# Patient Record
Sex: Female | Born: 1970 | Race: White | Hispanic: No | Smoking: Never smoker
Health system: Southern US, Community
[De-identification: ages and names within clinical notes are randomized; demographics above are authoritative.]

## PROBLEM LIST (undated history)

## (undated) DIAGNOSIS — R87629 Unspecified abnormal cytological findings in specimens from vagina: Secondary | ICD-10-CM

## (undated) DIAGNOSIS — G4730 Sleep apnea, unspecified: Secondary | ICD-10-CM

## (undated) DIAGNOSIS — J45909 Unspecified asthma, uncomplicated: Secondary | ICD-10-CM

## (undated) DIAGNOSIS — F419 Anxiety disorder, unspecified: Secondary | ICD-10-CM

## (undated) DIAGNOSIS — B20 Human immunodeficiency virus [HIV] disease: Secondary | ICD-10-CM

## (undated) DIAGNOSIS — D649 Anemia, unspecified: Secondary | ICD-10-CM

## (undated) DIAGNOSIS — F329 Major depressive disorder, single episode, unspecified: Secondary | ICD-10-CM

## (undated) DIAGNOSIS — A6000 Herpesviral infection of urogenital system, unspecified: Secondary | ICD-10-CM

## (undated) DIAGNOSIS — K219 Gastro-esophageal reflux disease without esophagitis: Secondary | ICD-10-CM

## (undated) DIAGNOSIS — I10 Essential (primary) hypertension: Secondary | ICD-10-CM

## (undated) DIAGNOSIS — F32A Depression, unspecified: Secondary | ICD-10-CM

## (undated) DIAGNOSIS — F1910 Other psychoactive substance abuse, uncomplicated: Secondary | ICD-10-CM

## (undated) HISTORY — DX: Essential (primary) hypertension: I10

## (undated) HISTORY — DX: Major depressive disorder, single episode, unspecified: F32.9

## (undated) HISTORY — DX: Gastro-esophageal reflux disease without esophagitis: K21.9

## (undated) HISTORY — DX: Unspecified abnormal cytological findings in specimens from vagina: R87.629

## (undated) HISTORY — DX: Other psychoactive substance abuse, uncomplicated: F19.10

## (undated) HISTORY — DX: Anemia, unspecified: D64.9

## (undated) HISTORY — DX: Unspecified asthma, uncomplicated: J45.909

## (undated) HISTORY — DX: Herpesviral infection of urogenital system, unspecified: A60.00

## (undated) HISTORY — DX: Sleep apnea, unspecified: G47.30

## (undated) HISTORY — PX: ABDOMINAL HYSTERECTOMY: SHX81

## (undated) HISTORY — DX: Anxiety disorder, unspecified: F41.9

## (undated) HISTORY — DX: Depression, unspecified: F32.A

## (undated) HISTORY — DX: Human immunodeficiency virus (HIV) disease: B20

---

## 2006-03-01 DIAGNOSIS — B20 Human immunodeficiency virus [HIV] disease: Secondary | ICD-10-CM

## 2006-03-01 DIAGNOSIS — Z21 Asymptomatic human immunodeficiency virus [HIV] infection status: Secondary | ICD-10-CM

## 2006-03-01 HISTORY — DX: Asymptomatic human immunodeficiency virus (hiv) infection status: Z21

## 2006-03-01 HISTORY — DX: Human immunodeficiency virus (HIV) disease: B20

## 2015-03-02 HISTORY — PX: HERNIA REPAIR: SHX51

## 2016-04-05 ENCOUNTER — Encounter: Payer: Self-pay | Admitting: Family Medicine

## 2016-04-05 ENCOUNTER — Ambulatory Visit (INDEPENDENT_AMBULATORY_CARE_PROVIDER_SITE_OTHER): Payer: Managed Care, Other (non HMO) | Admitting: Family Medicine

## 2016-04-05 VITALS — BP 122/82 | HR 88 | Temp 97.9°F | Resp 20 | Ht 62.75 in | Wt 187.0 lb

## 2016-04-05 DIAGNOSIS — A6004 Herpesviral vulvovaginitis: Secondary | ICD-10-CM | POA: Diagnosis not present

## 2016-04-05 DIAGNOSIS — B20 Human immunodeficiency virus [HIV] disease: Secondary | ICD-10-CM

## 2016-04-05 DIAGNOSIS — I10 Essential (primary) hypertension: Secondary | ICD-10-CM | POA: Diagnosis not present

## 2016-04-05 DIAGNOSIS — Z7689 Persons encountering health services in other specified circumstances: Secondary | ICD-10-CM

## 2016-04-05 DIAGNOSIS — J449 Chronic obstructive pulmonary disease, unspecified: Secondary | ICD-10-CM

## 2016-04-05 DIAGNOSIS — K219 Gastro-esophageal reflux disease without esophagitis: Secondary | ICD-10-CM | POA: Diagnosis not present

## 2016-04-05 DIAGNOSIS — F5104 Psychophysiologic insomnia: Secondary | ICD-10-CM | POA: Insufficient documentation

## 2016-04-05 DIAGNOSIS — F339 Major depressive disorder, recurrent, unspecified: Secondary | ICD-10-CM | POA: Insufficient documentation

## 2016-04-05 HISTORY — DX: Chronic obstructive pulmonary disease, unspecified: J44.9

## 2016-04-05 NOTE — Patient Instructions (Addendum)
Need old records Need to refer to infectious disease Need to refer to psychiatry Walk every day See me in a month

## 2016-04-05 NOTE — Progress Notes (Signed)
Chief Complaint  Patient presents with  . Establish Care   Patient is new to establish. Moved to the [place] area 2 weeks ago. No old records available.  Patient is HIV positive. 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 She states that she has gone through rehabilitation and has not had any cocaine or illicit drugs for 7 years. She had a hernia surgery repair in October 2017, and was not prescribed her antiviral medications upon discharge. Even though she knows she needs to stay on these medicines, she has not had her Complera since that time. She states because of stress with her marriage, and she is recently left her husband. She agrees to see an infectious disease specialist and be placed back on her medication at this time. She has a history of depression and anxiety. She takes Lexapro 10 mg a day. She states she also has prescribed Xanax that she tries not to take. I explained to her that Xanax was risky medication for a recovering addict and advise her not to take this. She also states she takes Ambien, 20 mg at night to help her sleep. I have advised her to discontinue this medication as well. She states that she continues to have depression and anxiety, and difficulty with her recovery. I'm going to refer her to psychiatry for care. She is on Norvasc 10 mg a day for blood pressure. Her blood pressure is well controlled. She states she does not have any hyperlipidemia or heart disease. She has never been a smoker. She states that she has GERD. She takes a lot of antacids. She has never been prescribed an ulcer or acid reducing medication. She takes Zovirax daily to suppress genital herpes.  She has had one mammogram in years past. She refuses to have another. She is up-to-date with her immunizations, per patient, has had flu, pneumonia shot, and tetanus booster. Unknown hepatitis B status. I will request her old  records.  Patient Active Problem List   Diagnosis Date Noted  . HIV (human immunodeficiency virus infection) ([place]) 04/05/2016  . Essential hypertension 04/05/2016  . GERD without esophagitis 04/05/2016  . Depression, recurrent ([place]) 04/05/2016  . Chronic insomnia 04/05/2016    Outpatient Encounter Prescriptions as of 04/05/2016  Medication Sig  . acyclovir (ZOVIRAX) 800 MG tablet Take 800 mg by mouth daily.  Marland Kitchen amLODipine (NORVASC) 10 MG tablet Take 10 mg by mouth daily.  Marland Kitchen escitalopram (LEXAPRO) 10 MG tablet Take 10 mg by mouth daily.  Marland Kitchen ibuprofen (ADVIL,MOTRIN) 800 MG tablet Take 800 mg by mouth every 8 (eight) hours as needed.  . zolpidem (AMBIEN) 10 MG tablet Take 20 mg by mouth at bedtime as needed for sleep.  Marland Kitchen emtricitabine-rilpivir-tenofovir DF (COMPLERA) 200-25-300 MG tablet Take 1 tablet by mouth daily.   No facility-administered encounter medications on file as of 04/05/2016.     Past Medical History:  Diagnosis Date  . Anemia   . Anxiety   . Asthma   . Depression   . GERD (gastroesophageal reflux disease)   . HIV infection ([place]) 2008  . Hypertension   . Sleep apnea   . 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 ABDOMINAL HYSTERECTOMY     BENIGN MASS  . HERNIA REPAIR  2017    Social History   Social History  . Marital status: Married  Spouse name: Legrand Como  . Number of children: 4  . Years of education: 8   Occupational History  . [employer]     waitress   Social History Main Topics  . Smoking status: Never Smoker  . Smokeless tobacco: Never Used  . Alcohol use 0.6 oz/week    1 Cans of beer per week     Comment: occ.  . Drug use: No     Comment: none in 7 years  . Sexual activity: Not Currently    Birth control/ protection: Surgical   Other Topics Concern  . Not on file   Social History Narrative   Recently separated   Here with 3 children of 4   8th grade Ed   HIV positive, readily  admits it was from prostitution to support cocaine habit   Normally works as a Educational psychologist    Family History  Problem Relation Age of Onset  . Depression Mother   . Hyperlipidemia Mother   . Learning disabilities Mother   . Alcohol abuse Mother 56  . Alcohol abuse Father   . Arthritis Father   . Cancer Father   . Heart disease Father   . Hypertension Father   . Learning disabilities Father   . Alcohol abuse Sister     RECOVERED  . Alcohol abuse Brother   . Alcohol abuse Brother   . Alcohol abuse Brother     Review of Systems  Constitutional: Negative for chills, fever and weight loss.  HENT: Negative for congestion and hearing loss.   Eyes: Negative for blurred vision and pain.  Respiratory: Negative for cough and shortness of breath.   Cardiovascular: Negative for chest pain and leg swelling.  Gastrointestinal: Positive for heartburn. Negative for abdominal pain, constipation and diarrhea.  Genitourinary: Negative for dysuria and frequency.  Musculoskeletal: Negative for falls, joint pain and myalgias.  Neurological: Negative for dizziness, seizures and headaches.  Psychiatric/Behavioral: Positive for depression and substance abuse. Negative for suicidal ideas. The patient has insomnia.     BP 122/82 (BP Location: Left Arm, Patient Position: Sitting, Cuff Size: Normal)   Pulse 88   Temp 97.9 F (36.6 C) (Oral)   Resp 20   Ht 5' 2.75" (1.594 m)   Wt 187 lb 0.6 oz (84.8 kg)   LMP 09/30/2014 (Approximate)   SpO2 98%   BMI 33.40 kg/m   Physical Exam  Constitutional: She is oriented to person, place, and time. She appears well-developed and well-nourished.  Overweight. Looks older than stated age.  HENT:  Head: Normocephalic and atraumatic.  Right Ear: External ear normal.  Left Ear: External ear normal.  Mouth/Throat: Oropharynx is clear and moist.  Dentition well restored  Eyes: Conjunctivae are normal. Pupils are equal, round, and reactive to light.  Neck: Normal  range of motion. Neck supple. No thyromegaly present.  Cardiovascular: Normal rate, regular rhythm and normal heart sounds.   Pulmonary/Chest: Effort normal and breath sounds normal. No respiratory distress.  Abdominal: Soft. Bowel sounds are normal.  Well-healed abdominal scar. Upper portion of the incision has 2 superficial excoriations. Had a discussion with patient about picking these, keeping them covered  Musculoskeletal: Normal range of motion. She exhibits no edema.  Lymphadenopathy:    She has no cervical adenopathy.  Neurological: She is alert and oriented to person, place, and time.  Gait normal  Skin: Skin is warm and dry.  Psychiatric: She has a normal mood and affect. Her behavior is normal. Thought content normal.  Nursing  note and vitals reviewed. ASSESSMENT/PLAN:   1. HIV (human immunodeficiency virus infection) ([place])  - Ambulatory referral to Infectious Disease  2. Essential hypertension   3. GERD without esophagitis   4. Depression, recurrent ([place])  - Ambulatory referral to Psychiatry  5. Herpes simplex vulvovaginitis   6. Chronic insomnia  - Ambulatory referral to Psychiatry  7. Encounter to establish care with new doctor    Patient Instructions  Need old records Need to refer to infectious disease Need to refer to psychiatry Walk every day See me in a month    Raylene Everts, MD

## 2016-04-15 ENCOUNTER — Encounter: Payer: Self-pay | Admitting: Family Medicine

## 2016-04-15 ENCOUNTER — Other Ambulatory Visit: Payer: Self-pay | Admitting: Family Medicine

## 2016-04-15 DIAGNOSIS — J45909 Unspecified asthma, uncomplicated: Secondary | ICD-10-CM | POA: Insufficient documentation

## 2016-04-15 DIAGNOSIS — G4733 Obstructive sleep apnea (adult) (pediatric): Secondary | ICD-10-CM | POA: Insufficient documentation

## 2016-04-15 DIAGNOSIS — F1910 Other psychoactive substance abuse, uncomplicated: Secondary | ICD-10-CM | POA: Insufficient documentation

## 2016-05-03 ENCOUNTER — Ambulatory Visit: Payer: Managed Care, Other (non HMO) | Admitting: Family Medicine

## 2016-05-04 ENCOUNTER — Encounter: Payer: Self-pay | Admitting: Family Medicine

## 2016-05-04 ENCOUNTER — Ambulatory Visit (INDEPENDENT_AMBULATORY_CARE_PROVIDER_SITE_OTHER): Payer: Managed Care, Other (non HMO) | Admitting: Family Medicine

## 2016-05-04 ENCOUNTER — Other Ambulatory Visit: Payer: Self-pay | Admitting: Family Medicine

## 2016-05-04 VITALS — BP 116/80 | HR 88 | Temp 98.9°F | Resp 20 | Ht 62.75 in | Wt 189.0 lb

## 2016-05-04 DIAGNOSIS — F5104 Psychophysiologic insomnia: Secondary | ICD-10-CM | POA: Diagnosis not present

## 2016-05-04 DIAGNOSIS — K219 Gastro-esophageal reflux disease without esophagitis: Secondary | ICD-10-CM

## 2016-05-04 DIAGNOSIS — D171 Benign lipomatous neoplasm of skin and subcutaneous tissue of trunk: Secondary | ICD-10-CM | POA: Diagnosis not present

## 2016-05-04 DIAGNOSIS — N3281 Overactive bladder: Secondary | ICD-10-CM | POA: Diagnosis not present

## 2016-05-04 DIAGNOSIS — M5431 Sciatica, right side: Secondary | ICD-10-CM

## 2016-05-04 MED ORDER — ESCITALOPRAM OXALATE 10 MG PO TABS: 10.0000 mg | ORAL_TABLET | Freq: Every day | ORAL | 1 refills | Status: DC

## 2016-05-04 MED ORDER — PANTOPRAZOLE SODIUM 40 MG PO TBEC: 40.0000 mg | DELAYED_RELEASE_TABLET | Freq: Every day | ORAL | 3 refills | Status: DC

## 2016-05-04 MED ORDER — OXYBUTYNIN CHLORIDE ER 10 MG PO TB24: 10.0000 mg | ORAL_TABLET | Freq: Every day | ORAL | 1 refills | Status: DC

## 2016-05-04 MED ORDER — TRAZODONE HCL 100 MG PO TABS: 100.0000 mg | ORAL_TABLET | Freq: Every day | ORAL | 1 refills | Status: DC

## 2016-05-04 NOTE — Patient Instructions (Addendum)
Take protonix daily to reduce the heartburn Take oxybutynin daily for the bladder control  I have placed order for back and hip x ray  I have placed order for surgery consult for the lipoma  Take trazodone to sleep Take the lexapro daily  Walk every day that you are able  See me in a month

## 2016-05-04 NOTE — Progress Notes (Signed)
Chief Complaint  Patient presents with  . Follow-up    1 month  here for routine follow up Brings a list: 1. Patient has discontinued her Ambien. She is taking trazodone at nighttime. This is something she previously took, and found an old bottle of it. It works well. I will refill this for her. 2. Patient has a fatty tumor on her buttock that she would like to have removed. I explained to her that this would leave a large scar. I don't think it's that noticeable. The patient is adamant that she does not like this lump. I'll refer her for a consultation. 3. Patient has an overactive bladder. She is currently not on medication. She would like to try a pill to reduce her bladder frequency. We discussed Koegel exercises. 4. Patient has heartburn. She is taking ranitidine over-the-counter. She would like to try something stronger. She is given a prescription for Protonix 40 mg a day. 5. Patient states that she has chronic tinnitus. We discussed that this is not something that usually is treatable, although I did discuss measures to reduce the annoyance such as [place] noise at night. 6. Patient states she fell down several steps a couple of years ago. She's had chronic pain in her right buttock that goes into her right leg. She describes sciatica. She has never had this worked up. No numbness or weakness. She has an appointment scheduled with infection disease. She has not yet heard from psychiatry. I did place this referral. She is asked to stop by the office and see about setting up an appointment She states that she is settling into her new home. She went to the [facility] and adopted 2 dogs. She is walking every day for exercise.    Patient Active Problem List   Diagnosis Date Noted  . OSA (obstructive sleep apnea) 04/15/2016  . Substance abuse 04/15/2016  . Asthma 04/15/2016  . HIV (human immunodeficiency virus infection) ([place]) 04/05/2016  . Essential hypertension 04/05/2016  . GERD without  esophagitis 04/05/2016  . Depression, recurrent ([place]) 04/05/2016  . Chronic insomnia 04/05/2016    Outpatient Encounter Prescriptions as of 05/04/2016  Medication Sig  . acyclovir (ZOVIRAX) 800 MG tablet Take 800 mg by mouth daily.  Marland Kitchen amLODipine (NORVASC) 10 MG tablet Take 10 mg by mouth daily.  Marland Kitchen emtricitabine-rilpivir-tenofovir DF (COMPLERA) 200-25-300 MG tablet Take 1 tablet by mouth daily.  Marland Kitchen escitalopram (LEXAPRO) 10 MG tablet Take 1 tablet (10 mg total) by mouth daily.  Marland Kitchen ibuprofen (ADVIL,MOTRIN) 800 MG tablet Take 800 mg by mouth every 8 (eight) hours as needed.  Marland Kitchen oxybutynin (DITROPAN-XL) 10 MG 24 hr tablet Take 1 tablet (10 mg total) by mouth at bedtime.  . pantoprazole (PROTONIX) 40 MG tablet Take 1 tablet (40 mg total) by mouth daily.  . traZODone (DESYREL) 100 MG tablet Take 1 tablet (100 mg total) by mouth at bedtime.   No facility-administered encounter medications on file as of 05/04/2016.     Allergies  Allergen Reactions  . Abilify [Aripiprazole] Other (See Comments)    Tardive dyskinesia    Review of Systems  Constitutional: Negative for chills, fever and unexpected weight change.  HENT: Positive for tinnitus. Negative for congestion and hearing loss.   Eyes: Negative for visual disturbance.  Respiratory: Negative for cough and shortness of breath.   Cardiovascular: Negative for chest pain.  Gastrointestinal: Positive for abdominal pain. Negative for blood in stool, constipation and diarrhea.       Acid  reflux  Genitourinary: Negative for difficulty urinating and menstrual problem.       Incontinence  Musculoskeletal: Positive for arthralgias, back pain and gait problem.  Skin:       "lump" left buttock  Neurological: Negative for dizziness and headaches.  Psychiatric/Behavioral: Positive for sleep disturbance. Negative for dysphoric mood. The patient is not nervous/anxious.     BP 116/80 (BP Location: Right Arm, Patient Position: Sitting, Cuff Size: Normal)    Pulse 88   Temp 98.9 F (37.2 C) (Temporal)   Resp 20   Ht 5' 2.75" (1.594 m)   Wt 189 lb 0.6 oz (85.7 kg)   LMP 09/30/2014 (Approximate)   SpO2 97%   BMI 33.75 kg/m   Physical Exam  Constitutional: She is oriented to person, place, and time. She appears well-developed and well-nourished.  Mold overweight  HENT:  Head: Normocephalic and atraumatic.  Right Ear: External ear normal.  Left Ear: External ear normal.  Mouth/Throat: Oropharynx is clear and moist.  Eyes: Conjunctivae are normal. Pupils are equal, round, and reactive to light.  Neck: Normal range of motion. Neck supple. No thyromegaly present.  Cardiovascular: Normal rate, regular rhythm and normal heart sounds.   Pulmonary/Chest: Effort normal and breath sounds normal. No respiratory distress.  Abdominal: Soft. Bowel sounds are normal.  Musculoskeletal: Normal range of motion. She exhibits no edema.  Lumbar spine is straight and symmetric. Full range of motion. No tenderness or muscle spasm. Strength, sensation, range of motion, and reflexes are normal in both lower extremities. Straight leg raise is negative bilateral. Left buttock with large soft tissue soft mass  Lymphadenopathy:    She has no cervical adenopathy.  Neurological: She is alert and oriented to person, place, and time.  Gait normal  Skin: Skin is warm and dry.  Psychiatric: She has a normal mood and affect. Her behavior is normal. Thought content normal.  Happy, talkative  Nursing note and vitals reviewed.   ASSESSMENT/PLAN:  1. Lipoma of buttock  - Ambulatory referral to General Surgery  2. OAB (overactive bladder)   3. Chronic insomnia   4. GERD without esophagitis   5. Right sided sciatica  - DG Lumbar Spine 2-3 Views; Future   Patient Instructions  Take protonix daily to reduce the heartburn Take oxybutynin daily for the bladder control  I have placed order for back and hip x ray  I have placed order for surgery consult for  the lipoma  Take trazodone to sleep Take the lexapro daily  Walk every day that you are able  See me in a month   Raylene Everts, MD

## 2016-05-04 NOTE — Telephone Encounter (Signed)
Patient states that her acyclovir (ZOVIRAX) 800 MG tablet wasn't called to the pharmacy please advise?

## 2016-05-04 NOTE — Telephone Encounter (Signed)
Also missing the High Blood Pressure Med

## 2016-05-05 MED ORDER — ACYCLOVIR 800 MG PO TABS: 800.0000 mg | ORAL_TABLET | Freq: Every day | ORAL | 1 refills | Status: DC

## 2016-05-05 MED ORDER — AMLODIPINE BESYLATE 10 MG PO TABS: 10.0000 mg | ORAL_TABLET | Freq: Every day | ORAL | 3 refills | Status: DC

## 2016-05-17 ENCOUNTER — Other Ambulatory Visit: Payer: Managed Care, Other (non HMO)

## 2016-05-17 ENCOUNTER — Other Ambulatory Visit: Payer: Self-pay | Admitting: Internal Medicine

## 2016-05-17 DIAGNOSIS — Z113 Encounter for screening for infections with a predominantly sexual mode of transmission: Secondary | ICD-10-CM

## 2016-05-17 DIAGNOSIS — B20 Human immunodeficiency virus [HIV] disease: Secondary | ICD-10-CM

## 2016-05-17 DIAGNOSIS — Z79899 Other long term (current) drug therapy: Secondary | ICD-10-CM

## 2016-05-17 LAB — LIPID PANEL
Cholesterol: 190 mg/dL (ref <200)
HDL: 45 mg/dL — ABNORMAL LOW (ref >50)
LDL CALC: 99 mg/dL (ref <100)
TRIGLYCERIDES: 231 mg/dL — AB (ref <150)
Total CHOL/HDL Ratio: 4.2 Ratio (ref <5.0)
VLDL: 46 mg/dL — ABNORMAL HIGH (ref <30)

## 2016-05-17 LAB — COMPLETE METABOLIC PANEL WITH GFR
ALT: 60 U/L — ABNORMAL HIGH (ref 6–29)
AST: 32 U/L — AB (ref 10–30)
Albumin: 4.6 g/dL (ref 3.6–5.1)
Alkaline Phosphatase: 57 U/L (ref 33–115)
BUN: 12 mg/dL (ref 7–25)
CALCIUM: 9.6 mg/dL (ref 8.6–10.2)
CHLORIDE: 104 mmol/L (ref 98–110)
CO2: 23 mmol/L (ref 20–31)
Creat: 0.59 mg/dL (ref 0.50–1.10)
GFR, Est African American: >89 mL/min (ref >=60)
GFR, Est Non African American: >89 mL/min (ref >=60)
Glucose, Bld: 81 mg/dL (ref 65–99)
POTASSIUM: 3.9 mmol/L (ref 3.5–5.3)
Sodium: 139 mmol/L (ref 135–146)
Total Bilirubin: 0.6 mg/dL (ref 0.2–1.2)
Total Protein: 8.1 g/dL (ref 6.1–8.1)

## 2016-05-17 LAB — CBC WITH DIFFERENTIAL/PLATELET
BASOS PCT: 1 %
Basophils Absolute: 84 cells/uL (ref 0–200)
EOS PCT: 2 %
Eosinophils Absolute: 168 cells/uL (ref 15–500)
HEMATOCRIT: 39.4 % (ref 35.0–45.0)
Hemoglobin: 13.3 g/dL (ref 11.7–15.5)
LYMPHS PCT: 29 %
Lymphs Abs: 2436 cells/uL (ref 850–3900)
MCH: 29.5 pg (ref 27.0–33.0)
MCHC: 33.8 g/dL (ref 32.0–36.0)
MCV: 87.4 fL (ref 80.0–100.0)
MONO ABS: 672 {cells}/uL (ref 200–950)
MONOS PCT: 8 %
MPV: 10.6 fL (ref 7.5–12.5)
NEUTROS PCT: 60 %
Neutro Abs: 5040 cells/uL (ref 1500–7800)
Platelets: 252 10*3/uL (ref 140–400)
RBC: 4.51 MIL/uL (ref 3.80–5.10)
RDW: 15.6 % — AB (ref 11.0–15.0)
WBC: 8.4 10*3/uL (ref 3.8–10.8)

## 2016-05-17 LAB — URINALYSIS, ROUTINE W REFLEX MICROSCOPIC
BILIRUBIN URINE: NEGATIVE
Glucose, UA: NEGATIVE mg/dL
Hgb urine dipstick: NEGATIVE
Ketones, ur: NEGATIVE mg/dL
Leukocytes, UA: NEGATIVE
NITRITE: NEGATIVE
PH: 6 (ref 5.0–8.0)
Protein, ur: NEGATIVE mg/dL
SPECIFIC GRAVITY, URINE: 1.006 (ref 1.005–1.030)

## 2016-05-18 LAB — HEPATITIS A ANTIBODY, TOTAL: Hep A Total Ab: REACTIVE — AB

## 2016-05-18 LAB — HEPATITIS C ANTIBODY: HCV Ab: NEGATIVE

## 2016-05-18 LAB — T-HELPER CELL (CD4) - (RCID CLINIC ONLY)
CD4 T CELL HELPER: 26 % — AB (ref 33–55)
CD4 T Cell Abs: 580 /uL (ref 400–2700)

## 2016-05-18 LAB — RPR

## 2016-05-18 LAB — URINE CYTOLOGY ANCILLARY ONLY
Chlamydia: NEGATIVE
Neisseria Gonorrhea: NEGATIVE

## 2016-05-18 LAB — HEPATITIS B SURFACE ANTIBODY,QUALITATIVE: Hep B S Ab: NEGATIVE

## 2016-05-18 LAB — HEPATITIS B CORE ANTIBODY, TOTAL: HEP B C TOTAL AB: NONREACTIVE

## 2016-05-18 LAB — HEPATITIS B SURFACE ANTIGEN: HEP B S AG: NEGATIVE

## 2016-05-19 LAB — HIV-1 RNA,QN PCR W/REFLEX GENOTYPE
HIV-1 RNA, QN PCR: 4.14 {Log_copies}/mL — AB
HIV-1 RNA, QN PCR: 13800 Copies/mL — ABNORMAL HIGH

## 2016-05-19 LAB — QUANTIFERON TB GOLD ASSAY (BLOOD)
Interferon Gamma Release Assay: NEGATIVE
Mitogen-Nil: >10 IU/mL
QUANTIFERON NIL VALUE: 0.08 [IU]/mL
Quantiferon Tb Ag Minus Nil Value: 0 IU/mL

## 2016-05-24 LAB — HLA B*5701: HLA-B*5701 w/rflx HLA-B High: NEGATIVE

## 2016-05-25 LAB — HIV-1 GENOTYPR PLUS

## 2016-05-31 ENCOUNTER — Encounter: Payer: Self-pay | Admitting: Internal Medicine

## 2016-05-31 ENCOUNTER — Encounter: Payer: Self-pay | Admitting: Licensed Clinical Social Worker

## 2016-05-31 ENCOUNTER — Ambulatory Visit (INDEPENDENT_AMBULATORY_CARE_PROVIDER_SITE_OTHER): Payer: Managed Care, Other (non HMO) | Admitting: Internal Medicine

## 2016-05-31 VITALS — BP 125/89 | HR 81 | Temp 98.2°F | Ht 64.0 in | Wt 187.0 lb

## 2016-05-31 DIAGNOSIS — Z23 Encounter for immunization: Secondary | ICD-10-CM

## 2016-05-31 DIAGNOSIS — B20 Human immunodeficiency virus [HIV] disease: Secondary | ICD-10-CM | POA: Diagnosis not present

## 2016-05-31 DIAGNOSIS — Z21 Asymptomatic human immunodeficiency virus [HIV] infection status: Secondary | ICD-10-CM

## 2016-05-31 MED ORDER — ELVITEG-COBIC-EMTRICIT-TENOFAF 150-150-200-10 MG PO TABS: 1.0000 | ORAL_TABLET | Freq: Every day | ORAL | 5 refills | Status: DC

## 2016-05-31 MED ORDER — IBUPROFEN 800 MG PO TABS: 800.0000 mg | ORAL_TABLET | Freq: Three times a day (TID) | ORAL | 2 refills | Status: DC | PRN

## 2016-05-31 NOTE — Progress Notes (Signed)
Find out what happened with her psych consult , please

## 2016-05-31 NOTE — Progress Notes (Signed)
Patient ID: Chelsey Henson, female    DOB: [birth year], 46 y.o.   MRN: [id number]  Reason for visit: to establish care as a new patient with HIV  HPI:   Patient was first diagnosed 10 years ago.  He was tested as part screening. The CD4 count is 580, viral load 13,800.  She has been on Complera but ran out and has been off.  REcent genotype wild type.  She has a history of stopping and restarting twice.  She understands the importance of compliance and development of resistance.  There have been no associated symptoms including no n/v/d.  She recently started a ppi.  She does not recall her last pneumovax or other vaccines and no records available.  Last in care in [place].  History of crack cocaine use, prostitution.  No sex for over 5 years.  Married and husband in is [place].  Here with kids.  Is waiting to get into psychiatry but has not heard back from her PCP.  Remains drug free since October 2010.     Past Medical History:  Diagnosis Date  . Anemia   . Anxiety   . Asthma   . Depression   . Genital herpes   . GERD (gastroesophageal reflux disease)   . HIV infection ([place]) 2008  . Hypertension   . Sleep apnea   . 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 Authorizing Provider  acyclovir (ZOVIRAX) 800 MG tablet Take 1 tablet (800 mg total) by mouth daily. 05/05/16  Yes Raylene Everts, MD  amLODipine (NORVASC) 10 MG tablet Take 1 tablet (10 mg total) by mouth daily. 05/05/16  Yes Raylene Everts, MD  escitalopram (LEXAPRO) 10 MG tablet Take 1 tablet (10 mg total) by mouth daily. 05/04/16  Yes Raylene Everts, MD  ibuprofen (ADVIL,MOTRIN) 800 MG tablet Take 1 tablet (800 mg total) by mouth every 8 (eight) hours as needed. 05/31/16  Yes Thayer Headings, MD  oxybutynin (DITROPAN-XL) 10 MG 24 hr tablet Take 1 tablet (10 mg total) by mouth at bedtime. 05/04/16  Yes Raylene Everts, MD  pantoprazole (PROTONIX) 40 MG tablet  Take 1 tablet (40 mg total) by mouth daily. 05/04/16  Yes Raylene Everts, MD  traZODone (DESYREL) 100 MG tablet Take 1 tablet (100 mg total) by mouth at bedtime. 05/04/16  Yes Raylene Everts, MD  elvitegravir-cobicistat-emtricitabine-tenofovir (GENVOYA) 150-150-200-10 MG TABS tablet Take 1 tablet by mouth daily. 05/31/16   Thayer Headings, MD    Allergies  Allergen Reactions  . Abilify [Aripiprazole] Other (See Comments)    Tardive dyskinesia    Social History  Substance Use Topics  . Smoking status: Never Smoker  . Smokeless tobacco: Never Used  . Alcohol use 0.6 oz/week    1 Cans of beer per week     Comment: occ.    Family History  Problem Relation Age of Onset  . Depression Mother   . Hyperlipidemia Mother   . Learning disabilities Mother   . Alcohol abuse Mother 60  . Alcohol abuse Father   . Arthritis Father   . Cancer Father   . Heart disease Father   . Hypertension Father   . Learning disabilities Father   . Alcohol abuse Sister     RECOVERED  . Alcohol abuse Brother   . Alcohol abuse Brother   . Alcohol abuse Brother  Review of Systems Constitutional: negative for fatigue and malaise Integument/breast: negative for rash Musculoskeletal: negative for myalgias and arthralgias All other systems reviewed and are negative    CONSTITUTIONAL:in no apparent distress and alert  Vitals:   05/31/16 0849  BP: 125/89  Pulse: 81  Temp: 98.2 F (36.8 C)   EYES: anicteric HENT: no thrush CARD:Cor RRR RESP:CTA B; normal respiratory effort AF:BXUXY sounds are normal, liver is not enlarged, spleen is not enlarged MS:no pedal edema noted SKIN: no rash NEURO: non-focal  Assessment: new patient here with established HIV.  Discussed with patient treatment options and side effects, benefits of treatment, long term outcomes.  I discussed the severity of untreated HIV including higher cancer risk, opportunistic infections, renal failure.  Also discussed needing to use  condoms, partner disclosure, necessary vaccines, blood monitoring.  I discussed resistance and need to take daily.  Also interaction with ppi and rilpivirine.  All questions answered.    Plan: 1) start Genvoya, copay card given 2) labs in 4 weeks 3) I refilled her ibuprofen 4) case management with [facility]

## 2016-06-03 ENCOUNTER — Telehealth: Payer: Self-pay | Admitting: Pharmacist

## 2016-06-03 NOTE — Telephone Encounter (Signed)
Chelsey Henson called and is complaining of nausea, vomiting, intense muscle aches, headaches, and weakness.  She says she thinks it is from [place].  I told her it likely isn't, and she should go to her PCP or urgent care to get tested for the flu.  She said she will do that.  Told her to call back tomorrow or Monday if she isn't better and we can discuss switching medications at that time.

## 2016-06-15 ENCOUNTER — Ambulatory Visit: Payer: Managed Care, Other (non HMO) | Admitting: General Surgery

## 2016-06-28 ENCOUNTER — Other Ambulatory Visit: Payer: Managed Care, Other (non HMO)

## 2016-06-28 DIAGNOSIS — B20 Human immunodeficiency virus [HIV] disease: Secondary | ICD-10-CM

## 2016-06-28 LAB — CMP 10231
AG RATIO: 1.1 ratio (ref 1.0–2.5)
ALK PHOS: 62 U/L (ref 33–115)
ALT: 60 U/L — ABNORMAL HIGH (ref 6–29)
AST: 31 U/L — AB (ref 10–30)
Albumin: 4.4 g/dL (ref 3.6–5.1)
BUN / CREAT RATIO: 18.2 ratio (ref 6–22)
BUN: 14 mg/dL (ref 7–25)
CHLORIDE: 104 mmol/L (ref 98–110)
CO2: 24 mmol/L (ref 20–31)
Calcium: 9.5 mg/dL (ref 8.6–10.2)
Creat: 0.77 mg/dL (ref 0.50–1.10)
GFR, Est Non African American: >89 mL/min (ref >=60)
Globulin: 3.9 g/dL — ABNORMAL HIGH (ref 1.9–3.7)
Glucose, Bld: 90 mg/dL (ref 65–99)
POTASSIUM: 4.2 mmol/L (ref 3.5–5.3)
SODIUM: 138 mmol/L (ref 135–146)
Total Bilirubin: 0.5 mg/dL (ref 0.2–1.2)
Total Protein: 8.3 g/dL — ABNORMAL HIGH (ref 6.1–8.1)

## 2016-06-28 LAB — CBC WITH DIFFERENTIAL/PLATELET
BASOS PCT: 1 %
Basophils Absolute: 65 cells/uL (ref 0–200)
Eosinophils Absolute: 130 cells/uL (ref 15–500)
Eosinophils Relative: 2 %
HEMATOCRIT: 40.4 % (ref 35.0–45.0)
HEMOGLOBIN: 13.5 g/dL (ref 11.7–15.5)
LYMPHS ABS: 2145 {cells}/uL (ref 850–3900)
Lymphocytes Relative: 33 %
MCH: 29.8 pg (ref 27.0–33.0)
MCHC: 33.4 g/dL (ref 32.0–36.0)
MCV: 89.2 fL (ref 80.0–100.0)
MONO ABS: 390 {cells}/uL (ref 200–950)
MPV: 10.6 fL (ref 7.5–12.5)
Monocytes Relative: 6 %
NEUTROS PCT: 58 %
Neutro Abs: 3770 cells/uL (ref 1500–7800)
Platelets: 245 10*3/uL (ref 140–400)
RBC: 4.53 MIL/uL (ref 3.80–5.10)
RDW: 15 % (ref 11.0–15.0)
WBC: 6.5 10*3/uL (ref 3.8–10.8)

## 2016-06-28 LAB — COMPLETE METABOLIC PANEL WITH GFR

## 2016-06-28 LAB — COMPREHENSIVE METABOLIC PANEL
AG Ratio: 1.1 Ratio (ref 1.0–2.5)
ALBUMIN: 4.4 g/dL (ref 3.6–5.1)
ALK PHOS: 62 U/L (ref 33–115)
ALT: 60 U/L — AB (ref 6–29)
AST: 31 U/L — AB (ref 10–30)
BILIRUBIN TOTAL: 0.5 mg/dL (ref 0.2–1.2)
BUN / CREAT RATIO: 18.2 ratio (ref 6–22)
BUN: 14 mg/dL (ref 7–25)
CALCIUM: 9.5 mg/dL (ref 8.6–10.2)
CO2: 24 mmol/L (ref 20–31)
CREATININE: 0.77 mg/dL (ref 0.50–1.10)
Chloride: 104 mmol/L (ref 98–110)
GFR, Est African American: >89 mL/min (ref >=60)
GFR, Est Non African American: >89 mL/min (ref >=60)
GLUCOSE: 90 mg/dL (ref 65–99)
Globulin: 3.9 g/dL — ABNORMAL HIGH (ref 1.9–3.7)
Potassium: 4.2 mmol/L (ref 3.5–5.3)
SODIUM: 138 mmol/L (ref 135–146)
TOTAL PROTEIN: 8.3 g/dL — AB (ref 6.1–8.1)

## 2016-06-29 LAB — T-HELPER CELL (CD4) - (RCID CLINIC ONLY)
CD4 % Helper T Cell: 27 % — ABNORMAL LOW (ref 33–55)
CD4 T Cell Abs: 670 /uL (ref 400–2700)

## 2016-06-30 LAB — HIV-1 RNA,QN PCR W/REFLEX GENOTYPE
HIV-1 RNA, QN PCR: 1.91 Log cps/mL — ABNORMAL HIGH
HIV-1 RNA, QN PCR: 81 Copies/mL — ABNORMAL HIGH

## 2016-07-06 ENCOUNTER — Ambulatory Visit: Payer: Managed Care, Other (non HMO) | Admitting: Internal Medicine

## 2016-07-10 ENCOUNTER — Encounter (HOSPITAL_COMMUNITY): Payer: Self-pay

## 2016-07-10 ENCOUNTER — Emergency Department (HOSPITAL_COMMUNITY)
Admission: EM | Admit: 2016-07-10 | Discharge: 2016-07-10 | Disposition: A | Discharge status: Home / Self Care | Payer: Medicaid Other | Attending: Emergency Medicine | Admitting: Emergency Medicine

## 2016-07-10 DIAGNOSIS — J45909 Unspecified asthma, uncomplicated: Secondary | ICD-10-CM | POA: Diagnosis not present

## 2016-07-10 DIAGNOSIS — Z21 Asymptomatic human immunodeficiency virus [HIV] infection status: Secondary | ICD-10-CM | POA: Insufficient documentation

## 2016-07-10 DIAGNOSIS — J0300 Acute streptococcal tonsillitis, unspecified: Secondary | ICD-10-CM | POA: Diagnosis not present

## 2016-07-10 DIAGNOSIS — R509 Fever, unspecified: Secondary | ICD-10-CM | POA: Diagnosis present

## 2016-07-10 DIAGNOSIS — Z79899 Other long term (current) drug therapy: Secondary | ICD-10-CM | POA: Insufficient documentation

## 2016-07-10 LAB — RAPID STREP SCREEN (MED CTR MEBANE ONLY): Streptococcus, Group A Screen (Direct): POSITIVE — AB

## 2016-07-10 MED ORDER — ONDANSETRON 8 MG PO TBDP
8.0000 mg | ORAL_TABLET | Freq: Once | ORAL | Status: AC
  Administered 2016-07-10: 8 mg via ORAL
  Filled 2016-07-10: qty 1

## 2016-07-10 MED ORDER — PENICILLIN G BENZATHINE 1200000 UNIT/2ML IM SUSP
1.2000 10*6.[IU] | Freq: Once | INTRAMUSCULAR | Status: AC
  Administered 2016-07-10: 1.2 10*6.[IU] via INTRAMUSCULAR
  Filled 2016-07-10: qty 2

## 2016-07-10 MED ORDER — ACETAMINOPHEN 325 MG PO TABS
650.0000 mg | ORAL_TABLET | Freq: Once | ORAL | Status: AC
  Administered 2016-07-10: 650 mg via ORAL
  Filled 2016-07-10: qty 2

## 2016-07-10 MED ORDER — DEXAMETHASONE 4 MG PO TABS
12.0000 mg | ORAL_TABLET | ORAL | Status: AC
  Administered 2016-07-10: 12 mg via ORAL
  Filled 2016-07-10: qty 3

## 2016-07-10 MED ORDER — OXYCODONE-ACETAMINOPHEN 5-325 MG PO TABS: 1.0000 | ORAL_TABLET | ORAL | 0 refills | Status: DC | PRN

## 2016-07-10 NOTE — ED Notes (Signed)
Pt alert & oriented x4, stable gait. Patient given discharge instructions, paperwork & prescription(s). Patient informed not to drive, operate any equipment & handel any important documents 4 hours after taking pain medication. Patient instructed to stop at the registration desk to finish any additional paperwork. Patient verbalized understanding. Pt left department w/ no further questions.

## 2016-07-10 NOTE — ED Triage Notes (Signed)
Pt reports fever, chills, sore throat since yesterday morning.  Reports unable to sleep and increased anxiety.  Reports n/v.  Denies diarrhea.

## 2016-07-10 NOTE — ED Notes (Addendum)
Pt states she thinks she has been running a fever, having chills. C/O throat pain & chronic back pain. Pt also adds she having some anxiety issues but not taking medication.

## 2016-07-10 NOTE — ED Provider Notes (Addendum)
[facility] DEPT Provider Note   CSN: [id number] Arrival date & time: 07/10/16  0403     History   Chief Complaint Chief Complaint  Patient presents with  . Fever    HPI Chelsey Henson is a 46 y.o. female.  She complains of fever and sore throat since yesterday. There is associated sinus pressure. Temperature is been a size 101. She has had chills and sweats. There is a nonproductive cough. She has also had nausea and vomiting, although nausea has subsided. She denies constipation or diarrhea. She denies abdominal pain or dysuria. She does have history of being HIV positive and is seeing an infectious disease specialist in [place]. At home, she has taken ibuprofen without any relief.   The history is provided by the patient.  Fever      Past Medical History:  Diagnosis Date  . Anemia   . Anxiety   . Asthma   . Depression   . Genital herpes   . GERD (gastroesophageal reflux disease)   . HIV infection ([place]) 2008  . Hypertension   . Sleep apnea   . 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 OSA (obstructive sleep apnea) 04/15/2016  . Substance abuse 04/15/2016  . Asthma 04/15/2016  . HIV (human immunodeficiency virus infection) ([place]) 04/05/2016  . Essential hypertension 04/05/2016  . GERD without esophagitis 04/05/2016  . Depression, recurrent ([place]) 04/05/2016  . Chronic insomnia 04/05/2016    Past Surgical History:  Procedure Laterality Date  . ABDOMINAL HYSTERECTOMY     BENIGN MASS  . HERNIA REPAIR  2017    OB History    No data available       Home Medications    Prior to Admission medications   Medication Sig Start Date End Date Taking? Authorizing Provider  acyclovir (ZOVIRAX) 800 MG tablet Take 1 tablet (800 mg total) by mouth daily. 05/05/16   Raylene Everts, MD  amLODipine (NORVASC) 10 MG tablet Take 1 tablet (10 mg total) by mouth daily. 05/05/16   Raylene Everts, MD    elvitegravir-cobicistat-emtricitabine-tenofovir (GENVOYA) 150-150-200-10 MG TABS tablet Take 1 tablet by mouth daily. 05/31/16   Thayer Headings, MD  escitalopram (LEXAPRO) 10 MG tablet Take 1 tablet (10 mg total) by mouth daily. 05/04/16   Raylene Everts, MD  ibuprofen (ADVIL,MOTRIN) 800 MG tablet Take 1 tablet (800 mg total) by mouth every 8 (eight) hours as needed. 05/31/16   Thayer Headings, MD  oxybutynin (DITROPAN-XL) 10 MG 24 hr tablet Take 1 tablet (10 mg total) by mouth at bedtime. 05/04/16   Raylene Everts, MD  pantoprazole (PROTONIX) 40 MG tablet Take 1 tablet (40 mg total) by mouth daily. 05/04/16   Raylene Everts, MD  traZODone (DESYREL) 100 MG tablet Take 1 tablet (100 mg total) by mouth at bedtime. 05/04/16   Raylene Everts, MD    Family History Family History  Problem Relation Age of Onset  . Depression Mother   . Hyperlipidemia Mother   . Learning disabilities Mother   . Alcohol abuse Mother 58  . Alcohol abuse Father   . Arthritis Father   . Cancer Father   . Heart disease Father   . Hypertension Father   . Learning disabilities Father   . Alcohol abuse Sister        RECOVERED  . Alcohol abuse Brother   . Alcohol abuse Brother   .  Alcohol abuse Brother     Social History Social History  Substance Use Topics  . Smoking status: Never Smoker  . Smokeless tobacco: Never Used  . Alcohol use 0.6 oz/week    1 Cans of beer per week     Comment: occ.     Allergies   Abilify [aripiprazole]   Review of Systems Review of Systems  Constitutional: Positive for fever.  All other systems reviewed and are negative.    Physical Exam Updated Vital Signs BP 109/74 (BP Location: Left Arm)   Pulse (!) 128   Temp 99.5 F (37.5 C) (Oral)   Resp 19   Ht 5' 4"  (1.626 m)   Wt 198 lb (89.8 kg)   LMP 09/30/2014 (Approximate)   SpO2 93%   BMI 33.99 kg/m   Physical Exam  Nursing note and vitals reviewed.  46 year old female, resting comfortably and in no  acute distress. Vital signs are significant for tachycardia. Oxygen saturation is 93%, which is normal. Head is normocephalic and atraumatic. PERRLA, EOMI. Oropharynx shows mild tonsillar hypertrophy with faint exudate present. There is marked bilateral maxillary sinus tenderness. Neck is nontender and supple without adenopathy or JVD. Back is nontender and there is no CVA tenderness. Lungs are clear without rales, wheezes, or rhonchi. Chest is nontender. Heart has regular rate and rhythm without murmur. Abdomen is soft, flat, nontender without masses or hepatosplenomegaly and peristalsis is normoactive. Extremities have no cyanosis or edema, full range of motion is present. Skin is warm and dry without rash. Neurologic: Mental status is normal, cranial nerves are intact, there are no motor or sensory deficits.  ED Treatments / Results  Labs (all labs ordered are listed, but only abnormal results are displayed) Labs Reviewed  RAPID STREP SCREEN (NOT AT [facility]) - Abnormal; Notable for the following:       Result Value   Streptococcus, Group A Screen (Direct) POSITIVE (*)    All other components within normal limits    Procedures Procedures (including critical care time)  Medications Ordered in ED Medications  ondansetron (ZOFRAN-ODT) disintegrating tablet 8 mg (not administered)  acetaminophen (TYLENOL) tablet 650 mg (not administered)     Initial Impression / Assessment and Plan / ED Course  I have reviewed the triage vital signs and the nursing notes.  Pertinent lab results that were available during my care of the patient were reviewed by me and considered in my medical decision making (see chart for details).  Fever with sore throat. With presence of exudate, will check strep screen. Clinical exam is also suspicious for sinusitis.  Strep screen is positive. Patient was offered option of intramuscular penicillin G versus 10 days of oral prednisone. She is requested  intramuscular penicillin G. This is given. She also given a dose of dexamethasone in the ED. Given a to go pack of oxycodone-acetaminophen for pain control over the next 24 hours. Return precautions discussed. Follow-up with PCP.  Also, please note that patient did have persistent tachycardia at discharge. But she was nontoxic in appearance and tachycardia was felt to be consistent with known source of infection.  Final Clinical Impressions(s) / ED Diagnoses   Final diagnoses:  Streptococcal tonsillitis    New Prescriptions New Prescriptions   OXYCODONE-ACETAMINOPHEN (PERCOCET) 5-325 MG TABLET    Take 1 tablet by mouth every 4 (four) hours as needed for moderate pain.     Delora Fuel, MD 94/76/54 [id number]    Delora Fuel, MD 54/65/68 [phone]

## 2016-07-13 ENCOUNTER — Emergency Department (HOSPITAL_COMMUNITY)
Admission: EM | Admit: 2016-07-13 | Discharge: 2016-07-13 | Disposition: A | Discharge status: Home / Self Care | Payer: Medicaid Other | Attending: Emergency Medicine | Admitting: Emergency Medicine

## 2016-07-13 ENCOUNTER — Encounter (HOSPITAL_COMMUNITY): Payer: Self-pay | Admitting: *Deleted

## 2016-07-13 ENCOUNTER — Emergency Department (HOSPITAL_COMMUNITY): Payer: Medicaid Other

## 2016-07-13 ENCOUNTER — Ambulatory Visit: Payer: Managed Care, Other (non HMO) | Admitting: Internal Medicine

## 2016-07-13 DIAGNOSIS — J029 Acute pharyngitis, unspecified: Secondary | ICD-10-CM | POA: Diagnosis present

## 2016-07-13 DIAGNOSIS — B20 Human immunodeficiency virus [HIV] disease: Secondary | ICD-10-CM

## 2016-07-13 DIAGNOSIS — J45909 Unspecified asthma, uncomplicated: Secondary | ICD-10-CM | POA: Insufficient documentation

## 2016-07-13 DIAGNOSIS — R112 Nausea with vomiting, unspecified: Secondary | ICD-10-CM | POA: Insufficient documentation

## 2016-07-13 DIAGNOSIS — Z79899 Other long term (current) drug therapy: Secondary | ICD-10-CM | POA: Diagnosis not present

## 2016-07-13 DIAGNOSIS — J020 Streptococcal pharyngitis: Secondary | ICD-10-CM | POA: Diagnosis not present

## 2016-07-13 DIAGNOSIS — I10 Essential (primary) hypertension: Secondary | ICD-10-CM | POA: Insufficient documentation

## 2016-07-13 DIAGNOSIS — Z21 Asymptomatic human immunodeficiency virus [HIV] infection status: Secondary | ICD-10-CM | POA: Diagnosis not present

## 2016-07-13 LAB — CBC WITH DIFFERENTIAL/PLATELET
Basophils Absolute: 0.1 10*3/uL (ref 0.0–0.1)
Basophils Relative: 1 %
EOS ABS: 0.1 10*3/uL (ref 0.0–0.7)
Eosinophils Relative: 1 %
HEMATOCRIT: 38.8 % (ref 36.0–46.0)
Hemoglobin: 13.3 g/dL (ref 12.0–15.0)
LYMPHS ABS: 2.6 10*3/uL (ref 0.7–4.0)
Lymphocytes Relative: 34 %
MCH: 30.9 pg (ref 26.0–34.0)
MCHC: 34.3 g/dL (ref 30.0–36.0)
MCV: 90 fL (ref 78.0–100.0)
Monocytes Absolute: 0.6 10*3/uL (ref 0.1–1.0)
Monocytes Relative: 8 %
NEUTROS ABS: 4.3 10*3/uL (ref 1.7–7.7)
Neutrophils Relative %: 56 %
Platelets: 245 10*3/uL (ref 150–400)
RBC: 4.31 MIL/uL (ref 3.87–5.11)
RDW: 14.4 % (ref 11.5–15.5)
WBC: 7.7 10*3/uL (ref 4.0–10.5)

## 2016-07-13 LAB — URINALYSIS, ROUTINE W REFLEX MICROSCOPIC
BILIRUBIN URINE: NEGATIVE
GLUCOSE, UA: NEGATIVE mg/dL
Hgb urine dipstick: NEGATIVE
KETONES UR: NEGATIVE mg/dL
Leukocytes, UA: NEGATIVE
Nitrite: NEGATIVE
PH: 6 (ref 5.0–8.0)
Protein, ur: NEGATIVE mg/dL
Specific Gravity, Urine: 1.021 (ref 1.005–1.030)

## 2016-07-13 LAB — LACTIC ACID, PLASMA: Lactic Acid, Venous: 0.7 mmol/L (ref 0.5–1.9)

## 2016-07-13 LAB — COMPREHENSIVE METABOLIC PANEL
ALBUMIN: 3.9 g/dL (ref 3.5–5.0)
ALK PHOS: 51 U/L (ref 38–126)
ALT: 71 U/L — AB (ref 14–54)
AST: 32 U/L (ref 15–41)
Anion gap: 8 (ref 5–15)
BILIRUBIN TOTAL: 0.7 mg/dL (ref 0.3–1.2)
BUN: 16 mg/dL (ref 6–20)
CALCIUM: 8.9 mg/dL (ref 8.9–10.3)
CO2: 26 mmol/L (ref 22–32)
Chloride: 105 mmol/L (ref 101–111)
Creatinine, Ser: 0.55 mg/dL (ref 0.44–1.00)
GFR calc non Af Amer: >60 mL/min (ref >60)
GLUCOSE: 92 mg/dL (ref 65–99)
Potassium: 3.8 mmol/L (ref 3.5–5.1)
SODIUM: 139 mmol/L (ref 135–145)
TOTAL PROTEIN: 7.7 g/dL (ref 6.5–8.1)

## 2016-07-13 MED ORDER — CLINDAMYCIN HCL 300 MG PO CAPS: 300.0000 mg | ORAL_CAPSULE | Freq: Three times a day (TID) | ORAL | 0 refills | Status: DC

## 2016-07-13 MED ORDER — HYDROCODONE-ACETAMINOPHEN 5-325 MG PO TABS: 1.0000 | ORAL_TABLET | Freq: Four times a day (QID) | ORAL | 0 refills | Status: DC | PRN

## 2016-07-13 MED ORDER — SODIUM CHLORIDE 0.9 % IV BOLUS (SEPSIS)
1000.0000 mL | Freq: Once | INTRAVENOUS | Status: AC
  Administered 2016-07-13: 1000 mL via INTRAVENOUS

## 2016-07-13 MED ORDER — HYDROMORPHONE HCL 1 MG/ML IJ SOLN
1.0000 mg | Freq: Once | INTRAMUSCULAR | Status: AC
  Administered 2016-07-13: 1 mg via INTRAVENOUS
  Filled 2016-07-13: qty 1

## 2016-07-13 MED ORDER — ONDANSETRON 4 MG PO TBDP: 4.0000 mg | ORAL_TABLET | Freq: Once | ORAL | Status: DC

## 2016-07-13 MED ORDER — ONDANSETRON HCL 4 MG/2ML IJ SOLN
4.0000 mg | Freq: Once | INTRAMUSCULAR | Status: AC
  Administered 2016-07-13: 4 mg via INTRAVENOUS
  Filled 2016-07-13: qty 2

## 2016-07-13 MED ORDER — SODIUM CHLORIDE 0.9 % IV SOLN: INTRAVENOUS | Status: DC

## 2016-07-13 NOTE — ED Notes (Signed)
Patient transported to X-ray 

## 2016-07-13 NOTE — ED Notes (Signed)
Pt made aware to return if symptoms worsen or if any life threatening symptoms occur.

## 2016-07-13 NOTE — ED Triage Notes (Signed)
Pt is HIV positive.  She has been back on her antiviral meds since January.  She is unsure what her viral load is at this time.  Pt is here to follow up because she is not feeling any better since being treated for strep on Saturday.  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

## 2016-07-13 NOTE — ED Provider Notes (Addendum)
[facility] DEPT Provider Note   CSN: [id number] Arrival date & time: 07/13/16  1224     History   Chief Complaint Chief Complaint  Patient presents with  . Sore Throat    HPI Chelsey Henson is a 46 y.o. female.  Patient's HIV patient [facility]. Patient seen there April 2 had labs last done April 30. Seen here May 12 was sore throat positive for strep throat. Treated with IM penicillin and pain medicine. Patient states the throat is still sore. Still having got fever and chills afebrile here today. Patient states still difficult to swallow. Does not feel any improvement on the antibiotics. Patient was supposed to be evaluated by infectious disease today. States that she not feel well enough to make the trip to [place]. Patient in the past has not been completely compliant on antivirals. As stated above patient has had some nausea and vomiting no diarrhea.      Past Medical History:  Diagnosis Date  . Anemia   . Anxiety   . Asthma   . Depression   . Genital herpes   . GERD (gastroesophageal reflux disease)   . HIV infection ([place]) 2008  . Hypertension   . Sleep apnea   . 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 OSA (obstructive sleep apnea) 04/15/2016  . Substance abuse 04/15/2016  . Asthma 04/15/2016  . HIV (human immunodeficiency virus infection) ([place]) 04/05/2016  . Essential hypertension 04/05/2016  . GERD without esophagitis 04/05/2016  . Depression, recurrent ([place]) 04/05/2016  . Chronic insomnia 04/05/2016    Past Surgical History:  Procedure Laterality Date  . ABDOMINAL HYSTERECTOMY     BENIGN MASS  . HERNIA REPAIR  2017    OB History    No data available       Home Medications    Prior to Admission medications   Medication Sig Start Date End Date Taking? Authorizing Provider  acyclovir (ZOVIRAX) 800 MG tablet Take 1 tablet (800 mg total) by mouth daily.  05/05/16  Yes Raylene Everts, MD  amLODipine (NORVASC) 10 MG tablet Take 1 tablet (10 mg total) by mouth daily. 05/05/16  Yes Raylene Everts, MD  diphenhydrAMINE (BENADRYL) 25 MG tablet Take 25 mg by mouth every 6 (six) hours as needed (sinus pressure, strep throat).   Yes [provider]  elvitegravir-cobicistat-emtricitabine-tenofovir (GENVOYA) 150-150-200-10 MG TABS tablet Take 1 tablet by mouth daily. 05/31/16  Yes Comer, Okey Regal, MD  escitalopram (LEXAPRO) 10 MG tablet Take 1 tablet (10 mg total) by mouth daily. 05/04/16  Yes Raylene Everts, MD  ibuprofen (ADVIL,MOTRIN) 800 MG tablet Take 1 tablet (800 mg total) by mouth every 8 (eight) hours as needed. 05/31/16  Yes Comer, Okey Regal, MD  oxybutynin (DITROPAN-XL) 10 MG 24 hr tablet Take 1 tablet (10 mg total) by mouth at bedtime. 05/04/16  Yes Raylene Everts, MD  pantoprazole (PROTONIX) 40 MG tablet Take 1 tablet (40 mg total) by mouth daily. 05/04/16  Yes Raylene Everts, MD  traZODone (DESYREL) 100 MG tablet Take 1 tablet (100 mg total) by mouth at bedtime. 05/04/16  Yes Raylene Everts, MD    Family History Family History  Problem Relation Age of Onset  . Depression Mother   . Hyperlipidemia Mother   . Learning disabilities Mother   . Alcohol abuse Mother 40  . Alcohol abuse Father   .  Arthritis Father   . Cancer Father   . Heart disease Father   . Hypertension Father   . Learning disabilities Father   . Alcohol abuse Sister        RECOVERED  . Alcohol abuse Brother   . Alcohol abuse Brother   . Alcohol abuse Brother     Social History Social History  Substance Use Topics  . Smoking status: Never Smoker  . Smokeless tobacco: Never Used  . Alcohol use 0.6 oz/week    1 Cans of beer per week     Comment: occ.     Allergies   Abilify [aripiprazole]   Review of Systems Review of Systems  Constitutional: Positive for fever.  HENT: Positive for ear pain, sore throat and trouble swallowing.   Eyes:  Negative for visual disturbance.  Respiratory: Positive for cough. Negative for shortness of breath.   Cardiovascular: Negative for chest pain.  Gastrointestinal: Positive for nausea and vomiting. Negative for abdominal pain and diarrhea.  Genitourinary: Negative for dysuria.  Musculoskeletal: Negative for back pain and neck pain.  Skin: Negative for rash.  Neurological: Negative for headaches.  Hematological: Does not bruise/bleed easily.  Psychiatric/Behavioral: Negative for confusion.     Physical Exam Updated Vital Signs BP (!) 142/94 (BP Location: Right Arm)   Pulse 71   Temp 98.1 F (36.7 C) (Oral)   Resp 16   LMP 09/30/2014 (Approximate)   SpO2 98%   Physical Exam  Constitutional: She is oriented to person, place, and time. She appears well-developed and well-nourished. No distress.  HENT:  Head: Normocephalic and atraumatic.  Mouth/Throat: Oropharynx is clear and moist. No oropharyngeal exudate.  Posterior pharynx is erythematous UV is midline no exudate no significant swelling  Eyes: Conjunctivae and EOM are normal. Pupils are equal, round, and reactive to light.  Neck: Normal range of motion. Neck supple.  Cardiovascular: Normal rate, regular rhythm and normal heart sounds.   Pulmonary/Chest: Effort normal and breath sounds normal.  Abdominal: Soft. Bowel sounds are normal. There is no tenderness.  Musculoskeletal: Normal range of motion. She exhibits no edema.  Neurological: She is alert and oriented to person, place, and time. No cranial nerve deficit or sensory deficit. She exhibits normal muscle tone. Coordination normal.  Skin: Skin is warm. No rash noted.  Nursing note and vitals reviewed.    ED Treatments / Results  Labs (all labs ordered are listed, but only abnormal results are displayed) Labs Reviewed  CULTURE, BLOOD (ROUTINE X 2)  CULTURE, BLOOD (ROUTINE X 2)  CBC WITH DIFFERENTIAL/PLATELET  COMPREHENSIVE METABOLIC PANEL  URINALYSIS, ROUTINE W  REFLEX MICROSCOPIC  LACTIC ACID, PLASMA    EKG  EKG Interpretation None       Radiology No results found.  Procedures Procedures (including critical care time)  Medications Ordered in ED Medications  0.9 %  sodium chloride infusion (not administered)  ondansetron (ZOFRAN) injection 4 mg (4 mg Intravenous Given 07/13/16 1439)  HYDROmorphone (DILAUDID) injection 1 mg (1 mg Intravenous Given 07/13/16 1439)  sodium chloride 0.9 % bolus 1,000 mL (1,000 mLs Intravenous New Bag/Given 07/13/16 1439)     Initial Impression / Assessment and Plan / ED Course  I have reviewed the triage vital signs and the nursing notes.  Pertinent labs & imaging results that were available during my care of the patient were reviewed by me and considered in my medical decision making (see chart for details).    Patient HIV new patient for [facility].  Patient last seen April 2. Last had labs done by them April 30. Patient seen here in the emergency department on May 12 with positive strep throat. Received IM penicillin and discharged with pain medicines. Patient had a fever at that time 100.5. No fever today. However patient reports intermittent persistent fevers chills nausea vomiting decreased appetite. Patient states throat is still painful.  Patient nontoxic appearing based on vital signs.  Chest x-rays negative lactic acid also normal. Patient stable for discharge home. Will treat with clindamycin for another 7 days and not pain medicine for the sore throat have her follow-up with infectious disease.      Final Clinical Impressions(s) / ED Diagnoses   Final diagnoses:  Strep pharyngitis  HIV (human immunodeficiency virus infection) ([place])    New Prescriptions New Prescriptions   No medications on file    I personally performed the services described in this documentation, which was scribed in my presence. The recorded information has been reviewed and is accurate.        Fredia Sorrow, MD 07/13/16 1447    Fredia Sorrow, MD 07/13/16 [phone]

## 2016-07-13 NOTE — Discharge Instructions (Signed)
So workup here without any acute or concerning findings. Very important to follow-up with infectious disease. Take the antibiotic clindamycin as directed. Blood cultures are pending and will be called if they're abnormal. Also take the pain medicine as needed for the sore throat.

## 2016-07-18 LAB — CULTURE, BLOOD (ROUTINE X 2)
CULTURE: NO GROWTH
Culture: NO GROWTH
SPECIAL REQUESTS: ADEQUATE
Special Requests: ADEQUATE

## 2016-07-19 ENCOUNTER — Ambulatory Visit (INDEPENDENT_AMBULATORY_CARE_PROVIDER_SITE_OTHER): Payer: Medicaid Other | Admitting: Internal Medicine

## 2016-07-19 ENCOUNTER — Encounter: Payer: Self-pay | Admitting: Internal Medicine

## 2016-07-19 VITALS — BP 191/74 | HR 69 | Temp 97.4°F | Wt 189.0 lb

## 2016-07-19 DIAGNOSIS — K0889 Other specified disorders of teeth and supporting structures: Secondary | ICD-10-CM

## 2016-07-19 DIAGNOSIS — Z23 Encounter for immunization: Secondary | ICD-10-CM | POA: Diagnosis not present

## 2016-07-19 DIAGNOSIS — R1030 Lower abdominal pain, unspecified: Secondary | ICD-10-CM

## 2016-07-19 DIAGNOSIS — B20 Human immunodeficiency virus [HIV] disease: Secondary | ICD-10-CM | POA: Diagnosis not present

## 2016-07-19 DIAGNOSIS — R109 Unspecified abdominal pain: Secondary | ICD-10-CM | POA: Insufficient documentation

## 2016-07-19 NOTE — Assessment & Plan Note (Signed)
Doing well on Genvoya.  Suppressed virus.  Can rtc in 4 months.

## 2016-07-19 NOTE — Assessment & Plan Note (Signed)
I recommended acetaminophen intermittenly with Motrin

## 2016-07-19 NOTE — Progress Notes (Signed)
   Subjective:    Patient ID: Chelsey Henson, female    DOB: 1970/08/25, 46 y.o.   MRN: [id number]  HPI Here for follow up of HIV. Her second visit here and had started on Genvoya.  No issues, no missed doses. CD4 670 and viral load down to just 81 copies.  No associated n/v/d. Feels she is having some discomfort at the site of her abdominal mesh that was placed.  Having tooth pain from a dental abscess.  Pain not relieved with motrin.    Review of Systems  Constitutional: Negative for chills and fever.  Gastrointestinal: Negative for diarrhea and nausea.  Skin: Negative for rash.  Neurological: Negative for dizziness.       Objective:   Physical Exam  Constitutional: She appears well-developed and well-nourished. No distress.  Eyes: No scleral icterus.  Cardiovascular: Normal rate, regular rhythm and normal heart sounds.   No murmur heard. Pulmonary/Chest: Effort normal and breath sounds normal. No respiratory distress.  Abdominal: Soft. Bowel sounds are normal. She exhibits no distension. There is no tenderness. There is no rebound.  Surgical incision scar mid line  Lymphadenopathy:    She has no cervical adenopathy.   SH: no tobacco      Assessment & Plan:

## 2016-07-19 NOTE — Assessment & Plan Note (Signed)
I will refer her to surgery in [place] for evaluation.  No concerns on exam.

## 2016-07-23 ENCOUNTER — Ambulatory Visit: Payer: Medicaid Other | Admitting: Family Medicine

## 2016-07-23 ENCOUNTER — Encounter: Payer: Self-pay | Admitting: Family Medicine

## 2016-07-23 VITALS — BP 128/80 | HR 92 | Temp 98.4°F | Resp 24 | Ht 64.0 in | Wt 194.0 lb

## 2016-07-23 DIAGNOSIS — J069 Acute upper respiratory infection, unspecified: Secondary | ICD-10-CM

## 2016-07-23 DIAGNOSIS — F1910 Other psychoactive substance abuse, uncomplicated: Secondary | ICD-10-CM

## 2016-07-23 DIAGNOSIS — F339 Major depressive disorder, recurrent, unspecified: Secondary | ICD-10-CM

## 2016-07-23 MED ORDER — FLUTICASONE PROPIONATE 50 MCG/ACT NA SUSP: 2.0000 | Freq: Every day | NASAL | 6 refills | Status: DC

## 2016-07-23 MED ORDER — BENZONATATE 100 MG PO CAPS: 100.0000 mg | ORAL_CAPSULE | Freq: Three times a day (TID) | ORAL | 0 refills | Status: DC | PRN

## 2016-07-23 NOTE — Progress Notes (Signed)
Chief Complaint  Patient presents with  . Ear Pain  Patient was seen in the emergency room on May 12 and then again on May 15. Diagnosed with strep throat. Treated with a shot of penicillin G and then a course of clindamycin. Lab work and chest x-ray were normal. She tells me ever since then she's been having runny and stuffy nose, postnasal drip, some sinus pressure, some intermittent right ear pain. The postnasal drip is causing a cough. No sputum. No fever or chills. No wheezing or shortness of breath. No chest pain. No headache. She was referred by me to psychiatry when I saw her originally in February. She has never received an appointment. I will redo this consult in order to facilitate referral She also asked me for back x-rays at her original visit. These have not been done. I explained to her that this doesn't need to be scheduled but is performed as a walk-in Hospital. She will get this done this week  Patient Active Problem List   Diagnosis Date Noted  . Pain, dental 07/19/2016  . Abdominal pain 07/19/2016  . OSA (obstructive sleep apnea) 04/15/2016  . Substance abuse 04/15/2016  . Asthma 04/15/2016  . HIV (human immunodeficiency virus infection) ([place]) 04/05/2016  . Essential hypertension 04/05/2016  . GERD without esophagitis 04/05/2016  . Depression, recurrent ([place]) 04/05/2016  . Chronic insomnia 04/05/2016    Outpatient Encounter Prescriptions as of 07/23/2016  Medication Sig  . acyclovir (ZOVIRAX) 800 MG tablet Take 1 tablet (800 mg total) by mouth daily.  Marland Kitchen amLODipine (NORVASC) 10 MG tablet Take 1 tablet (10 mg total) by mouth daily.  . diphenhydrAMINE (BENADRYL) 25 MG tablet Take 25 mg by mouth every 6 (six) hours as needed (sinus pressure, strep throat).  Marland Kitchen elvitegravir-cobicistat-emtricitabine-tenofovir (GENVOYA) 150-150-200-10 MG TABS tablet Take 1 tablet by mouth daily.  Marland Kitchen escitalopram (LEXAPRO) 10 MG tablet Take 1 tablet (10 mg total) by mouth daily.  Marland Kitchen  ibuprofen (ADVIL,MOTRIN) 800 MG tablet Take 1 tablet (800 mg total) by mouth every 8 (eight) hours as needed.  Marland Kitchen oxybutynin (DITROPAN-XL) 10 MG 24 hr tablet Take 1 tablet (10 mg total) by mouth at bedtime.  . pantoprazole (PROTONIX) 40 MG tablet Take 1 tablet (40 mg total) by mouth daily.  . traZODone (DESYREL) 100 MG tablet Take 1 tablet (100 mg total) by mouth at bedtime.  . [DISCONTINUED] clindamycin (CLEOCIN) 300 MG capsule Take 1 capsule (300 mg total) by mouth 3 (three) times daily.  . [DISCONTINUED] HYDROcodone-acetaminophen (NORCO/VICODIN) 5-325 MG tablet Take 1-2 tablets by mouth every 6 (six) hours as needed.  . benzonatate (TESSALON) 100 MG capsule Take 1 capsule (100 mg total) by mouth 3 (three) times daily as needed for cough.  . fluticasone (FLONASE) 50 MCG/ACT nasal spray Place 2 sprays into both nostrils daily.   No facility-administered encounter medications on file as of 07/23/2016.     Allergies  Allergen Reactions  . Abilify [Aripiprazole] Other (See Comments)    Tardive dyskinesia    Review of Systems  Constitutional: Negative for activity change, appetite change and unexpected weight change.  HENT: Positive for congestion, ear pain, sinus pressure and sore throat. Negative for dental problem, postnasal drip and rhinorrhea.   Eyes: Negative for redness and visual disturbance.  Respiratory: Positive for cough. Negative for shortness of breath.   Cardiovascular: Negative for chest pain, palpitations and leg swelling.  Gastrointestinal: Negative for abdominal pain, constipation and diarrhea.  Genitourinary: Negative for difficulty urinating and  frequency.  Musculoskeletal: Positive for back pain. Negative for arthralgias.  Neurological: Negative for dizziness and headaches.  Psychiatric/Behavioral: Negative for dysphoric mood and sleep disturbance. The patient is not nervous/anxious.     BP 128/80 (BP Location: Left Arm, Patient Position: Sitting, Cuff Size: Normal)    Pulse 92   Temp 98.4 F (36.9 C) (Temporal)   Resp (!) 24   Ht 5' 4"  (1.626 m)   Wt 194 lb 0.6 oz (88 kg)   LMP 09/30/2014 (Approximate)   SpO2 97%   BMI 33.31 kg/m   Physical Exam  Constitutional: She appears well-developed and well-nourished. No distress.  HENT:  Head: Normocephalic and atraumatic.  Right Ear: External ear normal.  Left Ear: External ear normal.  Nose: Nose normal.  Mouth/Throat: Oropharynx is clear and moist. No oropharyngeal exudate.  Right tympanic membrane with scarring, normal light reflex  Eyes: EOM are normal. Pupils are equal, round, and reactive to light.  Neck: Normal range of motion. Neck supple.  Mildly tender ac nodes  Cardiovascular: Normal rate, regular rhythm and normal heart sounds.   Pulmonary/Chest: Effort normal and breath sounds normal. No respiratory distress. She has no wheezes.  Abdominal: Soft. Bowel sounds are normal. There is no tenderness.  Musculoskeletal: Normal range of motion. She exhibits no edema.  Lymphadenopathy:    She has cervical adenopathy.  Psychiatric: She has a normal mood and affect. Her behavior is normal.    ASSESSMENT/PLAN:  1. URI with cough and congestion Discussed that this may be viral, but is more likely due to allergies because of her persistent symptoms. She can take over-the-counter Claritin or Zyrtec. She should use Flonase. Push fluids.  2. Depression, recurrent ([place])  - Ambulatory referral to Psychiatry  3. Substance abuse  - Ambulatory referral to Psychiatry   Patient Instructions  Push fluids Take the tessalon as needed cough Use the flonase for the nasal congestion and drainage  Call if not better in a few days   Raylene Everts, MD

## 2016-07-23 NOTE — Patient Instructions (Signed)
Push fluids Take the tessalon as needed cough Use the flonase for the nasal congestion and drainage  Call if not better in a few days

## 2016-07-27 ENCOUNTER — Ambulatory Visit (HOSPITAL_COMMUNITY)
Admission: RE | Admit: 2016-07-27 | Discharge: 2016-07-27 | Disposition: A | Discharge status: Home / Self Care | Payer: Medicaid Other | Source: Ambulatory Visit | Attending: Family Medicine | Admitting: Family Medicine

## 2016-07-27 DIAGNOSIS — M5431 Sciatica, right side: Secondary | ICD-10-CM | POA: Insufficient documentation

## 2016-07-29 ENCOUNTER — Encounter: Payer: Self-pay | Admitting: Family Medicine

## 2016-07-31 ENCOUNTER — Other Ambulatory Visit: Payer: Self-pay | Admitting: Family Medicine

## 2016-08-05 ENCOUNTER — Ambulatory Visit (INDEPENDENT_AMBULATORY_CARE_PROVIDER_SITE_OTHER): Payer: Medicaid Other | Admitting: General Surgery

## 2016-08-05 ENCOUNTER — Encounter: Payer: Self-pay | Admitting: General Surgery

## 2016-08-05 VITALS — BP 134/88 | HR 71 | Temp 97.7°F | Resp 18 | Ht 64.0 in | Wt 189.0 lb

## 2016-08-05 DIAGNOSIS — R1031 Right lower quadrant pain: Secondary | ICD-10-CM | POA: Diagnosis not present

## 2016-08-05 DIAGNOSIS — G8929 Other chronic pain: Secondary | ICD-10-CM

## 2016-08-05 NOTE — Progress Notes (Signed)
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 Linus Salmons for evaluation treatment of right lower quadrant abdominal pain. Is been present for some time now. It is made worse with movement. Her pain is 6 out of 10. She does not notice a mass. She does have point tenderness in the right lower quadrant region, just lateral to the incision line. She states the pain started occurring one month after her hernia repair. She denies any nausea or vomiting. She denies any nausea, diarrhea, constipation. Past Medical History:  Diagnosis Date  . Anemia   . Anxiety   . Asthma   . Depression   . Genital herpes   . GERD (gastroesophageal reflux disease)   . HIV infection ([place]) 2008  . Hypertension   . Sleep apnea   . 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 ABDOMINAL HYSTERECTOMY     BENIGN MASS  . HERNIA REPAIR  2017    Family History  Problem Relation Age of Onset  . Depression Mother   . Hyperlipidemia Mother   . Learning disabilities Mother   . Alcohol abuse Mother 22  . Alcohol abuse Father   . Arthritis Father   . Cancer Father   . Heart disease Father   . Hypertension Father   . Learning disabilities Father   . Alcohol abuse Sister        RECOVERED  . Alcohol abuse Brother   . Alcohol abuse Brother   . Alcohol abuse Brother     Current Outpatient Prescriptions on File Prior to Visit  Medication Sig Dispense Refill  . acyclovir (ZOVIRAX) 800 MG tablet Take 1 tablet (800 mg total) by mouth daily. 90 tablet 1  . amLODipine (NORVASC) 10 MG tablet Take 1 tablet (10 mg total) by mouth daily. 90 tablet 3  . benzonatate (TESSALON) 100 MG capsule Take 1 capsule (100 mg total) by mouth 3 (three) times daily as needed for cough. 20 capsule 0  .  diphenhydrAMINE (BENADRYL) 25 MG tablet Take 25 mg by mouth every 6 (six) hours as needed (sinus pressure, strep throat).    Marland Kitchen elvitegravir-cobicistat-emtricitabine-tenofovir (GENVOYA) 150-150-200-10 MG TABS tablet Take 1 tablet by mouth daily. 30 tablet 5  . escitalopram (LEXAPRO) 10 MG tablet Take 1 tablet (10 mg total) by mouth daily. 90 tablet 1  . fluticasone (FLONASE) 50 MCG/ACT nasal spray Place 2 sprays into both nostrils daily. 16 g 6  . ibuprofen (ADVIL,MOTRIN) 800 MG tablet Take 1 tablet (800 mg total) by mouth every 8 (eight) hours as needed. 30 tablet 2  . oxybutynin (DITROPAN-XL) 10 MG 24 hr tablet Take 1 tablet (10 mg total) by mouth at bedtime. 90 tablet 1  . pantoprazole (PROTONIX) 40 MG tablet TAKE ONE TABLET BY MOUTH ONCE DAILY. 90 tablet 1  . traZODone (DESYREL) 100 MG tablet Take 1 tablet (100 mg total) by mouth at bedtime. 90 tablet 1   No current facility-administered medications on file prior to visit.     Allergies  Allergen Reactions  . Abilify [Aripiprazole] Other (See Comments)    Tardive dyskinesia    History  Alcohol Use  . 0.6 oz/week  . 1 Cans of beer per week    Comment: occ.    History  Smoking Status  . Never Smoker  Smokeless Tobacco  . Never Used    Review of Systems  Constitutional: Positive for malaise/fatigue.  HENT: Negative.   Eyes: Positive for pain.  Respiratory: Positive for cough and shortness of breath.   Cardiovascular: Negative.   Gastrointestinal: Positive for abdominal pain and heartburn.  Genitourinary: Negative.   Musculoskeletal: Negative.   Skin: Negative.   Neurological: Positive for headaches.  Endo/Heme/Allergies: Negative.   Psychiatric/Behavioral: Negative.     Objective   Vitals:   08/05/16 1049  BP: 134/88  Pulse: 71  Resp: 18  Temp: 97.7 F (36.5 C)    Physical Exam  Constitutional: She is well-developed, well-nourished, and in no distress.  HENT:  Head: Normocephalic and atraumatic.   Cardiovascular: Normal rate, regular rhythm and normal heart sounds.   No murmur heard. Pulmonary/Chest: Effort normal and breath sounds normal. She has no wheezes. She has no rales.  Abdominal: Soft. Bowel sounds are normal. She exhibits no distension and no mass. There is tenderness. There is no rebound.  Point tenderness noted to palpation in the right lower quadrant just lateral to a midline surgical scar. I could not appreciate a hernia.  Vitals reviewed.   Assessment  Musculoskeletal pain, question incisional hernia Plan   We'll get CT scan of abdomen and pelvis to evaluate the abdominal wall. Is to follow-up with me to go over the results.

## 2016-08-06 ENCOUNTER — Other Ambulatory Visit: Payer: Self-pay | Admitting: Internal Medicine

## 2016-08-19 ENCOUNTER — Telehealth (HOSPITAL_COMMUNITY): Payer: Self-pay | Admitting: *Deleted

## 2016-08-19 NOTE — Telephone Encounter (Signed)
left voice message regarding an appointment.

## 2016-08-20 ENCOUNTER — Telehealth (HOSPITAL_COMMUNITY): Payer: Self-pay | Admitting: *Deleted

## 2016-08-20 NOTE — Telephone Encounter (Signed)
Left voice message regarding an appointment.

## 2016-08-31 ENCOUNTER — Other Ambulatory Visit: Payer: Self-pay | Admitting: Internal Medicine
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
## 2016-09-08 ENCOUNTER — Ambulatory Visit (INDEPENDENT_AMBULATORY_CARE_PROVIDER_SITE_OTHER): Payer: Medicaid Other | Admitting: Psychiatry

## 2016-09-08 ENCOUNTER — Encounter (HOSPITAL_COMMUNITY): Payer: Self-pay | Admitting: Psychiatry

## 2016-09-08 VITALS — BP 118/82 | HR 90 | Resp 17 | Ht 64.0 in | Wt 195.6 lb

## 2016-09-08 DIAGNOSIS — F333 Major depressive disorder, recurrent, severe with psychotic symptoms: Secondary | ICD-10-CM | POA: Insufficient documentation

## 2016-09-08 DIAGNOSIS — B20 Human immunodeficiency virus [HIV] disease: Secondary | ICD-10-CM

## 2016-09-08 DIAGNOSIS — G4733 Obstructive sleep apnea (adult) (pediatric): Secondary | ICD-10-CM | POA: Diagnosis not present

## 2016-09-08 DIAGNOSIS — Z811 Family history of alcohol abuse and dependence: Secondary | ICD-10-CM

## 2016-09-08 DIAGNOSIS — Z90710 Acquired absence of both cervix and uterus: Secondary | ICD-10-CM

## 2016-09-08 DIAGNOSIS — F1421 Cocaine dependence, in remission: Secondary | ICD-10-CM | POA: Diagnosis not present

## 2016-09-08 DIAGNOSIS — F419 Anxiety disorder, unspecified: Secondary | ICD-10-CM

## 2016-09-08 DIAGNOSIS — Z818 Family history of other mental and behavioral disorders: Secondary | ICD-10-CM | POA: Diagnosis not present

## 2016-09-08 DIAGNOSIS — F331 Major depressive disorder, recurrent, moderate: Secondary | ICD-10-CM | POA: Diagnosis not present

## 2016-09-08 MED ORDER — ESCITALOPRAM OXALATE 20 MG PO TABS: 20.0000 mg | ORAL_TABLET | Freq: Every day | ORAL | 1 refills | Status: DC

## 2016-09-08 NOTE — Patient Instructions (Addendum)
1. Increase lexapro 20 mg daily 2. Continue Trazodone 100 mg at night 3. Return to clinic in one month for 30 mins

## 2016-09-17 ENCOUNTER — Telehealth: Payer: Self-pay | Admitting: Family Medicine

## 2016-09-17 NOTE — Telephone Encounter (Signed)
Patient calling to talk with nurse. She left message yesterday 3:09 on voice mail.  She believes she has a reoccurring UTI.  The last was July 4th. Thought it cleared up. Complaining of pain when urinating and vaginal dryness. Last Rx taken was give to her from ER in [place].  Patient also went to Urgent Care about 5 weeks ago.  Please advise

## 2016-09-20 ENCOUNTER — Ambulatory Visit (INDEPENDENT_AMBULATORY_CARE_PROVIDER_SITE_OTHER): Payer: Medicaid Other

## 2016-09-20 DIAGNOSIS — R309 Painful micturition, unspecified: Secondary | ICD-10-CM

## 2016-09-20 LAB — POCT URINALYSIS DIPSTICK
Bilirubin, UA: NEGATIVE
Blood, UA: NEGATIVE
Glucose, UA: NEGATIVE
Ketones, UA: NEGATIVE
Leukocytes, UA: NEGATIVE
Nitrite, UA: NEGATIVE
PH UA: 6 (ref 5.0–8.0)
PROTEIN UA: NEGATIVE
SPEC GRAV UA: 1.01 (ref 1.010–1.025)
UROBILINOGEN UA: 0.2 U/dL

## 2016-09-20 NOTE — Telephone Encounter (Signed)
Called Chelsey Henson, she is going to come in to office today and leave [place] a urine spec.

## 2016-10-04 ENCOUNTER — Other Ambulatory Visit: Payer: Self-pay | Admitting: Family Medicine
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
## 2016-10-08 ENCOUNTER — Ambulatory Visit (HOSPITAL_COMMUNITY): Payer: Medicaid Other | Admitting: Psychiatry

## 2016-10-12 ENCOUNTER — Ambulatory Visit: Payer: Medicaid Other | Admitting: General Surgery

## 2016-10-30 ENCOUNTER — Other Ambulatory Visit: Payer: Self-pay | Admitting: Internal Medicine

## 2016-11-01 ENCOUNTER — Other Ambulatory Visit: Payer: Self-pay | Admitting: Family Medicine

## 2016-11-02 NOTE — Telephone Encounter (Signed)
Seen [phone]

## 2016-11-16 ENCOUNTER — Encounter: Payer: Self-pay | Admitting: Family Medicine

## 2016-11-16 ENCOUNTER — Ambulatory Visit (INDEPENDENT_AMBULATORY_CARE_PROVIDER_SITE_OTHER): Payer: Medicaid Other | Admitting: Family Medicine

## 2016-11-16 ENCOUNTER — Encounter (INDEPENDENT_AMBULATORY_CARE_PROVIDER_SITE_OTHER): Payer: Self-pay

## 2016-11-16 VITALS — BP 116/72 | HR 84 | Temp 99.0°F | Resp 20 | Ht 64.0 in | Wt 199.1 lb

## 2016-11-16 DIAGNOSIS — I10 Essential (primary) hypertension: Secondary | ICD-10-CM | POA: Diagnosis not present

## 2016-11-16 DIAGNOSIS — R600 Localized edema: Secondary | ICD-10-CM | POA: Diagnosis not present

## 2016-11-16 DIAGNOSIS — Z23 Encounter for immunization: Secondary | ICD-10-CM | POA: Diagnosis not present

## 2016-11-16 MED ORDER — LISINOPRIL-HYDROCHLOROTHIAZIDE 10-12.5 MG PO TABS: 1.0000 | ORAL_TABLET | Freq: Every day | ORAL | 3 refills | Status: DC

## 2016-11-16 NOTE — Patient Instructions (Signed)
Stop the amlodipine Take the lisinopril once a  Day Let me know if there are any problems  See me in 3-4 weeks

## 2016-11-16 NOTE — Progress Notes (Signed)
Chief Complaint  Patient presents with  . Foot Swelling    x 3 days   Here for edema Worse the last week No change in diet or added slat No change in health in general or medications Ho swelling abdomen or hands No shortness of breath  Patient Active Problem List   Diagnosis Date Noted  . MDD (major depressive disorder), recurrent episode, moderate ([place]) 09/08/2016  . Cocaine use disorder, moderate, in sustained remission ([place]) 09/08/2016  . Pain, dental 07/19/2016  . Abdominal pain 07/19/2016  . OSA (obstructive sleep apnea) 04/15/2016  . Asthma 04/15/2016  . HIV (human immunodeficiency virus infection) ([place]) 04/05/2016  . Essential hypertension 04/05/2016  . GERD without esophagitis 04/05/2016  . Chronic insomnia 04/05/2016    Outpatient Encounter Prescriptions as of 11/16/2016  Medication Sig  . acyclovir (ZOVIRAX) 800 MG tablet TAKE 1 TABLET EVERY DAY.  . benzonatate (TESSALON) 100 MG capsule Take 1 capsule (100 mg total) by mouth 3 (three) times daily as needed for cough.  . diphenhydrAMINE (BENADRYL) 25 MG tablet Take 25 mg by mouth every 6 (six) hours as needed (sinus pressure, strep throat).  Marland Kitchen escitalopram (LEXAPRO) 20 MG tablet Take 1 tablet (20 mg total) by mouth daily.  . fluticasone (FLONASE) 50 MCG/ACT nasal spray Place 2 sprays into both nostrils daily.  . GENVOYA 150-150-200-10 MG TABS tablet TAKE ONE TABLET BY MOUTH ONCE DAILY.  . IBU 800 MG tablet TAKE (1) TABLET BY MOUTH EVERY EIGHT HOURS AS NEEDED.  Marland Kitchen oxybutynin (DITROPAN-XL) 10 MG 24 hr tablet Take 1 tablet (10 mg total) by mouth at bedtime.  . pantoprazole (PROTONIX) 40 MG tablet TAKE ONE TABLET BY MOUTH ONCE DAILY.  . traZODone (DESYREL) 100 MG tablet TAKE (1) TABLET BY MOUTH AT BEDTIME.  . [DISCONTINUED] amLODipine (NORVASC) 10 MG tablet Take 1 tablet (10 mg total) by mouth daily.  Marland Kitchen lisinopril-hydrochlorothiazide (PRINZIDE,ZESTORETIC) 10-12.5 MG tablet Take 1 tablet by mouth daily.   No  facility-administered encounter medications on file as of 11/16/2016.     Allergies  Allergen Reactions  . Abilify [Aripiprazole] Other (See Comments)    Tardive dyskinesia  . Codeine Itching    Review of Systems  Constitutional: Negative for fatigue and unexpected weight change.  HENT: Negative for congestion and dental problem.   Eyes: Negative for photophobia and visual disturbance.  Respiratory: Negative for cough and shortness of breath.   Cardiovascular: Positive for leg swelling. Negative for chest pain and palpitations.  Gastrointestinal: Negative for abdominal distention, constipation and diarrhea.  Genitourinary: Negative for difficulty urinating and frequency.  All other systems reviewed and are negative.    BP 116/72 (BP Location: Right Arm, Patient Position: Sitting, Cuff Size: Normal)   Pulse 84   Temp 99 F (37.2 C) (Oral)   Resp 20   Ht 5' 4"  (1.626 m)   Wt 199 lb 1.3 oz (90.3 kg)   LMP 09/30/2014 (Approximate)   SpO2 98%   BMI 34.17 kg/m   Physical Exam  Constitutional: She appears well-developed and well-nourished.  overweight  HENT:  Head: Normocephalic and atraumatic.  Right Ear: External ear normal.  Left Ear: External ear normal.  Mouth/Throat: Oropharynx is clear and moist.  Eyes: Pupils are equal, round, and reactive to light. Conjunctivae are normal.  Neck: Normal range of motion. No JVD present. No thyromegaly present.  Cardiovascular: Normal rate, regular rhythm and normal heart sounds.   Pulmonary/Chest: Effort normal and breath sounds normal. She has no rales.  Abdominal: Soft. Bowel sounds are normal. She exhibits no distension.  Musculoskeletal: Normal range of motion.  1+ edema both ankles   Good pedal pulses.  Lymphadenopathy:    She has no cervical adenopathy.  Psychiatric: She has a normal mood and affect. Her behavior is normal. Thought content normal.    ASSESSMENT/PLAN:  1. Pedal edema Possibly due to amlodipine  2.  Essential hypertension controlled  3. Need for influenza vaccination  - Flu Vaccine QUAD 36+ mos IM   Patient Instructions  Stop the amlodipine Take the lisinopril once a  Day Let me know if there are any problems  See me in 3-4 weeks    Raylene Everts, MD

## 2016-11-23 ENCOUNTER — Ambulatory Visit (INDEPENDENT_AMBULATORY_CARE_PROVIDER_SITE_OTHER): Payer: Medicaid Other | Admitting: Internal Medicine

## 2016-11-23 ENCOUNTER — Encounter: Payer: Self-pay | Admitting: Internal Medicine

## 2016-11-23 VITALS — BP 113/80 | HR 74 | Temp 98.0°F | Ht 64.0 in | Wt 197.0 lb

## 2016-11-23 DIAGNOSIS — B20 Human immunodeficiency virus [HIV] disease: Secondary | ICD-10-CM

## 2016-11-23 DIAGNOSIS — Z113 Encounter for screening for infections with a predominantly sexual mode of transmission: Secondary | ICD-10-CM

## 2016-11-23 DIAGNOSIS — R1030 Lower abdominal pain, unspecified: Secondary | ICD-10-CM | POA: Diagnosis not present

## 2016-11-23 NOTE — Assessment & Plan Note (Signed)
Doing well.  Labs today and rtc 6 months unless concerns.

## 2016-11-23 NOTE — Progress Notes (Signed)
   Subjective:    Patient ID: Chelsey Henson, female    DOB: 1970-09-04, 46 y.o.   MRN: [id number]  HPI Here for follow up of HIV. Her second visit here and had started on Genvoya.  No issues, no missed doses.  No labs prior to this visit.  No associated n/v/d. Still some discomfort in her abdomien and saw Dr. Arnoldo Morale but she tells me the CT was denied and she did not follow up with him again.    Review of Systems  Constitutional: Negative for chills and fever.  Gastrointestinal: Negative for diarrhea and nausea.  Skin: Negative for rash.       Objective:   Physical Exam  Constitutional: She appears well-developed and well-nourished. No distress.  Eyes: No scleral icterus.  Cardiovascular: Normal rate, regular rhythm and normal heart sounds.   No murmur heard. Pulmonary/Chest: Effort normal and breath sounds normal. No respiratory distress.  Lymphadenopathy:    She has no cervical adenopathy.        Assessment & Plan:

## 2016-11-23 NOTE — Assessment & Plan Note (Signed)
Stable discomfort.  No concerns on history.

## 2016-11-24 LAB — T-HELPER CELL (CD4) - (RCID CLINIC ONLY)
CD4 T CELL ABS: 740 /uL (ref 400–2700)
CD4 T CELL HELPER: 31 % — AB (ref 33–55)

## 2016-11-25 LAB — HIV-1 RNA QUANT-NO REFLEX-BLD
HIV 1 RNA QUANT: NOT DETECTED {copies}/mL
HIV-1 RNA QUANT, LOG: NOT DETECTED {Log_copies}/mL

## 2016-11-27 ENCOUNTER — Other Ambulatory Visit: Payer: Self-pay | Admitting: Family Medicine

## 2016-12-16 ENCOUNTER — Encounter: Payer: Self-pay | Admitting: Family Medicine

## 2016-12-16 ENCOUNTER — Ambulatory Visit (INDEPENDENT_AMBULATORY_CARE_PROVIDER_SITE_OTHER): Payer: Medicaid Other | Admitting: Family Medicine

## 2016-12-16 VITALS — BP 110/74 | HR 88 | Temp 97.8°F | Resp 18 | Ht 64.0 in | Wt 199.1 lb

## 2016-12-16 DIAGNOSIS — I10 Essential (primary) hypertension: Secondary | ICD-10-CM

## 2016-12-16 MED ORDER — MONTELUKAST SODIUM 10 MG PO TABS: 10.0000 mg | ORAL_TABLET | Freq: Every day | ORAL | 3 refills | Status: DC

## 2016-12-16 MED ORDER — ALBUTEROL SULFATE HFA 108 (90 BASE) MCG/ACT IN AERS: 2.0000 | INHALATION_SPRAY | Freq: Four times a day (QID) | RESPIRATORY_TRACT | 2 refills | Status: DC | PRN

## 2016-12-16 NOTE — Patient Instructions (Addendum)
BP is good No change  In medicines  See me in 6 months Call sooner for problems

## 2016-12-16 NOTE — Progress Notes (Signed)
Chief Complaint  Patient presents with  . Follow-up    1 month   she was seen for pedal edema and I stopped her amlodipine Is tolerating her newBP med well edema is gone Wants to lose weight  Discussed diet and exercise Claims she cannot exercise due to asthma We discussed with management of asthma  She needs to be able to gradually improve her physical condition Needs to reduce carbs and sweets and focus on low fat protein, vegetables and fruits  Patient Active Problem List   Diagnosis Date Noted  . Screening examination for venereal disease 11/23/2016  . MDD (major depressive disorder), recurrent episode, moderate ([place]) 09/08/2016  . Cocaine use disorder, moderate, in sustained remission ([place]) 09/08/2016  . Pain, dental 07/19/2016  . Abdominal pain 07/19/2016  . OSA (obstructive sleep apnea) 04/15/2016  . Asthma 04/15/2016  . HIV (human immunodeficiency virus infection) ([place]) 04/05/2016  . Essential hypertension 04/05/2016  . GERD without esophagitis 04/05/2016  . Chronic insomnia 04/05/2016    Outpatient Encounter Prescriptions as of 12/16/2016  Medication Sig  . acyclovir (ZOVIRAX) 800 MG tablet TAKE 1 TABLET EVERY DAY.  Marland Kitchen escitalopram (LEXAPRO) 20 MG tablet Take 1 tablet (20 mg total) by mouth daily.  . GENVOYA 150-150-200-10 MG TABS tablet TAKE ONE TABLET BY MOUTH ONCE DAILY.  . IBU 800 MG tablet TAKE (1) TABLET BY MOUTH EVERY EIGHT HOURS AS NEEDED.  Marland Kitchen lisinopril-hydrochlorothiazide (PRINZIDE,ZESTORETIC) 10-12.5 MG tablet Take 1 tablet by mouth daily.  Marland Kitchen oxybutynin (DITROPAN-XL) 10 MG 24 hr tablet Take 1 tablet (10 mg total) by mouth at bedtime.  . pantoprazole (PROTONIX) 40 MG tablet TAKE ONE TABLET BY MOUTH ONCE DAILY.  . traZODone (DESYREL) 100 MG tablet TAKE (1) TABLET BY MOUTH AT BEDTIME.  Marland Kitchen albuterol (PROVENTIL HFA;VENTOLIN HFA) 108 (90 Base) MCG/ACT inhaler Inhale 2 puffs into the lungs every 6 (six) hours as needed for wheezing or shortness of breath.  .  montelukast (SINGULAIR) 10 MG tablet Take 1 tablet (10 mg total) by mouth at bedtime.   No facility-administered encounter medications on file as of 12/16/2016.     Allergies  Allergen Reactions  . Abilify [Aripiprazole] Other (See Comments)    Tardive dyskinesia  . Codeine Itching    Review of Systems  Constitutional: Negative for fatigue and unexpected weight change.  HENT: Negative for congestion and dental problem.   Eyes: Negative for photophobia and visual disturbance.  Respiratory: Positive for shortness of breath. Negative for cough.   Cardiovascular: Negative for chest pain, palpitations and leg swelling.  Gastrointestinal: Negative for abdominal distention, constipation and diarrhea.  Genitourinary: Negative for difficulty urinating and frequency.  All other systems reviewed and are negative.   BP 110/74 (BP Location: Right Arm, Patient Position: Sitting, Cuff Size: Normal)   Pulse 88   Temp 97.8 F (36.6 C) (Temporal)   Resp 18   Ht 5' 4"  (1.626 m)   Wt 199 lb 1.3 oz (90.3 kg)   LMP 09/30/2014 (Approximate)   SpO2 97%   BMI 34.17 kg/m   Physical Exam  Constitutional: She appears well-developed and well-nourished.  overweight  HENT:  Head: Normocephalic and atraumatic.  Right Ear: External ear normal.  Left Ear: External ear normal.  Mouth/Throat: Oropharynx is clear and moist.  Eyes: Pupils are equal, round, and reactive to light. Conjunctivae are normal.  Neck: Normal range of motion. No JVD present. No thyromegaly present.  Cardiovascular: Normal rate, regular rhythm and normal heart sounds.   Pulmonary/Chest:  Effort normal and breath sounds normal. She has no rales.  Abdominal: Soft. Bowel sounds are normal. She exhibits no distension.  Musculoskeletal: Normal range of motion. She exhibits no edema.   Good pedal pulses.  Psychiatric: She has a normal mood and affect. Her behavior is normal. Thought content normal.    ASSESSMENT/PLAN:  1. Essential  hypertension controlled   Patient Instructions  BP is good No change  In medicines  See me in 6 months Call sooner for problems   Raylene Everts, MD

## 2016-12-23 ENCOUNTER — Telehealth: Payer: Self-pay | Admitting: Family Medicine

## 2016-12-23 DIAGNOSIS — K219 Gastro-esophageal reflux disease without esophagitis: Secondary | ICD-10-CM

## 2016-12-23 NOTE — Telephone Encounter (Signed)
Patient would like to get a referral for Dr Laural Golden for GERD.  She is taking the Rx for the cough and antacid, but it's not working.  Any suggestions?    cb  [phone]

## 2016-12-23 NOTE — Telephone Encounter (Signed)
May refer GI.  She will be seen sooner if she agrees to go to [facility]

## 2016-12-27 ENCOUNTER — Other Ambulatory Visit: Payer: Self-pay | Admitting: Family Medicine

## 2016-12-28 NOTE — Telephone Encounter (Signed)
Seen 10 18 18

## 2016-12-31 ENCOUNTER — Encounter (INDEPENDENT_AMBULATORY_CARE_PROVIDER_SITE_OTHER): Payer: Self-pay | Admitting: Internal Medicine

## 2016-12-31 ENCOUNTER — Ambulatory Visit (INDEPENDENT_AMBULATORY_CARE_PROVIDER_SITE_OTHER): Payer: Medicaid Other | Admitting: Internal Medicine

## 2016-12-31 VITALS — BP 122/70 | HR 76 | Temp 98.5°F | Ht 64.0 in | Wt 201.8 lb

## 2016-12-31 DIAGNOSIS — K219 Gastro-esophageal reflux disease without esophagitis: Secondary | ICD-10-CM

## 2016-12-31 MED ORDER — DEXLANSOPRAZOLE 60 MG PO CPDR: 60.0000 mg | DELAYED_RELEASE_CAPSULE | Freq: Every day | ORAL | 3 refills | Status: DC

## 2016-12-31 NOTE — Patient Instructions (Addendum)
GERD diet. Rx for Dexilant sent to her pharmacy. Samples x 5 boxes given to patient Raise head of bed.  Gastroesophageal Reflux Disease, Adult Normally, food travels down the esophagus and stays in the stomach to be digested. If a person has gastroesophageal reflux disease (GERD), food and stomach acid move back up into the esophagus. When this happens, the esophagus becomes sore and swollen (inflamed). Over time, GERD can make small holes (ulcers) in the lining of the esophagus. Follow these instructions at home: Diet  Follow a diet as told by your doctor. You may need to avoid foods and drinks such as: ? Coffee and tea (with or without caffeine). ? Drinks that contain alcohol. ? Energy drinks and sports drinks. ? Carbonated drinks or sodas. ? Chocolate and cocoa. ? Peppermint and mint flavorings. ? Garlic and onions. ? Horseradish. ? Spicy and acidic foods, such as peppers, chili powder, curry powder, vinegar, hot sauces, and BBQ sauce. ? Citrus fruit juices and citrus fruits, such as oranges, lemons, and limes. ? Tomato-based foods, such as red sauce, chili, salsa, and pizza with red sauce. ? Fried and fatty foods, such as donuts, french fries, potato chips, and high-fat dressings. ? High-fat meats, such as hot dogs, rib eye steak, sausage, ham, and bacon. ? High-fat dairy items, such as whole milk, butter, and cream cheese.  Eat small meals often. Avoid eating large meals.  Avoid drinking large amounts of liquid with your meals.  Avoid eating meals during the 2-3 hours before bedtime.  Avoid lying down right after you eat.  Do not exercise right after you eat. General instructions  Pay attention to any changes in your symptoms.  Take over-the-counter and prescription medicines only as told by your doctor. Do not take aspirin, ibuprofen, or other NSAIDs unless your doctor says it is okay.  Do not use any tobacco products, including cigarettes, chewing tobacco, and  e-cigarettes. If you need help quitting, ask your doctor.  Wear loose clothes. Do not wear anything tight around your waist.  Raise (elevate) the head of your bed about 6 inches (15 cm).  Try to lower your stress. If you need help doing this, ask your doctor.  If you are overweight, lose an amount of weight that is healthy for you. Ask your doctor about a safe weight loss goal.  Keep all follow-up visits as told by your doctor. This is important. Contact a doctor if:  You have new symptoms.  You lose weight and you do not know why it is happening.  You have trouble swallowing, or it hurts to swallow.  You have wheezing or a cough that keeps happening.  Your symptoms do not get better with treatment.  You have a hoarse voice. Get help right away if:  You have pain in your arms, neck, jaw, teeth, or back.  You feel sweaty, dizzy, or light-headed.  You have chest pain or shortness of breath.  You throw up (vomit) and your throw up looks like blood or coffee grounds.  You pass out (faint).  Your poop (stool) is bloody or black.  You cannot swallow, drink, or eat. This information is not intended to replace advice given to you by your health care provider. Make sure you discuss any questions you have with your health care provider. Document Released: 08/04/2007 Document Revised: 07/24/2015 Document Reviewed: 06/12/2014 Elsevier Interactive Patient Education  [employer].

## 2016-12-31 NOTE — Progress Notes (Signed)
Subjective:    Patient ID: Chelsey Henson, female    DOB: January 26, 1971, 46 y.o.   MRN: [id number]  HPI  Referred by Dr. Meda Coffee for GERD. Presently taking Protonix 49m daily.  She tells me she has heartburn. She says she has acid reflux at night. She coughs at night. She uses Zantac 2-3 times a day.  Sometimes she does not use .  She says  The Protonix does not help at all. She has tried Nexium which does not help.  She is avoiding spicy and greasy foods.  Appetite is good. No weight loss. Has a BM every other day.   Hx significant for HIV, cocaine abuse which she has not used x 8 yrs.  Review of Systems Past Medical History:  Diagnosis Date  . Anemia   . Anxiety   . Asthma   . Depression   . Genital herpes   . GERD (gastroesophageal reflux disease)   . HIV infection ([place] 2008  . Hypertension   . Sleep apnea   . Substance abuse ([place]    RECOVERED 7 YEARS - COCAINE    Past Surgical History:  Procedure Laterality Date  . ABDOMINAL HYSTERECTOMY     BENIGN MASS  . HERNIA REPAIR  2017    Allergies  Allergen Reactions  . Abilify [Aripiprazole] Other (See Comments)    Tardive dyskinesia  . Codeine Itching    Current Outpatient Prescriptions on File Prior to Visit  Medication Sig Dispense Refill  . acyclovir (ZOVIRAX) 800 MG tablet TAKE 1 TABLET EVERY DAY. 90 tablet 3  . albuterol (PROVENTIL HFA;VENTOLIN HFA) 108 (90 Base) MCG/ACT inhaler Inhale 2 puffs into the lungs every 6 (six) hours as needed for wheezing or shortness of breath. 1 Inhaler 2  . escitalopram (LEXAPRO) 20 MG tablet Take 1 tablet (20 mg total) by mouth daily. 30 tablet 1  . GENVOYA 150-150-200-10 MG TABS tablet TAKE ONE TABLET BY MOUTH ONCE DAILY. 30 tablet 5  . IBU 800 MG tablet TAKE (1) TABLET BY MOUTH EVERY EIGHT HOURS AS NEEDED. 30 tablet 2  . lisinopril-hydrochlorothiazide (PRINZIDE,ZESTORETIC) 10-12.5 MG tablet Take 1 tablet by mouth daily. 90 tablet 3  . montelukast (SINGULAIR) 10 MG tablet Take 1  tablet (10 mg total) by mouth at bedtime. 30 tablet 3  . oxybutynin (DITROPAN-XL) 10 MG 24 hr tablet Take 1 tablet (10 mg total) by mouth at bedtime. 90 tablet 1  . pantoprazole (PROTONIX) 40 MG tablet TAKE ONE TABLET BY MOUTH ONCE DAILY. 90 tablet 1  . traZODone (DESYREL) 100 MG tablet TAKE (1) TABLET BY MOUTH AT BEDTIME. 90 tablet 1   No current facility-administered medications on file prior to visit.         Objective:   Physical Exam Blood pressure 122/70, pulse 76, temperature 98.5 F (36.9 C), height 5' 4"  (1.626 m), weight 201 lb 12.8 oz (91.5 kg), last menstrual period 09/30/2014. Alert and oriented. Skin warm and dry. Oral mucosa is moist.   . Sclera anicteric, conjunctivae is pink. Thyroid not enlarged. No cervical lymphadenopathy. Lungs clear. Heart regular rate and rhythm.  Abdomen is soft. Bowel sounds are positive. No hepatomegaly. No abdominal masses felt. No tenderness. Midline abdominal scar noted from previous surgery  No edema to lower extremities.          Assessment & Plan:  GERD. Am going to get samples of Dexllant. Rx sent to her drug store. Zantac at hs. Do not eat after 7pm.  GERD diet given  to patient OV in 3 months.

## 2017-01-21 ENCOUNTER — Other Ambulatory Visit: Payer: Self-pay | Admitting: Internal Medicine

## 2017-01-26 ENCOUNTER — Other Ambulatory Visit: Payer: Self-pay | Admitting: Internal Medicine

## 2017-02-02 ENCOUNTER — Other Ambulatory Visit: Payer: Self-pay | Admitting: Family Medicine

## 2017-02-03 NOTE — Telephone Encounter (Signed)
Seen 10 18 18

## 2017-02-11 ENCOUNTER — Ambulatory Visit: Payer: Self-pay | Admitting: Family Medicine

## 2017-02-19 ENCOUNTER — Other Ambulatory Visit: Payer: Self-pay | Admitting: Internal Medicine

## 2017-02-23 ENCOUNTER — Other Ambulatory Visit: Payer: Self-pay | Admitting: Family Medicine

## 2017-02-23 NOTE — Telephone Encounter (Signed)
Do you want to rx this for her?

## 2017-02-28 ENCOUNTER — Other Ambulatory Visit: Payer: Self-pay | Admitting: Family Medicine

## 2017-03-02 ENCOUNTER — Telehealth: Payer: Self-pay | Admitting: Family Medicine

## 2017-03-02 NOTE — Telephone Encounter (Signed)
Pt is calling in wants to speak to Dr Meda Coffee, wants to know why her Ibuprofen is being denied, she has arthritis

## 2017-03-02 NOTE — Telephone Encounter (Signed)
Do you want to rx this for Chelsey Henson?

## 2017-03-03 NOTE — Telephone Encounter (Signed)
Called Chelsey Henson, has appt tomorrow.

## 2017-03-04 ENCOUNTER — Encounter: Payer: Self-pay | Admitting: Family Medicine

## 2017-03-04 ENCOUNTER — Other Ambulatory Visit: Payer: Self-pay

## 2017-03-04 ENCOUNTER — Ambulatory Visit: Payer: Medicaid Other | Admitting: Family Medicine

## 2017-03-04 VITALS — BP 114/70 | HR 76 | Temp 97.6°F | Resp 20 | Ht 64.0 in | Wt 200.1 lb

## 2017-03-04 DIAGNOSIS — J4540 Moderate persistent asthma, uncomplicated: Secondary | ICD-10-CM | POA: Diagnosis not present

## 2017-03-04 DIAGNOSIS — L219 Seborrheic dermatitis, unspecified: Secondary | ICD-10-CM | POA: Diagnosis not present

## 2017-03-04 MED ORDER — KETOCONAZOLE 2 % EX SHAM: 1.0000 "application " | MEDICATED_SHAMPOO | CUTANEOUS | 11 refills | Status: DC

## 2017-03-04 NOTE — Progress Notes (Signed)
Chief Complaint  Patient presents with  . Rash   Patient is here for a couple of reasons.  First she has a rash on her face.  It is getting worse over time.  She has never had it looked at before.  She is tried a couple over-the-counter lotions which do not seem to be helping.  She notices in her eye brows, her scalp, and in her ear canals.  He does itch mildly.  It is nowhere else on her body.  Her brother has a similar rash. She also asked for a prescription for ibuprofen.  This is been prescribed for her in the past.  When I pulled it up, however, it said there was an interaction with her HIV medicine genvoya.  I am not comfortable prescribing these until I discussed with her in infectious disease consultant. Third issue is her breathing.  She states her asthma is worse.  She also has obstructive sleep apnea.  She used to be under the care of pulmonary medicine doctor.  She would like to have a pulmonary consultation.  She requests handicap parking due to her asthma.  I told her that she does not meet the criteria for disability based on her intermittent asthma symptoms.  Patient Active Problem List   Diagnosis Date Noted  . Screening examination for venereal disease 11/23/2016  . MDD (major depressive disorder), recurrent episode, moderate ([place]) 09/08/2016  . Cocaine use disorder, moderate, in sustained remission ([place]) 09/08/2016  . Pain, dental 07/19/2016  . Abdominal pain 07/19/2016  . OSA (obstructive sleep apnea) 04/15/2016  . Asthma 04/15/2016  . HIV (human immunodeficiency virus infection) ([place]) 04/05/2016  . Essential hypertension 04/05/2016  . GERD without esophagitis 04/05/2016  . Chronic insomnia 04/05/2016    Outpatient Encounter Medications as of 03/04/2017  Medication Sig  . acyclovir (ZOVIRAX) 800 MG tablet TAKE 1 TABLET EVERY DAY.  Marland Kitchen albuterol (PROVENTIL HFA;VENTOLIN HFA) 108 (90 Base) MCG/ACT inhaler Inhale 2 puffs into the lungs every 6 (six) hours as needed for  wheezing or shortness of breath.  . dexlansoprazole (DEXILANT) 60 MG capsule Take 1 capsule (60 mg total) by mouth daily.  Marland Kitchen escitalopram (LEXAPRO) 20 MG tablet TAKE (1) TABLET BY MOUTH ONCE DAILY.  . GENVOYA 150-150-200-10 MG TABS tablet TAKE ONE TABLET BY MOUTH ONCE DAILY.  . IBU 800 MG tablet TAKE (1) TABLET BY MOUTH EVERY EIGHT HOURS AS NEEDED.  Marland Kitchen lisinopril-hydrochlorothiazide (PRINZIDE,ZESTORETIC) 10-12.5 MG tablet Take 1 tablet by mouth daily.  . montelukast (SINGULAIR) 10 MG tablet Take 1 tablet (10 mg total) by mouth at bedtime.  Marland Kitchen oxybutynin (DITROPAN-XL) 10 MG 24 hr tablet Take 1 tablet (10 mg total) by mouth at bedtime.  . pantoprazole (PROTONIX) 40 MG tablet TAKE ONE TABLET BY MOUTH ONCE DAILY.  . traZODone (DESYREL) 100 MG tablet TAKE (1) TABLET BY MOUTH AT BEDTIME.  Derrill Memo ON 03/07/2017] ketoconazole (NIZORAL) 2 % shampoo Apply 1 application topically 2 (two) times a week.   No facility-administered encounter medications on file as of 03/04/2017.     Allergies  Allergen Reactions  . Abilify [Aripiprazole] Other (See Comments)    Tardive dyskinesia  . Codeine Itching    Review of Systems  Constitutional: Negative for fatigue and unexpected weight change.  HENT: Negative for congestion and dental problem.   Eyes: Negative for photophobia and visual disturbance.  Respiratory: Positive for shortness of breath. Negative for cough and wheezing.   Cardiovascular: Negative for chest pain, palpitations and leg  swelling.  Gastrointestinal: Negative for abdominal distention, constipation and diarrhea.  Genitourinary: Negative for difficulty urinating and frequency.  Skin: Positive for rash.  Psychiatric/Behavioral: The patient is nervous/anxious.        States has panic sometimes.  Refuses counseling or referral  All other systems reviewed and are negative.   BP 114/70 (BP Location: Right Arm, Patient Position: Sitting, Cuff Size: Normal)   Pulse 76   Temp 97.6 F (36.4 C)  (Temporal)   Resp 20   Ht 5' 4"  (1.626 m)   Wt 200 lb 1.3 oz (90.8 kg)   LMP 09/30/2014 (Approximate)   SpO2 97%   BMI 34.34 kg/m   Physical Exam  Constitutional: She appears well-developed and well-nourished.  overweight  HENT:  Head: Normocephalic and atraumatic.  Right Ear: External ear normal.  Left Ear: External ear normal.  Mouth/Throat: Oropharynx is clear and moist.  Eyes: Conjunctivae are normal. Pupils are equal, round, and reactive to light.  Neck: Normal range of motion. No JVD present. No thyromegaly present.  Cardiovascular: Normal rate, regular rhythm and normal heart sounds.  Pulmonary/Chest: Effort normal and breath sounds normal. She has no rales.  Mild tachypnea.  No wheeze  Abdominal: Soft. Bowel sounds are normal. She exhibits no distension.  Musculoskeletal: Normal range of motion. She exhibits no edema.  Skin:  Mildly inflamed, erythematous, flaky rash in eyebrows and scalp consistent with seborrhea  Psychiatric: She has a normal mood and affect. Her behavior is normal. Thought content normal.    ASSESSMENT/PLAN:  1. Seborrhea Discussed treatment.  Prescribed Nizoral  2. Moderate persistent asthma in adult without complication Referred to pulmonary   Patient Instructions  I will contact Dr Linus Salmons about the ibuprofen  we will treat the seborrhea with medication  I have placed referral to pulmonary  See me on usual schedule   Raylene Everts, MD

## 2017-03-04 NOTE — Patient Instructions (Addendum)
I will contact Dr Linus Salmons about the ibuprofen  we will treat the seborrhea with medication  I have placed referral to pulmonary  See me on usual schedule

## 2017-03-07 ENCOUNTER — Other Ambulatory Visit: Payer: Self-pay | Admitting: Family Medicine

## 2017-03-07 ENCOUNTER — Telehealth: Payer: Self-pay | Admitting: Family Medicine

## 2017-03-07 MED ORDER — IBUPROFEN 800 MG PO TABS: ORAL_TABLET | ORAL | 3 refills | Status: DC

## 2017-03-07 NOTE — Telephone Encounter (Signed)
Patient called in to let you know that Chelsey Henson has not received a prescription for the lotion to treat her eczema. Cb#: [phone]

## 2017-03-08 NOTE — Telephone Encounter (Signed)
Called and clarified directions with Noheli.

## 2017-03-08 NOTE — Telephone Encounter (Signed)
Were you going to rx a cream as well?

## 2017-03-08 NOTE — Telephone Encounter (Signed)
I have called [facility], they said they had received it and Chelsey Henson has picked it up.

## 2017-03-08 NOTE — Telephone Encounter (Signed)
No.  It is treated with Nizoral shampoo.  Wash twice a week.  Allow the foam to stay on rash areas a few minutes with shower

## 2017-04-01 ENCOUNTER — Ambulatory Visit (INDEPENDENT_AMBULATORY_CARE_PROVIDER_SITE_OTHER): Payer: Self-pay | Admitting: Internal Medicine

## 2017-04-05 ENCOUNTER — Ambulatory Visit (INDEPENDENT_AMBULATORY_CARE_PROVIDER_SITE_OTHER): Payer: Medicaid Other | Admitting: Internal Medicine

## 2017-04-05 ENCOUNTER — Encounter (INDEPENDENT_AMBULATORY_CARE_PROVIDER_SITE_OTHER): Payer: Self-pay | Admitting: Internal Medicine

## 2017-04-05 VITALS — BP 120/84 | HR 64 | Temp 98.7°F | Ht 64.0 in | Wt 200.6 lb

## 2017-04-05 DIAGNOSIS — K219 Gastro-esophageal reflux disease without esophagitis: Secondary | ICD-10-CM

## 2017-04-05 NOTE — Progress Notes (Signed)
Subjective:    Patient ID: Chelsey Henson, female    DOB: Jun 19, 1970, 47 y.o.   MRN: [id number] 12/2016 wt 201. Ht 64 HPI Here today for f/u. Last seen in November of 2 018. Hx of chronic GERD. She was given samples of Dexilant at [facility] and Rx for same.  GERD diet given to patient and she was asked to follow up in 3 months.  She says the Dexilant is not helping. She is still having acid reflux.  She is following GERD diet.  She coughs up [place] mucous, especially in the morning.  She would like to proceed with an EGD.  Her appetite is good. She has maintained her weight.  No change in her BMs.   Hx significant for HIV, cocaine abuse which she has not used x 8 yrs. Hx of genital herpes.  Review of Systems Past Medical History:  Diagnosis Date  . Anemia   . Anxiety   . Asthma   . Depression   . Genital herpes   . GERD (gastroesophageal reflux disease)   . HIV infection ([place]) 2008  . Hypertension   . Sleep apnea   . Substance abuse ([place])    RECOVERED 7 YEARS - COCAINE    Past Surgical History:  Procedure Laterality Date  . ABDOMINAL HYSTERECTOMY     BENIGN MASS  . HERNIA REPAIR  2017    Allergies  Allergen Reactions  . Abilify [Aripiprazole] Other (See Comments)    Tardive dyskinesia  . Codeine Itching    Current Outpatient Medications on File Prior to Visit  Medication Sig Dispense Refill  . acyclovir (ZOVIRAX) 800 MG tablet TAKE 1 TABLET EVERY DAY. 90 tablet 3  . albuterol (PROVENTIL HFA;VENTOLIN HFA) 108 (90 Base) MCG/ACT inhaler Inhale 2 puffs into the lungs every 6 (six) hours as needed for wheezing or shortness of breath. 1 Inhaler 2  . dexlansoprazole (DEXILANT) 60 MG capsule Take 1 capsule (60 mg total) by mouth daily. 90 capsule 3  . escitalopram (LEXAPRO) 20 MG tablet TAKE (1) TABLET BY MOUTH ONCE DAILY. 90 tablet 1  . GENVOYA 150-150-200-10 MG TABS tablet TAKE ONE TABLET BY MOUTH ONCE DAILY. 30 tablet 5  . ibuprofen (IBU) 800 MG tablet TAKE (1) TABLET BY MOUTH  EVERY EIGHT HOURS AS NEEDED. 50 tablet 3  . ketoconazole (NIZORAL) 2 % shampoo Apply 1 application topically 2 (two) times a week. 120 mL 11  . lisinopril-hydrochlorothiazide (PRINZIDE,ZESTORETIC) 10-12.5 MG tablet Take 1 tablet by mouth daily. 90 tablet 3  . montelukast (SINGULAIR) 10 MG tablet Take 1 tablet (10 mg total) by mouth at bedtime. 30 tablet 3  . oxybutynin (DITROPAN-XL) 10 MG 24 hr tablet Take 1 tablet (10 mg total) by mouth at bedtime. 90 tablet 1  . traZODone (DESYREL) 100 MG tablet TAKE (1) TABLET BY MOUTH AT BEDTIME. 90 tablet 1   No current facility-administered medications on file prior to visit.         Objective:   Physical Exam Blood pressure 120/84, pulse 64, temperature 98.7 F (37.1 C), height 5' 4"  (1.626 m), weight 200 lb 9.6 oz (91 kg), last menstrual period 09/30/2014. Alert and oriented. Skin warm and dry. Oral mucosa is moist.   . Sclera anicteric, conjunctivae is pink. Thyroid not enlarged. No cervical lymphadenopathy. Lungs clear. Heart regular rate and rhythm.  Abdomen is soft. Bowel sounds are positive. No hepatomegaly. No abdominal masses felt. No tenderness.  No edema to lower extremities.  Assessment & Plan:  GERD. Uncontrolled. Will proceed with an EGD. May take Zantac at night. EGD. The risks of bleeding, perforation and infection were reviewed with patient.

## 2017-04-05 NOTE — Patient Instructions (Signed)
EGD. The risks of bleeding, perforation and infection were reviewed with patient.

## 2017-04-06 ENCOUNTER — Encounter (INDEPENDENT_AMBULATORY_CARE_PROVIDER_SITE_OTHER): Payer: Self-pay | Admitting: *Deleted

## 2017-04-06 DIAGNOSIS — K219 Gastro-esophageal reflux disease without esophagitis: Secondary | ICD-10-CM | POA: Insufficient documentation

## 2017-04-19 ENCOUNTER — Other Ambulatory Visit: Payer: Self-pay | Admitting: Internal Medicine

## 2017-04-19 ENCOUNTER — Other Ambulatory Visit: Payer: Self-pay | Admitting: Family Medicine

## 2017-04-20 NOTE — Telephone Encounter (Signed)
Seen 1 4 19

## 2017-05-09 ENCOUNTER — Emergency Department (HOSPITAL_COMMUNITY)
Admission: EM | Admit: 2017-05-09 | Discharge: 2017-05-09 | Disposition: A | Discharge status: Home / Self Care | Payer: Medicaid Other | Attending: Emergency Medicine | Admitting: Emergency Medicine

## 2017-05-09 ENCOUNTER — Encounter: Payer: Self-pay | Admitting: Family Medicine

## 2017-05-09 ENCOUNTER — Emergency Department (HOSPITAL_COMMUNITY): Payer: Medicaid Other

## 2017-05-09 ENCOUNTER — Other Ambulatory Visit: Payer: Self-pay

## 2017-05-09 ENCOUNTER — Encounter (HOSPITAL_COMMUNITY): Payer: Self-pay | Admitting: Emergency Medicine

## 2017-05-09 DIAGNOSIS — F419 Anxiety disorder, unspecified: Secondary | ICD-10-CM | POA: Insufficient documentation

## 2017-05-09 DIAGNOSIS — Z21 Asymptomatic human immunodeficiency virus [HIV] infection status: Secondary | ICD-10-CM | POA: Insufficient documentation

## 2017-05-09 DIAGNOSIS — I10 Essential (primary) hypertension: Secondary | ICD-10-CM | POA: Diagnosis not present

## 2017-05-09 DIAGNOSIS — W228XXA Striking against or struck by other objects, initial encounter: Secondary | ICD-10-CM | POA: Insufficient documentation

## 2017-05-09 DIAGNOSIS — Z79899 Other long term (current) drug therapy: Secondary | ICD-10-CM | POA: Diagnosis not present

## 2017-05-09 DIAGNOSIS — F329 Major depressive disorder, single episode, unspecified: Secondary | ICD-10-CM | POA: Insufficient documentation

## 2017-05-09 DIAGNOSIS — M79674 Pain in right toe(s): Secondary | ICD-10-CM

## 2017-05-09 DIAGNOSIS — J45909 Unspecified asthma, uncomplicated: Secondary | ICD-10-CM | POA: Diagnosis not present

## 2017-05-09 MED ORDER — HYDROCODONE-ACETAMINOPHEN 5-325 MG PO TABS: 1.0000 | ORAL_TABLET | Freq: Four times a day (QID) | ORAL | 0 refills | Status: DC | PRN

## 2017-05-09 NOTE — ED Provider Notes (Signed)
[facility] EMERGENCY DEPARTMENT Provider Note   CSN: [id number] Arrival date & time: 05/09/17  1553     History   Chief Complaint Chief Complaint  Patient presents with  . Toe Pain    HPI Chelsey Henson is a 47 y.o. female.  Patient states she tripped over her dog at home, and struck her right great toe on the metal bed frame. She did not fall.   The history is provided by the patient. No language interpreter was used.  Foot Pain  This is a new problem. The current episode started 3 to 5 hours ago. The problem occurs constantly. The problem has not changed since onset.The symptoms are aggravated by walking.    Past Medical History:  Diagnosis Date  . Anemia   . Anxiety   . Asthma   . Depression   . Genital herpes   . GERD (gastroesophageal reflux disease)   . HIV infection ([place]) 2008  . Hypertension   . Sleep apnea   . Substance abuse ([place])    RECOVERED 7 YEARS - COCAINE    Patient Active Problem List   Diagnosis Date Noted  . Gastroesophageal reflux disease without esophagitis 04/06/2017  . Screening examination for venereal disease 11/23/2016  . MDD (major depressive disorder), recurrent episode, moderate ([place]) 09/08/2016  . Cocaine use disorder, moderate, in sustained remission ([place]) 09/08/2016  . Pain, dental 07/19/2016  . Abdominal pain 07/19/2016  . OSA (obstructive sleep apnea) 04/15/2016  . Asthma 04/15/2016  . HIV (human immunodeficiency virus infection) ([place]) 04/05/2016  . Essential hypertension 04/05/2016  . GERD without esophagitis 04/05/2016  . Chronic insomnia 04/05/2016    Past Surgical History:  Procedure Laterality Date  . ABDOMINAL HYSTERECTOMY     BENIGN MASS  . HERNIA REPAIR  2017    OB History    No data available       Home Medications    Prior to Admission medications   Medication Sig Start Date End Date Taking? Authorizing Provider  acyclovir (ZOVIRAX) 800 MG tablet TAKE 1 TABLET EVERY DAY. 11/02/16   Raylene Everts,  MD  albuterol (PROVENTIL HFA;VENTOLIN HFA) 108 (90 Base) MCG/ACT inhaler Inhale 2 puffs into the lungs every 6 (six) hours as needed for wheezing or shortness of breath. 12/16/16   Raylene Everts, MD  dexlansoprazole (DEXILANT) 60 MG capsule Take 1 capsule (60 mg total) by mouth daily. 12/31/16   Setzer, Rona Ravens, NP  escitalopram (LEXAPRO) 20 MG tablet TAKE (1) TABLET BY MOUTH ONCE DAILY. 02/03/17   Raylene Everts, MD  GENVOYA 150-150-200-10 MG TABS tablet TAKE ONE TABLET BY MOUTH ONCE DAILY. 04/19/17   Thayer Headings, MD  ibuprofen (IBU) 800 MG tablet TAKE (1) TABLET BY MOUTH EVERY EIGHT HOURS AS NEEDED. 03/07/17   Raylene Everts, MD  ketoconazole (NIZORAL) 2 % shampoo Apply 1 application topically 2 (two) times a week. 03/07/17   Raylene Everts, MD  lisinopril-hydrochlorothiazide (PRINZIDE,ZESTORETIC) 10-12.5 MG tablet Take 1 tablet by mouth daily. 11/16/16   Raylene Everts, MD  montelukast (SINGULAIR) 10 MG tablet TAKE (1) TABLET BY MOUTH AT BEDTIME. 04/20/17   Raylene Everts, MD  oxybutynin (DITROPAN-XL) 10 MG 24 hr tablet Take 1 tablet (10 mg total) by mouth at bedtime. 05/04/16   Raylene Everts, MD  traZODone (DESYREL) 100 MG tablet TAKE (1) TABLET BY MOUTH AT BEDTIME. 12/28/16   Raylene Everts, MD    Family History Family History  Problem Relation Age  of Onset  . Depression Mother   . Hyperlipidemia Mother   . Learning disabilities Mother   . Alcohol abuse Mother 57  . Alcohol abuse Father   . Arthritis Father   . Cancer Father   . Heart disease Father   . Hypertension Father   . Learning disabilities Father   . Alcohol abuse Sister        RECOVERED  . Alcohol abuse Brother   . Alcohol abuse Brother   . Alcohol abuse Brother     Social History Social History   Tobacco Use  . Smoking status: Never Smoker  . Smokeless tobacco: Never Used  Substance Use Topics  . Alcohol use: Yes    Alcohol/week: 0.6 oz    Types: 1 Cans of beer per week    Comment:  occ.  . Drug use: No    Comment: none in 7 years     Allergies   Abilify [aripiprazole] and Codeine   Review of Systems Review of Systems  Musculoskeletal: Positive for arthralgias.  All other systems reviewed and are negative.    Physical Exam Updated Vital Signs BP 108/81 (BP Location: Right Arm)   Pulse 81   Temp 98.4 F (36.9 C) (Oral)   Resp 16   Ht 5' 4"  (1.626 m)   Wt 90.7 kg (200 lb)   LMP 09/30/2014 (Approximate)   SpO2 93%   BMI 34.33 kg/m   Physical Exam  Constitutional: She is oriented to person, place, and time. She appears well-developed and well-nourished.  HENT:  Head: Atraumatic.  Eyes: Conjunctivae are normal.  Neck: Neck supple.  Cardiovascular: Normal rate and regular rhythm.  Pulmonary/Chest: Effort normal and breath sounds normal.  Abdominal: Soft. Bowel sounds are normal.  Musculoskeletal: She exhibits tenderness.       Right foot: There is decreased range of motion and tenderness.       Feet:  Tenderness over the proximal aspect of the right great toe. Limited ROM due to pain. Distal sensation intact with brisk capillary refill.  Neurological: She is alert and oriented to person, place, and time.  Skin: Skin is warm and dry.  Psychiatric: She has a normal mood and affect.  Nursing note and vitals reviewed.    ED Treatments / Results  Labs (all labs ordered are listed, but only abnormal results are displayed) Labs Reviewed - No data to display  EKG  EKG Interpretation None       Radiology Dg Foot Complete Right  Result Date: 05/09/2017 CLINICAL DATA:  47 year old female with pain radiating through the foot and at the big toe after blunt trauma. Struck foot on metal bed frame. EXAM: RIGHT FOOT COMPLETE - 3+ VIEW COMPARISON:  None. FINDINGS: Bone mineralization is within normal limits. Joint spaces and alignment are preserved throughout the right foot. The tarsal bones, metatarsals, and phalanges appear intact. No discrete soft  tissue injury identified. IMPRESSION: Negative. Electronically Signed   By: Genevie Ann M.D.   On: 05/09/2017 16:31    Procedures Procedures (including critical care time)  Medications Ordered in ED Medications - No data to display   Initial Impression / Assessment and Plan / ED Course  I have reviewed the triage vital signs and the nursing notes.  Pertinent labs & imaging results that were available during my care of the patient were reviewed by me and considered in my medical decision making (see chart for details).     Patient X-Ray negative for obvious fracture or  dislocation.  Pt advised to follow up with orthopedics. Patient given post op shoe and crutches while in ED, conservative therapy recommended and discussed. Patient will be discharged home & is agreeable with above plan. Returns precautions discussed. Pt appears safe for discharge.  Final Clinical Impressions(s) / ED Diagnoses   Final diagnoses:  Great toe pain, right    ED Discharge Orders        Ordered    HYDROcodone-acetaminophen (NORCO) 5-325 MG tablet  Every 6 hours PRN     05/09/17 1737       Etta Quill, NP 05/09/17 1739    Mesner, Corene Cornea, MD 05/10/17 6861

## 2017-05-09 NOTE — ED Triage Notes (Signed)
PT states she hit her right foot great toe on her metal bed frame and c/o pain with trying to ambulate.

## 2017-05-10 ENCOUNTER — Other Ambulatory Visit: Payer: Self-pay

## 2017-05-16 ENCOUNTER — Other Ambulatory Visit: Payer: Medicaid Other

## 2017-05-16 ENCOUNTER — Other Ambulatory Visit: Payer: Self-pay | Admitting: Family Medicine

## 2017-05-16 ENCOUNTER — Other Ambulatory Visit (HOSPITAL_COMMUNITY)
Admission: RE | Admit: 2017-05-16 | Discharge: 2017-05-16 | Disposition: A | Discharge status: Home / Self Care | Payer: Medicaid Other | Source: Ambulatory Visit | Attending: Internal Medicine | Admitting: Internal Medicine

## 2017-05-16 DIAGNOSIS — B20 Human immunodeficiency virus [HIV] disease: Secondary | ICD-10-CM

## 2017-05-16 DIAGNOSIS — Z113 Encounter for screening for infections with a predominantly sexual mode of transmission: Secondary | ICD-10-CM | POA: Insufficient documentation

## 2017-05-17 LAB — COMPLETE METABOLIC PANEL WITH GFR
AG Ratio: 1.4 (calc) (ref 1.0–2.5)
ALBUMIN MSPROF: 4.7 g/dL (ref 3.6–5.1)
ALT: 110 U/L — ABNORMAL HIGH (ref 6–29)
AST: 56 U/L — ABNORMAL HIGH (ref 10–35)
Alkaline phosphatase (APISO): 57 U/L (ref 33–115)
BUN: 17 mg/dL (ref 7–25)
CO2: 29 mmol/L (ref 20–32)
Calcium: 9.8 mg/dL (ref 8.6–10.2)
Chloride: 103 mmol/L (ref 98–110)
Creat: 0.61 mg/dL (ref 0.50–1.10)
GFR, EST AFRICAN AMERICAN: 127 mL/min/{1.73_m2} (ref >=60)
GFR, Est Non African American: 109 mL/min/{1.73_m2} (ref >=60)
GLUCOSE: 97 mg/dL (ref 65–99)
Globulin: 3.3 g/dL (calc) (ref 1.9–3.7)
Potassium: 4.3 mmol/L (ref 3.5–5.3)
Sodium: 138 mmol/L (ref 135–146)
TOTAL PROTEIN: 8 g/dL (ref 6.1–8.1)
Total Bilirubin: 0.6 mg/dL (ref 0.2–1.2)

## 2017-05-17 LAB — RPR: RPR Ser Ql: NONREACTIVE

## 2017-05-17 LAB — CBC WITH DIFFERENTIAL/PLATELET
BASOS ABS: 69 {cells}/uL (ref 0–200)
Basophils Relative: 1 %
EOS PCT: 2 %
Eosinophils Absolute: 138 cells/uL (ref 15–500)
HEMATOCRIT: 40.9 % (ref 35.0–45.0)
HEMOGLOBIN: 14.5 g/dL (ref 11.7–15.5)
LYMPHS ABS: 2236 {cells}/uL (ref 850–3900)
MCH: 32 pg (ref 27.0–33.0)
MCHC: 35.5 g/dL (ref 32.0–36.0)
MCV: 90.3 fL (ref 80.0–100.0)
MPV: 11.4 fL (ref 7.5–12.5)
Monocytes Relative: 8.2 %
NEUTROS ABS: 3892 {cells}/uL (ref 1500–7800)
NEUTROS PCT: 56.4 %
Platelets: 269 10*3/uL (ref 140–400)
RBC: 4.53 10*6/uL (ref 3.80–5.10)
RDW: 12.7 % (ref 11.0–15.0)
Total Lymphocyte: 32.4 %
WBC: 6.9 10*3/uL (ref 3.8–10.8)
WBCMIX: 566 {cells}/uL (ref 200–950)

## 2017-05-17 LAB — T-HELPER CELL (CD4) - (RCID CLINIC ONLY)
CD4 T CELL HELPER: 34 % (ref 33–55)
CD4 T Cell Abs: 820 /uL (ref 400–2700)

## 2017-05-17 LAB — URINE CYTOLOGY ANCILLARY ONLY
CHLAMYDIA, DNA PROBE: NEGATIVE
NEISSERIA GONORRHEA: NEGATIVE

## 2017-05-18 ENCOUNTER — Other Ambulatory Visit: Payer: Self-pay

## 2017-05-18 ENCOUNTER — Other Ambulatory Visit: Payer: Self-pay | Admitting: Family Medicine

## 2017-05-18 ENCOUNTER — Encounter: Payer: Self-pay | Admitting: Family Medicine

## 2017-05-18 ENCOUNTER — Ambulatory Visit: Payer: Medicaid Other | Admitting: Family Medicine

## 2017-05-18 VITALS — BP 108/64 | HR 84 | Temp 98.1°F | Resp 16 | Ht 64.0 in | Wt 201.2 lb

## 2017-05-18 DIAGNOSIS — N951 Menopausal and female climacteric states: Secondary | ICD-10-CM | POA: Diagnosis not present

## 2017-05-18 DIAGNOSIS — Z1231 Encounter for screening mammogram for malignant neoplasm of breast: Secondary | ICD-10-CM | POA: Diagnosis not present

## 2017-05-18 DIAGNOSIS — R945 Abnormal results of liver function studies: Secondary | ICD-10-CM | POA: Diagnosis not present

## 2017-05-18 DIAGNOSIS — R7989 Other specified abnormal findings of blood chemistry: Secondary | ICD-10-CM

## 2017-05-18 LAB — HIV-1 RNA QUANT-NO REFLEX-BLD
HIV 1 RNA Quant: <20 copies/mL — AB
HIV-1 RNA Quant, Log: <1.3 Log copies/mL — AB

## 2017-05-18 MED ORDER — ESTRADIOL 0.025 MG/24HR TD PTWK: 0.0500 mg | MEDICATED_PATCH | TRANSDERMAL | 1 refills | Status: DC

## 2017-05-18 NOTE — Patient Instructions (Signed)
Need labs today Need mammogram Try the patch  See me in a month

## 2017-05-18 NOTE — Progress Notes (Signed)
Chief Complaint  Patient presents with  . Hot Flashes   Patient is here for an acute visit. She states that she had a hysterectomy 2 years ago with both ovaries removed for a benign abdominal mass.  Initially she was treated with oral estrogen, but she states it upset her stomach so she stopped it.  She was having tolerable hot flashes at night until 3 or 4 days ago.  For the last 3 or 4 days she has been having repeated hot flashes that interrupt her sleep, and then chills when she cools.  It does not feel like a fever, does feel like hot flashes.  She has no cough cold runny nose sore throat, nausea vomiting diarrhea or symptoms of any virus.  She states she otherwise has felt well.  She wants to know if she can have alternate treatment for her hot flashes.  She has blood work regularly through infectious disease.  She recently had blood work done for a scheduled visit next week.  I note that her liver functions are elevated. Results for LEONI, GOODNESS (MRN [id number]) as of 05/18/2017 10:52  Ref. Range 06/28/2016 09:51 06/28/2016 09:51 07/13/2016 14:27 07/13/2016 14:28 05/16/2017 11:15  AST Latest Ref Range: 10 - 35 U/L 31 (H) 31 (H) 32  56 (H)  ALT Latest Ref Range: 6 - 29 U/L 60 (H) 60 (H) 71 (H)  110 (H)   We discussed elevated liver functions.  No new medications.  No overuse of Tylenol.  No alcohol use at all.  No recent viral illness.  No change in weight, diet, or activity.  Patient Active Problem List   Diagnosis Date Noted  . Gastroesophageal reflux disease without esophagitis 04/06/2017  . Screening examination for venereal disease 11/23/2016  . MDD (major depressive disorder), recurrent episode, moderate ([place]) 09/08/2016  . Cocaine use disorder, moderate, in sustained remission ([place]) 09/08/2016  . Pain, dental 07/19/2016  . Abdominal pain 07/19/2016  . OSA (obstructive sleep apnea) 04/15/2016  . Asthma 04/15/2016  . HIV (human immunodeficiency virus infection) ([place]) 04/05/2016    . Essential hypertension 04/05/2016  . GERD without esophagitis 04/05/2016  . Chronic insomnia 04/05/2016    Outpatient Encounter Medications as of 05/18/2017  Medication Sig  . acyclovir (ZOVIRAX) 800 MG tablet TAKE 1 TABLET EVERY DAY.  Marland Kitchen albuterol (PROVENTIL HFA;VENTOLIN HFA) 108 (90 Base) MCG/ACT inhaler Inhale 2 puffs into the lungs every 6 (six) hours as needed for wheezing or shortness of breath.  . dexlansoprazole (DEXILANT) 60 MG capsule Take 1 capsule (60 mg total) by mouth daily.  Marland Kitchen escitalopram (LEXAPRO) 20 MG tablet TAKE (1) TABLET BY MOUTH ONCE DAILY.  . GENVOYA 150-150-200-10 MG TABS tablet TAKE ONE TABLET BY MOUTH ONCE DAILY.  Marland Kitchen HYDROcodone-acetaminophen (NORCO) 5-325 MG tablet Take 1 tablet by mouth every 6 (six) hours as needed for severe pain.  Marland Kitchen ibuprofen (IBU) 800 MG tablet TAKE (1) TABLET BY MOUTH EVERY EIGHT HOURS AS NEEDED.  Marland Kitchen ketoconazole (NIZORAL) 2 % shampoo Apply 1 application topically 2 (two) times a week.  Marland Kitchen lisinopril-hydrochlorothiazide (PRINZIDE,ZESTORETIC) 10-12.5 MG tablet Take 1 tablet by mouth daily.  . montelukast (SINGULAIR) 10 MG tablet TAKE (1) TABLET BY MOUTH AT BEDTIME.  Marland Kitchen oxybutynin (DITROPAN-XL) 10 MG 24 hr tablet Take 1 tablet (10 mg total) by mouth at bedtime.  . traZODone (DESYREL) 100 MG tablet TAKE (1) TABLET BY MOUTH AT BEDTIME.  Marland Kitchen estradiol (CLIMARA - DOSED IN MG/24 HR) 0.025 mg/24hr patch Place 2 patches (0.05  mg total) onto the skin once a week.   No facility-administered encounter medications on file as of 05/18/2017.     Allergies  Allergen Reactions  . Abilify [Aripiprazole] Other (See Comments)    Tardive dyskinesia  . Codeine Itching    Review of Systems  Constitutional: Positive for diaphoresis. Negative for fatigue and unexpected weight change.       Hot flashes  HENT: Negative for congestion and dental problem.   Eyes: Negative for photophobia and visual disturbance.  Respiratory: Negative for cough, shortness of breath  and wheezing.   Cardiovascular: Negative for chest pain, palpitations and leg swelling.  Gastrointestinal: Negative for abdominal distention, constipation and diarrhea.  Genitourinary: Negative for difficulty urinating and frequency.  Skin: Negative for rash.  Psychiatric/Behavioral: The patient is nervous/anxious.        States has panic sometimes.  Refuses counseling or referral  All other systems reviewed and are negative.   BP 108/64 (BP Location: Left Arm, Patient Position: Sitting, Cuff Size: Normal)   Pulse 84   Temp 98.1 F (36.7 C) (Temporal)   Resp 16   Ht 5' 4"  (1.626 m)   Wt 201 lb 4 oz (91.3 kg)   LMP 09/30/2014 (Approximate)   SpO2 97%   BMI 34.54 kg/m   Physical Exam  Constitutional: She is oriented to person, place, and time. She appears well-developed and well-nourished. No distress.  HENT:  Head: Normocephalic and atraumatic.  Mouth/Throat: Oropharynx is clear and moist.  Eyes: Conjunctivae are normal. Pupils are equal, round, and reactive to light.  Neck: Normal range of motion.  Cardiovascular: Normal rate, regular rhythm and normal heart sounds.  Pulmonary/Chest: Effort normal.  Abdominal: Soft. Bowel sounds are normal.  No tenderness.  No hepatomegaly.  Lymphadenopathy:    She has no cervical adenopathy.  Neurological: She is alert and oriented to person, place, and time.  Psychiatric:  Normal mood and affect, slightly hyperkinetic jittery behavior    ASSESSMENT/PLAN:  1. Hot flashes due to menopause - FSH/LH Discussed hormone replacement is appropriate when people have their ovaries removed at a young age.  Since she did not tolerate estrogen tablets we can try Climara patches 28 days a month.  She is not up-to-date with mammogram I told her that if she is on hormone replacement a mammogram yearly is essential.  No family history of mammogram or history of positive mammograms in the past.  She understands that I am leaving the practice in 6 weeks and  she will have to find another provider to continue her prescription.  2. Breast cancer screening by mammogram  3.  Elevated liver function test - Hepatic function panel May need abdominal ultrasound.  Patient Instructions  Need labs today Need mammogram Try the patch  See me in a month   Raylene Everts, MD

## 2017-05-19 ENCOUNTER — Other Ambulatory Visit: Payer: Self-pay | Admitting: Family Medicine

## 2017-05-19 ENCOUNTER — Telehealth: Payer: Self-pay | Admitting: Family Medicine

## 2017-05-19 LAB — HEPATIC FUNCTION PANEL
AG RATIO: 1.5 (calc) (ref 1.0–2.5)
ALBUMIN MSPROF: 4.8 g/dL (ref 3.6–5.1)
ALT: 120 U/L — ABNORMAL HIGH (ref 6–29)
AST: 61 U/L — AB (ref 10–35)
Alkaline phosphatase (APISO): 56 U/L (ref 33–115)
BILIRUBIN DIRECT: 0.2 mg/dL (ref 0.0–0.2)
BILIRUBIN TOTAL: 0.7 mg/dL (ref 0.2–1.2)
Globulin: 3.3 g/dL (calc) (ref 1.9–3.7)
Indirect Bilirubin: 0.5 mg/dL (calc) (ref 0.2–1.2)
Total Protein: 8.1 g/dL (ref 6.1–8.1)

## 2017-05-19 LAB — FSH/LH
FSH: 45.1 m[IU]/mL
LH: 19.7 m[IU]/mL

## 2017-05-19 NOTE — Telephone Encounter (Signed)
Pharmacy notified.

## 2017-05-19 NOTE — Telephone Encounter (Signed)
Trip from [facility]  Prescription for 2 climara patches once a week is not covered by insurance.  Insurance will only pay for 1 patch per week. Requesting .05 instead of .025 dosage.

## 2017-05-19 NOTE — Telephone Encounter (Signed)
I didn't notice the computer doubled the dose - she needs one patch a week at the 0.25 dose.  Call her with the change.

## 2017-05-19 NOTE — Telephone Encounter (Signed)
Please advise 

## 2017-05-20 ENCOUNTER — Ambulatory Visit (HOSPITAL_COMMUNITY): Payer: Self-pay

## 2017-05-23 ENCOUNTER — Encounter: Payer: Self-pay | Admitting: Family Medicine

## 2017-05-23 DIAGNOSIS — R7989 Other specified abnormal findings of blood chemistry: Secondary | ICD-10-CM

## 2017-05-23 DIAGNOSIS — R945 Abnormal results of liver function studies: Principal | ICD-10-CM

## 2017-05-24 ENCOUNTER — Ambulatory Visit (INDEPENDENT_AMBULATORY_CARE_PROVIDER_SITE_OTHER): Payer: Medicaid Other | Admitting: Internal Medicine

## 2017-05-24 ENCOUNTER — Telehealth: Payer: Self-pay | Admitting: Family Medicine

## 2017-05-24 ENCOUNTER — Encounter: Payer: Self-pay | Admitting: Internal Medicine

## 2017-05-24 VITALS — BP 104/68 | HR 67 | Temp 98.4°F | Resp 18 | Ht 63.0 in | Wt 186.0 lb

## 2017-05-24 DIAGNOSIS — Z113 Encounter for screening for infections with a predominantly sexual mode of transmission: Secondary | ICD-10-CM | POA: Diagnosis not present

## 2017-05-24 DIAGNOSIS — B20 Human immunodeficiency virus [HIV] disease: Secondary | ICD-10-CM | POA: Diagnosis present

## 2017-05-24 DIAGNOSIS — R7401 Elevation of levels of liver transaminase levels: Secondary | ICD-10-CM | POA: Insufficient documentation

## 2017-05-24 DIAGNOSIS — Z7189 Other specified counseling: Secondary | ICD-10-CM | POA: Diagnosis not present

## 2017-05-24 DIAGNOSIS — Z7185 Encounter for immunization safety counseling: Secondary | ICD-10-CM | POA: Insufficient documentation

## 2017-05-24 DIAGNOSIS — R740 Nonspecific elevation of levels of transaminase and lactic acid dehydrogenase [LDH]: Secondary | ICD-10-CM

## 2017-05-24 MED ORDER — HEPATITIS B VAC RECOMBINANT 10 MCG/0.5ML IJ SUSP
0.5000 mL | Freq: Once | INTRAMUSCULAR | Status: AC
  Administered 2017-05-24: 0.5 mL via INTRAMUSCULAR

## 2017-05-24 NOTE — Assessment & Plan Note (Signed)
Recent hepatitis panel with negative hepatitis B and only previously completed 2 doses.  I will start the series over today.   Nurse visit in 1 month for #2 Will get #3 next visit with me.

## 2017-05-24 NOTE — Assessment & Plan Note (Addendum)
Doing well with this. Ok to take ibuprofen with genvoya with normal creat and a TAF-containing regimen.    rtc 6 months.

## 2017-05-24 NOTE — Telephone Encounter (Signed)
 Please advise. Thank you   - S.

## 2017-05-24 NOTE — Progress Notes (Signed)
   Subjective:    Patient ID: Chelsey Henson, female    DOB: 1970/05/19, 47 y.o.   MRN: [id number]  HPI Here for follow up of HIV Continues on Genvoya and no missed doses.  CD4 of 820, viral load < 20.  Having some worsening transaminitis.  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  Recently started estrogen patch.  No missed doses.  No associated n/d/rash.     Review of Systems  Gastrointestinal: Negative for diarrhea and nausea.  Skin: Negative for rash.       Objective:   Physical Exam  Constitutional: She appears well-developed and well-nourished. No distress.  HENT:  Mouth/Throat: No oropharyngeal exudate.  Eyes: No scleral icterus.  Cardiovascular: Normal rate, regular rhythm and normal heart sounds.  No murmur heard. Pulmonary/Chest: Effort normal and breath sounds normal. No respiratory distress.  Skin: No rash noted.   SH: no tobacco       Assessment & Plan:

## 2017-05-24 NOTE — Telephone Encounter (Signed)
Pt is questioning her referral to the Lung dr, and the MRI\CT scan --Please call her

## 2017-05-24 NOTE — Assessment & Plan Note (Signed)
No known cause with some recent worsening of AST, ALT.  Has an ultrasound ordered, ? Fatty liver.  Hepatitis panel.  No medications that are typical for this.  Being worked up by her PCP.

## 2017-05-24 NOTE — Assessment & Plan Note (Signed)
Screened negative

## 2017-05-25 ENCOUNTER — Telehealth: Payer: Self-pay | Admitting: Family Medicine

## 2017-05-25 NOTE — Telephone Encounter (Signed)
[place] scheduled.

## 2017-05-25 NOTE — Telephone Encounter (Signed)
She is referred for an ultrasound of her liver because her liver test is abnormal. I have not placed referral for lungs

## 2017-05-25 NOTE — Telephone Encounter (Signed)
Patient called in to check the status of a pulmonary referral. I do not have a pulmonary referral for this patient.  Please advise.

## 2017-05-26 NOTE — Telephone Encounter (Signed)
Left message asking patient to call back regarding pulmonary referral.

## 2017-05-31 ENCOUNTER — Ambulatory Visit (HOSPITAL_COMMUNITY): Admission: RE | Admit: 2017-05-31 | Payer: Medicaid Other | Source: Ambulatory Visit

## 2017-06-01 ENCOUNTER — Other Ambulatory Visit (HOSPITAL_COMMUNITY): Payer: Medicaid Other

## 2017-06-01 ENCOUNTER — Ambulatory Visit (HOSPITAL_COMMUNITY): Payer: Self-pay

## 2017-06-06 ENCOUNTER — Ambulatory Visit (HOSPITAL_COMMUNITY)
Admission: RE | Admit: 2017-06-06 | Discharge: 2017-06-06 | Disposition: A | Discharge status: Home / Self Care | Payer: Medicaid Other | Source: Ambulatory Visit | Attending: Family Medicine | Admitting: Family Medicine

## 2017-06-06 ENCOUNTER — Encounter: Payer: Self-pay | Admitting: Family Medicine

## 2017-06-06 ENCOUNTER — Other Ambulatory Visit: Payer: Self-pay | Admitting: Family Medicine

## 2017-06-06 ENCOUNTER — Encounter (HOSPITAL_COMMUNITY): Payer: Self-pay

## 2017-06-06 DIAGNOSIS — Z1231 Encounter for screening mammogram for malignant neoplasm of breast: Secondary | ICD-10-CM | POA: Insufficient documentation

## 2017-06-06 DIAGNOSIS — R7989 Other specified abnormal findings of blood chemistry: Secondary | ICD-10-CM

## 2017-06-06 DIAGNOSIS — R945 Abnormal results of liver function studies: Secondary | ICD-10-CM | POA: Insufficient documentation

## 2017-06-06 DIAGNOSIS — R928 Other abnormal and inconclusive findings on diagnostic imaging of breast: Secondary | ICD-10-CM

## 2017-06-14 ENCOUNTER — Encounter (HOSPITAL_COMMUNITY): Payer: Self-pay

## 2017-06-14 ENCOUNTER — Ambulatory Visit (HOSPITAL_COMMUNITY)
Admission: RE | Admit: 2017-06-14 | Discharge: 2017-06-14 | Disposition: A | Discharge status: Home / Self Care | Payer: Medicaid Other | Source: Ambulatory Visit | Attending: Family Medicine | Admitting: Family Medicine

## 2017-06-14 DIAGNOSIS — R928 Other abnormal and inconclusive findings on diagnostic imaging of breast: Secondary | ICD-10-CM

## 2017-06-15 ENCOUNTER — Other Ambulatory Visit: Payer: Self-pay | Admitting: Internal Medicine

## 2017-06-15 ENCOUNTER — Other Ambulatory Visit: Payer: Self-pay | Admitting: Family Medicine

## 2017-06-16 ENCOUNTER — Ambulatory Visit: Payer: Self-pay | Admitting: Family Medicine

## 2017-06-20 ENCOUNTER — Other Ambulatory Visit: Payer: Self-pay

## 2017-06-20 ENCOUNTER — Encounter: Payer: Self-pay | Admitting: Family Medicine

## 2017-06-20 ENCOUNTER — Ambulatory Visit: Payer: Medicaid Other | Admitting: Family Medicine

## 2017-06-20 VITALS — BP 112/70 | HR 78 | Temp 98.6°F | Resp 14 | Ht 64.0 in | Wt 200.1 lb

## 2017-06-20 DIAGNOSIS — R945 Abnormal results of liver function studies: Secondary | ICD-10-CM | POA: Diagnosis not present

## 2017-06-20 DIAGNOSIS — I10 Essential (primary) hypertension: Secondary | ICD-10-CM

## 2017-06-20 DIAGNOSIS — K219 Gastro-esophageal reflux disease without esophagitis: Secondary | ICD-10-CM | POA: Diagnosis not present

## 2017-06-20 DIAGNOSIS — N951 Menopausal and female climacteric states: Secondary | ICD-10-CM | POA: Diagnosis not present

## 2017-06-20 DIAGNOSIS — R7989 Other specified abnormal findings of blood chemistry: Secondary | ICD-10-CM

## 2017-06-20 MED ORDER — OXYBUTYNIN CHLORIDE ER 10 MG PO TB24: ORAL_TABLET | ORAL | 0 refills | Status: DC

## 2017-06-20 MED ORDER — TRAZODONE HCL 100 MG PO TABS: ORAL_TABLET | ORAL | 0 refills | Status: DC

## 2017-06-20 NOTE — Patient Instructions (Signed)
You are doing well  No change in medicine  Need follow up every 6 months

## 2017-06-20 NOTE — Progress Notes (Signed)
Chief Complaint  Patient presents with  . Menopause    follow up on bloodwork  Patient is here for routine follow-up She wants to discuss her recent blood work and testing Because of elevated liver function test I did a abdominal ultrasound.  It showed fatty liver with no other abnormality.  She is scheduled to see GI. She is doing well with HIV, is compliant with medication Her blood pressure is well controlled She is back to work part-time.  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 She does not have any trouble with her asthma or OSA She had a hysterectomy at an early age.  At last visit we discussed her hot flashes were intolerable.  I put her on a low-dose of estrogen patch.  She states that this is made a big difference in her hot flashes and her overall well-being. We discussed that I am leaving this practice and she needs to establish with a new primary care.  I will make sure all her medicines have refills till she can be seen.  Patient Active Problem List   Diagnosis Date Noted  . Transaminitis 05/24/2017  . Vaccine counseling 05/24/2017  . Gastroesophageal reflux disease without esophagitis 04/06/2017  . MDD (major depressive disorder), recurrent episode, moderate ([place]) 09/08/2016  . Cocaine use disorder, moderate, in sustained remission ([place]) 09/08/2016  . Abdominal pain 07/19/2016  . OSA (obstructive sleep apnea) 04/15/2016  . Asthma 04/15/2016  . HIV (human immunodeficiency virus infection) ([place]) 04/05/2016  . Essential hypertension 04/05/2016  . Chronic insomnia 04/05/2016    Outpatient Encounter Medications as of 06/20/2017  Medication Sig  . acyclovir (ZOVIRAX) 800 MG tablet TAKE 1 TABLET EVERY DAY.  Marland Kitchen albuterol (PROVENTIL HFA;VENTOLIN HFA) 108 (90 Base) MCG/ACT inhaler Inhale 2 puffs into the lungs every 6 (six) hours as needed for wheezing or shortness of breath.  . escitalopram (LEXAPRO) 20 MG tablet TAKE (1) TABLET BY MOUTH ONCE DAILY.  Marland Kitchen estradiol (CLIMARA -  DOSED IN MG/24 HR) 0.025 mg/24hr patch Place 2 patches (0.05 mg total) onto the skin once a week.  . GENVOYA 150-150-200-10 MG TABS tablet TAKE ONE TABLET BY MOUTH ONCE DAILY.  Marland Kitchen ibuprofen (IBU) 800 MG tablet TAKE (1) TABLET BY MOUTH EVERY EIGHT HOURS AS NEEDED.  Marland Kitchen ketoconazole (NIZORAL) 2 % shampoo Apply 1 application topically 2 (two) times a week.  Marland Kitchen lisinopril-hydrochlorothiazide (PRINZIDE,ZESTORETIC) 10-12.5 MG tablet Take 1 tablet by mouth daily.  . montelukast (SINGULAIR) 10 MG tablet TAKE (1) TABLET BY MOUTH AT BEDTIME.  Marland Kitchen oxybutynin (DITROPAN-XL) 10 MG 24 hr tablet TAKE (1) TABLET BY MOUTH AT BEDTIME.  . traZODone (DESYREL) 100 MG tablet TAKE (1) TABLET BY MOUTH AT BEDTIME.   No facility-administered encounter medications on file as of 06/20/2017.     Allergies  Allergen Reactions  . Abilify [Aripiprazole] Other (See Comments)    Tardive dyskinesia  . Codeine Itching    Review of Systems  Constitutional: Negative for diaphoresis, fatigue and unexpected weight change.  HENT: Negative for congestion and dental problem.   Eyes: Negative for photophobia and visual disturbance.  Respiratory: Negative for cough, shortness of breath and wheezing.   Cardiovascular: Negative for chest pain, palpitations and leg swelling.  Gastrointestinal: Negative for abdominal distention, constipation and diarrhea.  Genitourinary: Negative for difficulty urinating and frequency.  Skin: Negative for rash.  Neurological: Negative for dizziness and headaches.  Psychiatric/Behavioral: The patient is nervous/anxious.        States has panic sometimes.  Refuses  counseling or referral  All other systems reviewed and are negative.   Physical Exam  Constitutional: She is oriented to person, place, and time. She appears well-developed and well-nourished. No distress.  HENT:  Head: Normocephalic and atraumatic.  Mouth/Throat: Oropharynx is clear and moist.  Eyes: Pupils are equal, round, and reactive to  light. Conjunctivae are normal.  Neck: Normal range of motion.  Cardiovascular: Normal rate, regular rhythm and normal heart sounds.  Pulmonary/Chest: Effort normal.  Abdominal: Soft. Bowel sounds are normal.  No tenderness.  No hepatomegaly.  Lymphadenopathy:    She has no cervical adenopathy.  Neurological: She is alert and oriented to person, place, and time.  Psychiatric:  Normal mood and affect, slightly hyperkinetic jittery behavior    BP 112/70   Pulse 78   Temp 98.6 F (37 C) (Temporal)   Resp 14   Ht 5' 4"  (1.626 m)   Wt 200 lb 1.3 oz (90.8 kg)   LMP 09/30/2014 (Approximate)   SpO2 94%   BMI 34.34 kg/m     ASSESSMENT/PLAN:  1. Hot flashes due to menopause Controlled with estrogen patch  2. Elevated liver function tests Stable.  Fatty liver.  Referred to gastroenterology  3. GERD without esophagitis Scheduled for EGD on May 1.  Compliant with medicine  4. Essential hypertension Well-controlled   Patient Instructions  You are doing well  No change in medicine  Need follow up every 6 months   Raylene Everts, MD

## 2017-06-21 ENCOUNTER — Ambulatory Visit: Payer: Self-pay

## 2017-06-30 ENCOUNTER — Other Ambulatory Visit: Payer: Self-pay

## 2017-06-30 ENCOUNTER — Encounter (HOSPITAL_COMMUNITY): Payer: Self-pay

## 2017-06-30 ENCOUNTER — Encounter (HOSPITAL_COMMUNITY)
Admission: RE | Disposition: A | Discharge status: Home / Self Care | Payer: Self-pay | Source: Ambulatory Visit | Attending: Internal Medicine

## 2017-06-30 ENCOUNTER — Ambulatory Visit (HOSPITAL_COMMUNITY)
Admission: RE | Admit: 2017-06-30 | Discharge: 2017-06-30 | Disposition: A | Discharge status: Home / Self Care | Payer: Medicaid Other | Source: Ambulatory Visit | Attending: Internal Medicine | Admitting: Internal Medicine

## 2017-06-30 DIAGNOSIS — A6000 Herpesviral infection of urogenital system, unspecified: Secondary | ICD-10-CM | POA: Diagnosis not present

## 2017-06-30 DIAGNOSIS — K210 Gastro-esophageal reflux disease with esophagitis: Secondary | ICD-10-CM | POA: Diagnosis not present

## 2017-06-30 DIAGNOSIS — J45909 Unspecified asthma, uncomplicated: Secondary | ICD-10-CM | POA: Diagnosis not present

## 2017-06-30 DIAGNOSIS — Z79899 Other long term (current) drug therapy: Secondary | ICD-10-CM | POA: Diagnosis not present

## 2017-06-30 DIAGNOSIS — K2960 Other gastritis without bleeding: Secondary | ICD-10-CM

## 2017-06-30 DIAGNOSIS — I10 Essential (primary) hypertension: Secondary | ICD-10-CM | POA: Insufficient documentation

## 2017-06-30 DIAGNOSIS — K219 Gastro-esophageal reflux disease without esophagitis: Secondary | ICD-10-CM | POA: Insufficient documentation

## 2017-06-30 DIAGNOSIS — Z888 Allergy status to other drugs, medicaments and biological substances status: Secondary | ICD-10-CM | POA: Insufficient documentation

## 2017-06-30 DIAGNOSIS — Z885 Allergy status to narcotic agent status: Secondary | ICD-10-CM | POA: Diagnosis not present

## 2017-06-30 DIAGNOSIS — Z21 Asymptomatic human immunodeficiency virus [HIV] infection status: Secondary | ICD-10-CM | POA: Insufficient documentation

## 2017-06-30 DIAGNOSIS — F419 Anxiety disorder, unspecified: Secondary | ICD-10-CM | POA: Insufficient documentation

## 2017-06-30 DIAGNOSIS — K3189 Other diseases of stomach and duodenum: Secondary | ICD-10-CM | POA: Insufficient documentation

## 2017-06-30 DIAGNOSIS — K228 Other specified diseases of esophagus: Secondary | ICD-10-CM | POA: Diagnosis not present

## 2017-06-30 DIAGNOSIS — G4730 Sleep apnea, unspecified: Secondary | ICD-10-CM | POA: Diagnosis not present

## 2017-06-30 HISTORY — PX: ESOPHAGOGASTRODUODENOSCOPY: SHX5428

## 2017-06-30 SURGERY — EGD (ESOPHAGOGASTRODUODENOSCOPY)
Anesthesia: Moderate Sedation

## 2017-06-30 MED ORDER — MIDAZOLAM HCL 5 MG/5ML IJ SOLN
INTRAMUSCULAR | Status: AC
  Filled 2017-06-30: qty 10

## 2017-06-30 MED ORDER — STERILE WATER FOR IRRIGATION IR SOLN
Status: DC | PRN
  Administered 2017-06-30: 2.5 mL

## 2017-06-30 MED ORDER — LIDOCAINE VISCOUS 2 % MT SOLN
OROMUCOSAL | Status: AC
  Filled 2017-06-30: qty 15

## 2017-06-30 MED ORDER — MEPERIDINE HCL 50 MG/ML IJ SOLN
INTRAMUSCULAR | Status: AC
  Filled 2017-06-30: qty 1

## 2017-06-30 MED ORDER — FAMOTIDINE 40 MG PO TABS: 40.0000 mg | ORAL_TABLET | Freq: Two times a day (BID) | ORAL | 5 refills | Status: DC

## 2017-06-30 MED ORDER — MEPERIDINE HCL 50 MG/ML IJ SOLN
INTRAMUSCULAR | Status: DC | PRN
  Administered 2017-06-30 (×2): 25 mg via INTRAVENOUS

## 2017-06-30 MED ORDER — SODIUM CHLORIDE 0.9 % IV SOLN
INTRAVENOUS | Status: DC
  Administered 2017-06-30: 10:00:00 via INTRAVENOUS

## 2017-06-30 MED ORDER — MIDAZOLAM HCL 5 MG/5ML IJ SOLN
INTRAMUSCULAR | Status: DC | PRN
  Administered 2017-06-30: 3 mg via INTRAVENOUS
  Administered 2017-06-30 (×2): 2 mg via INTRAVENOUS

## 2017-06-30 NOTE — Discharge Instructions (Signed)
Resume usual medications as before. Famotidine 40 mg by mouth 30 minutes before breakfast and evening meal daily.  Please let Dr. Meriel Pica office know that you are on this medication. I do not believe there should be significant interaction. Resume diet as recommended. No driving for 24 hours. Physician will call with results of blood test. Office visit in 4 weeks.   Upper Endoscopy, Care After Refer to this sheet in the next few weeks. These instructions provide you with information about caring for yourself after your procedure. Your health care provider may also give you more specific instructions. Your treatment has been planned according to current medical practices, but problems sometimes occur. Call your health care provider if you have any problems or questions after your procedure. What can I expect after the procedure? After the procedure, it is common to have:  A sore throat.  Bloating.  Nausea.  Follow these instructions at home:  Follow instructions from your health care provider about what to eat or drink after your procedure.  Return to your normal activities as told by your health care provider. Ask your health care provider what activities are safe for you.  Take over-the-counter and prescription medicines only as told by your health care provider.  Do not drive for 24 hours if you received a sedative.  Keep all follow-up visits as told by your health care provider. This is important. Contact a health care provider if:  You have a sore throat that lasts longer than one day.  You have trouble swallowing. Get help right away if:  You have a fever.  You vomit blood or your vomit looks like coffee grounds.  You have bloody, black, or tarry stools.  You have a severe sore throat or you cannot swallow.  You have difficulty breathing.  You have severe pain in your chest or belly. This information is not intended to replace advice given to you by your health  care provider. Make sure you discuss any questions you have with your health care provider. Document Released: 08/17/2011 Document Revised: 07/24/2015 Document Reviewed: 11/28/2014 Elsevier Interactive Patient Education  2018 [place].  Gastroesophageal Reflux Disease, Adult Normally, food travels down the esophagus and stays in the stomach to be digested. However, when a person has gastroesophageal reflux disease (GERD), food and stomach acid move back up into the esophagus. When this happens, the esophagus becomes sore and inflamed. Over time, GERD can create small holes (ulcers) in the lining of the esophagus. What are the causes? This condition is caused by a problem with the muscle between the esophagus and the stomach (lower esophageal sphincter, or LES). Normally, the LES muscle closes after food passes through the esophagus to the stomach. When the LES is weakened or abnormal, it does not close properly, and that allows food and stomach acid to go back up into the esophagus. The LES can be weakened by certain dietary substances, medicines, and medical conditions, including:  Tobacco use.  Pregnancy.  Having a hiatal hernia.  Heavy alcohol use.  Certain foods and beverages, such as coffee, chocolate, onions, and peppermint.  What increases the risk? This condition is more likely to develop in:  People who have an increased body weight.  People who have connective tissue disorders.  People who use NSAID medicines.  What are the signs or symptoms? Symptoms of this condition include:  Heartburn.  Difficult or painful swallowing.  The feeling of having a lump in the throat.  Abitter taste in the  mouth.  Bad breath.  Having a large amount of saliva.  Having an upset or bloated stomach.  Belching.  Chest pain.  Shortness of breath or wheezing.  Ongoing (chronic) cough or a night-time cough.  Wearing away of tooth enamel.  Weight loss.  Different  conditions can cause chest pain. Make sure to see your health care provider if you experience chest pain. How is this diagnosed? Your health care provider will take a medical history and perform a physical exam. To determine if you have mild or severe GERD, your health care provider may also monitor how you respond to treatment. You may also have other tests, including:  An endoscopy toexamine your stomach and esophagus with a small camera.  A test thatmeasures the acidity level in your esophagus.  A test thatmeasures how much pressure is on your esophagus.  A barium swallow or modified barium swallow to show the shape, size, and functioning of your esophagus.  How is this treated? The goal of treatment is to help relieve your symptoms and to prevent complications. Treatment for this condition may vary depending on how severe your symptoms are. Your health care provider may recommend:  Changes to your diet.  Medicine.  Surgery.  Follow these instructions at home: Diet  Follow a diet as recommended by your health care provider. This may involve avoiding foods and drinks such as: ? Coffee and tea (with or without caffeine). ? Drinks that containalcohol. ? Energy drinks and sports drinks. ? Carbonated drinks or sodas. ? Chocolate and cocoa. ? Peppermint and mint flavorings. ? Garlic and onions. ? Horseradish. ? Spicy and acidic foods, including peppers, chili powder, curry powder, vinegar, hot sauces, and barbecue sauce. ? Citrus fruit juices and citrus fruits, such as oranges, lemons, and limes. ? Tomato-based foods, such as red sauce, chili, salsa, and pizza with red sauce. ? Fried and fatty foods, such as donuts, french fries, potato chips, and high-fat dressings. ? High-fat meats, such as hot dogs and fatty cuts of red and [place] meats, such as rib eye steak, sausage, ham, and bacon. ? High-fat dairy items, such as whole milk, butter, and cream cheese.  Eat small, frequent  meals instead of large meals.  Avoid drinking large amounts of liquid with your meals.  Avoid eating meals during the 2-3 hours before bedtime.  Avoid lying down right after you eat.  Do not exercise right after you eat. General instructions  Pay attention to any changes in your symptoms.  Take over-the-counter and prescription medicines only as told by your health care provider. Do not take aspirin, ibuprofen, or other NSAIDs unless your health care provider told you to do so.  Do not use any tobacco products, including cigarettes, chewing tobacco, and e-cigarettes. If you need help quitting, ask your health care provider.  Wear loose-fitting clothing. Do not wear anything tight around your waist that causes pressure on your abdomen.  Raise (elevate) the head of your bed 6 inches (15cm).  Try to reduce your stress, such as with yoga or meditation. If you need help reducing stress, ask your health care provider.  If you are overweight, reduce your weight to an amount that is healthy for you. Ask your health care provider for guidance about a safe weight loss goal.  Keep all follow-up visits as told by your health care provider. This is important. Contact a health care provider if:  You have new symptoms.  You have unexplained weight loss.  You have difficulty  swallowing, or it hurts to swallow.  You have wheezing or a persistent cough.  Your symptoms do not improve with treatment.  You have a hoarse voice. Get help right away if:  You have pain in your arms, neck, jaw, teeth, or back.  You feel sweaty, dizzy, or light-headed.  You have chest pain or shortness of breath.  You vomit and your vomit looks like blood or coffee grounds.  You faint.  Your stool is bloody or black.  You cannot swallow, drink, or eat. This information is not intended to replace advice given to you by your health care provider. Make sure you discuss any questions you have with your health  care provider. Document Released: 11/25/2004 Document Revised: 07/16/2015 Document Reviewed: 06/12/2014 Elsevier Interactive Patient Education  [employer].

## 2017-06-30 NOTE — Op Note (Signed)
[facility] Patient Name: Chelsey Henson Procedure Date: 06/30/2017 11:22 AM MRN: [id number] Date of Birth: 05-31-1970 Attending MD: Hildred Laser , MD CSN: [id number] Age: 47 Admit Type: Outpatient Procedure:                Upper GI endoscopy Indications:              Follow-up of gastro-esophageal reflux disease Providers:                Hildred Laser, MD, Lurline Del, RN, Aram Candela Referring MD:             Lysle Morales, MD Medicines:                Lidocaine spray, Meperidine 50 mg IV, Midazolam 7                            mg IV Complications:            No immediate complications. Estimated Blood Loss:     Estimated blood loss: none. Procedure:                Pre-Anesthesia Assessment:                           - Prior to the procedure, a History and Physical                            was performed, and patient medications and                            allergies were reviewed. The patient's tolerance of                            previous anesthesia was also reviewed. The risks                            and benefits of the procedure and the sedation                            options and risks were discussed with the patient.                            All questions were answered, and informed consent                            was obtained. Prior Anticoagulants: The patient                            last took ibuprofen 1 day prior to the procedure.                            ASA Grade Assessment: III - A patient with severe                            systemic disease. After reviewing the risks and  benefits, the patient was deemed in satisfactory                            condition to undergo the procedure.                           After obtaining informed consent, the endoscope was                            passed under direct vision. Throughout the                            procedure, the patient's blood pressure, pulse, and                oxygen saturations were monitored continuously. The                            [id number] ([id number]) scope was introduced through the                            mouth, and advanced to the second part of duodenum.                            The upper GI endoscopy was accomplished without                            difficulty. The patient tolerated the procedure                            well. Scope In: 12:01:35 PM Scope Out: 12:06:26 PM Total Procedure Duration: 0 hours 4 minutes 51 seconds  Findings:      LA Grade A (one or more mucosal breaks less than 5 mm, not extending       between tops of 2 mucosal folds) esophagitis was found 34 to 35 cm from       the incisors.      The Z-line was irregular and was found 35 cm from the incisors.      The cardia, gastric fundus, gastric body and pylorus were normal.      Multiple erosions were found in the gastric antrum and in the prepyloric       region of the stomach.      The duodenal bulb and second portion of the duodenum were normal. Impression:               - LA Grade A reflux esophagitis( distal esophageal                            erosions).                           - Z-line irregular, 35 cm from the incisors.                           - Normal cardia, gastric fundus, gastric body and  pylorus.                           - Erosive gastropathy.                           - Normal duodenal bulb and second portion of the                            duodenum.                           - No specimens collected. Moderate Sedation:      Moderate (conscious) sedation was administered by the endoscopy nurse       and supervised by the endoscopist. The following parameters were       monitored: oxygen saturation, heart rate, blood pressure, CO2       capnography and response to care. Total physician intraservice time was       10 minutes. Recommendation:           - Patient has a contact number available  for                            emergencies. The signs and symptoms of potential                            delayed complications were discussed with the                            patient. Return to normal activities tomorrow.                            Written discharge instructions were provided to the                            patient.                           - Resume previous diet today.                           - Continue present medications.                           - Famotidine 40 mg po bid.                           - Patient given written instructions re diet.                           - Perform an H. pylori serology today.                           - Office visit in 4 weeks. Procedure Code(s):        --- Professional ---  09326, Esophagogastroduodenoscopy, flexible,                            transoral; diagnostic, including collection of                            specimen(s) by brushing or washing, when performed                            (separate procedure)                           G0500, Moderate sedation services provided by the                            same physician or other qualified health care                            professional performing a gastrointestinal                            endoscopic service that sedation supports,                            requiring the presence of an independent trained                            observer to assist in the monitoring of the                            patient's level of consciousness and physiological                            status; initial 15 minutes of intra-service time;                            patient age 63 years or older (additional time may                            be reported with [phone], as appropriate) Diagnosis Code(s):        --- Professional ---                           K21.0, Gastro-esophageal reflux disease with                            esophagitis                            K22.8, Other specified diseases of esophagus                           K31.89, Other diseases of stomach and duodenum CPT copyright 2017 American Medical Association. All rights reserved. The codes documented in this report are preliminary and upon coder review may  be revised to meet current compliance requirements.  Hildred Laser, MD Hildred Laser, MD 06/30/2017 12:25:09 PM This report has been signed electronically. Number of Addenda: 0

## 2017-06-30 NOTE — H&P (Signed)
Chelsey Henson is an 47 y.o. female.   Chief Complaint: Patient is here for EGD. HPI: Patient is a 47 year old Caucasian female who presents with over 10-year history of heartburn and regurgitation.  She has taken Prilosec in the past but it only helped for a few weeks.  She is getting some relief with ranitidine.  She denies dysphagia.  She has intermittent vomiting.  She generally vomits bile or clear fluid.  She denies hematemesis.  She denies epigastric pain or melena.  She takes ibuprofen occasionally. She has history of HIV.  Her CBC on 05/16/2017 was within normal limits.  Her CD4 count was also normal and HIV 1 RNA quantitative was undetectable.  She also has a history of mildly elevated transaminases felt to be secondary to fatty liver. Her test for hepatitis B and C were negative.  Past Medical History:  Diagnosis Date  . Anemia   . Anxiety   . Asthma   . Depression   . Genital herpes   . GERD (gastroesophageal reflux disease)   . HIV infection ([place]) 2008  . Hypertension   . Sleep apnea   . Substance abuse ([place])    RECOVERED 7 YEARS - COCAINE    Past Surgical History:  Procedure Laterality Date  . ABDOMINAL HYSTERECTOMY     BENIGN MASS  . HERNIA REPAIR  2017    Family History  Problem Relation Age of Onset  . Depression Mother   . Hyperlipidemia Mother   . Learning disabilities Mother   . Alcohol abuse Mother 16  . Alcohol abuse Father   . Arthritis Father   . Cancer Father   . Heart disease Father   . Hypertension Father   . Learning disabilities Father   . Alcohol abuse Sister        RECOVERED  . Alcohol abuse Brother   . Alcohol abuse Brother   . Alcohol abuse Brother    Social History:  reports that she has never smoked. She has never used smokeless tobacco. She reports that she drinks about 0.6 oz of alcohol per week. She reports that she does not use drugs.  Allergies:  Allergies  Allergen Reactions  . Abilify [Aripiprazole] Other (See Comments)   Tardive dyskinesia  . Codeine Itching    Medications Prior to Admission  Medication Sig Dispense Refill  . acyclovir (ZOVIRAX) 800 MG tablet TAKE 1 TABLET EVERY DAY. (Patient taking differently: Take 800 mg by mouth at bedtime) 90 tablet 3  . albuterol (PROVENTIL HFA;VENTOLIN HFA) 108 (90 Base) MCG/ACT inhaler Inhale 2 puffs into the lungs every 6 (six) hours as needed for wheezing or shortness of breath. 1 Inhaler 2  . escitalopram (LEXAPRO) 20 MG tablet TAKE (1) TABLET BY MOUTH ONCE DAILY. (Patient taking differently: Take 20 mg by mouth at bedtime) 90 tablet 1  . estradiol (CLIMARA - DOSED IN MG/24 HR) 0.025 mg/24hr patch Place 2 patches (0.05 mg total) onto the skin once a week. (Patient taking differently: Place 0.025 mg onto the skin every Saturday. ) 4 patch 1  . GENVOYA 150-150-200-10 MG TABS tablet TAKE ONE TABLET BY MOUTH ONCE DAILY. (Patient taking differently: Take 1 tablet by mouth at bedtime) 30 tablet 4  . ibuprofen (IBU) 800 MG tablet TAKE (1) TABLET BY MOUTH EVERY EIGHT HOURS AS NEEDED. (Patient taking differently: Take 800 mg by mouth every 8 (eight) hours as needed for headache or moderate pain. ) 50 tablet 3  . lisinopril-hydrochlorothiazide (PRINZIDE,ZESTORETIC) 10-12.5 MG tablet Take 1  tablet by mouth daily. 90 tablet 3  . Melatonin 1 MG CAPS Take 2 mg by mouth at bedtime.    . montelukast (SINGULAIR) 10 MG tablet TAKE (1) TABLET BY MOUTH AT BEDTIME. 90 tablet 0  . oxybutynin (DITROPAN-XL) 10 MG 24 hr tablet TAKE (1) TABLET BY MOUTH AT BEDTIME. 90 tablet 0  . traZODone (DESYREL) 100 MG tablet TAKE (1) TABLET BY MOUTH AT BEDTIME. 90 tablet 0  . ketoconazole (NIZORAL) 2 % shampoo Apply 1 application topically 2 (two) times a week. (Patient not taking: Reported on 06/28/2017) 120 mL 11    No results found for this or any previous visit (from the past 48 hour(s)). No results found.  ROS  Blood pressure (!) 98/56, pulse 69, temperature 98.3 F (36.8 C), temperature source  Oral, resp. rate 17, last menstrual period 09/30/2014, SpO2 99 %. Physical Exam  Constitutional: She appears well-developed and well-nourished.  HENT:  Mouth/Throat: Oropharynx is clear and moist.  Eyes: Conjunctivae are normal. No scleral icterus.  Neck: No thyromegaly present.  Cardiovascular: Normal rate and normal heart sounds.  No murmur heard. Respiratory: Effort normal and breath sounds normal.  GI:  Abdomen is obese but soft and nontender with organomegaly or masses.  Musculoskeletal: She exhibits no edema.  Lymphadenopathy:    She has no cervical adenopathy.  Neurological: She is alert.  Skin: Skin is warm and dry.     Assessment/Plan Chronic GERD unresponsive therapy. Patient is on trazodone and oxybutynin which could both affect gastric motility and worsen GERD. Diagnostic EGD.  Hildred Laser, MD 06/30/2017, 11:49 AM

## 2017-07-01 LAB — H. PYLORI ANTIBODY, IGG: H PYLORI IGG: 1.97 {index_val} — AB (ref 0.00–0.79)

## 2017-07-04 ENCOUNTER — Other Ambulatory Visit (INDEPENDENT_AMBULATORY_CARE_PROVIDER_SITE_OTHER): Payer: Self-pay | Admitting: Internal Medicine

## 2017-07-04 ENCOUNTER — Other Ambulatory Visit: Payer: Self-pay | Admitting: Family Medicine

## 2017-07-04 MED ORDER — BIS SUBCIT-METRONID-TETRACYC 140-125-125 MG PO CAPS: 3.0000 | ORAL_CAPSULE | Freq: Three times a day (TID) | ORAL | 0 refills | Status: DC

## 2017-07-06 ENCOUNTER — Encounter (HOSPITAL_COMMUNITY): Payer: Self-pay | Admitting: Internal Medicine

## 2017-07-07 ENCOUNTER — Telehealth (INDEPENDENT_AMBULATORY_CARE_PROVIDER_SITE_OTHER): Payer: Self-pay | Admitting: Internal Medicine

## 2017-07-07 NOTE — Telephone Encounter (Signed)
Patient states that she has been taking Pylera.  When she got the medication she took a evening dose  Yesterday and today she has taken as prescribed. It was not until taking this medication that she complained of a sore throat , coughing and muscle soreness, afebrile.  She is not sure that this medication has anything to do with it or not.  [phone]

## 2017-07-07 NOTE — Telephone Encounter (Signed)
Patient is complaining of a medication making her sluggish - please call ph# [phone]

## 2017-07-08 NOTE — Telephone Encounter (Signed)
Per Dr.Rehman - if the patient feels that the medication is the problem she may stop it. It is not felt that the medication is causing her symptoms. A message was left on the patient's voicemail with his recommendation , and we ask that she call our office if she did stop the medication.

## 2017-07-09 ENCOUNTER — Emergency Department (HOSPITAL_COMMUNITY): Payer: Medicaid Other

## 2017-07-09 ENCOUNTER — Other Ambulatory Visit: Payer: Self-pay

## 2017-07-09 ENCOUNTER — Encounter (HOSPITAL_COMMUNITY): Payer: Self-pay | Admitting: Emergency Medicine

## 2017-07-09 ENCOUNTER — Emergency Department (HOSPITAL_COMMUNITY)
Admission: EM | Admit: 2017-07-09 | Discharge: 2017-07-09 | Disposition: A | Discharge status: Home / Self Care | Payer: Medicaid Other | Attending: Emergency Medicine | Admitting: Emergency Medicine

## 2017-07-09 DIAGNOSIS — J4531 Mild persistent asthma with (acute) exacerbation: Secondary | ICD-10-CM

## 2017-07-09 DIAGNOSIS — I10 Essential (primary) hypertension: Secondary | ICD-10-CM | POA: Insufficient documentation

## 2017-07-09 DIAGNOSIS — R0602 Shortness of breath: Secondary | ICD-10-CM | POA: Diagnosis present

## 2017-07-09 DIAGNOSIS — J4521 Mild intermittent asthma with (acute) exacerbation: Secondary | ICD-10-CM | POA: Insufficient documentation

## 2017-07-09 DIAGNOSIS — Z79899 Other long term (current) drug therapy: Secondary | ICD-10-CM | POA: Diagnosis not present

## 2017-07-09 DIAGNOSIS — B20 Human immunodeficiency virus [HIV] disease: Secondary | ICD-10-CM | POA: Diagnosis not present

## 2017-07-09 MED ORDER — BENZONATATE 100 MG PO CAPS: 100.0000 mg | ORAL_CAPSULE | Freq: Three times a day (TID) | ORAL | 0 refills | Status: DC

## 2017-07-09 MED ORDER — PREDNISONE 20 MG PO TABS
40.0000 mg | ORAL_TABLET | Freq: Once | ORAL | Status: AC
  Administered 2017-07-09: 40 mg via ORAL
  Filled 2017-07-09: qty 2

## 2017-07-09 MED ORDER — IPRATROPIUM-ALBUTEROL 0.5-2.5 (3) MG/3ML IN SOLN
3.0000 mL | Freq: Once | RESPIRATORY_TRACT | Status: AC
  Administered 2017-07-09: 3 mL via RESPIRATORY_TRACT
  Filled 2017-07-09: qty 3

## 2017-07-09 MED ORDER — ALBUTEROL SULFATE HFA 108 (90 BASE) MCG/ACT IN AERS: 2.0000 | INHALATION_SPRAY | RESPIRATORY_TRACT | 3 refills | Status: DC | PRN

## 2017-07-09 MED ORDER — PREDNISONE 20 MG PO TABS: 40.0000 mg | ORAL_TABLET | Freq: Every day | ORAL | 0 refills | Status: DC

## 2017-07-09 NOTE — ED Triage Notes (Addendum)
Pt c/o productive cough and worsening SOB x 2 weeks. Also c/o chest soreness due to coughing. Denies fevers. Pt hx of asthma. Pt used albuterol inhaler with mild relief. Dyspnea at rest.

## 2017-07-09 NOTE — ED Triage Notes (Signed)
Pt reports "smoking Crack for 20 years" Clean for the last 10 years

## 2017-07-09 NOTE — Discharge Instructions (Signed)
Your testing today has shown a normal-appearing chest x-ray with no signs of infection or pneumonia.  Please take the albuterol every 4 hours as needed, please take prednisone daily for 5 days.  You should return to the emergency department for increasing symptoms including pain, difficulty breathing, fevers.  Please obtain all of your results from medical records or have your doctors office obtain the results - share them with your doctor - you should be seen at your doctors office in the next 2 days. Call today to arrange your follow up. Take the medications as prescribed. Please review all of the medicines and only take them if you do not have an allergy to them. Please be aware that if you are taking birth control pills, taking other prescriptions, ESPECIALLY ANTIBIOTICS may make the birth control ineffective - if this is the case, either do not engage in sexual activity or use alternative methods of birth control such as condoms until you have finished the medicine and your family doctor says it is OK to restart them. If you are on a blood thinner such as COUMADIN, be aware that any other medicine that you take may cause the coumadin to either work too much, or not enough - you should have your coumadin level rechecked in next 7 days if this is the case.  ?  It is also a possibility that you have an allergic reaction to any of the medicines that you have been prescribed - Everybody reacts differently to medications and while MOST people have no trouble with most medicines, you may have a reaction such as nausea, vomiting, rash, swelling, shortness of breath. If this is the case, please stop taking the medicine immediately and contact your physician.  ?  You should return to the ER if you develop severe or worsening symptoms.

## 2017-07-09 NOTE — ED Provider Notes (Signed)
[facility] EMERGENCY DEPARTMENT Provider Note   CSN: [id number] Arrival date & time: 07/09/17  1341     History   Chief Complaint Chief Complaint  Patient presents with  . Shortness of Breath    HPI Chelsey Henson is a 47 y.o. female.  HPI  The patient is a 47 year old female who has been HIV positive for approximately 25 years, she has followed up with her infectious disease clinic with a last CD4 count of 820, viral load was undetectable, she did have some worsening transaminitis in the past, negative hepatitis B and C.  The patient was last seen in the clinic on May 24, 2017.  She is on Genvoya.  This information was obtained from the electronic medical record evaluation  The patient reports that she has had approximately 2 weeks of a persistent cough, shortness of breath and a tightness in her chest.  She feels like this is similar to her asthma, she has been coughing frequently but denies any swelling of her legs, denies any fevers chills nausea vomiting or diarrhea.  She has been using her albuterol inhaler with some temporary relief but it does not last.  She is frustrated that she has moved here within the last year and a half and since then has had multiple episodes of reactive airway disease brought on by viral illnesses as well as strep throat most recently.  At this time the patient denies any sore throat runny nose headache blurred vision or rashes to the skin.  Past Medical History:  Diagnosis Date  . Anemia   . Anxiety   . Asthma   . Depression   . Genital herpes   . GERD (gastroesophageal reflux disease)   . HIV infection ([place]) 2008  . Hypertension   . Sleep apnea   . Substance abuse ([place])    RECOVERED 7 YEARS - COCAINE    Patient Active Problem List   Diagnosis Date Noted  . Transaminitis 05/24/2017  . Vaccine counseling 05/24/2017  . Gastroesophageal reflux disease without esophagitis 04/06/2017  . MDD (major depressive disorder), recurrent episode,  moderate ([place]) 09/08/2016  . Cocaine use disorder, moderate, in sustained remission ([place]) 09/08/2016  . Abdominal pain 07/19/2016  . OSA (obstructive sleep apnea) 04/15/2016  . Asthma 04/15/2016  . HIV (human immunodeficiency virus infection) ([place]) 04/05/2016  . Essential hypertension 04/05/2016  . Chronic insomnia 04/05/2016    Past Surgical History:  Procedure Laterality Date  . ABDOMINAL HYSTERECTOMY     BENIGN MASS  . ESOPHAGOGASTRODUODENOSCOPY N/A 06/30/2017   Procedure: ESOPHAGOGASTRODUODENOSCOPY (EGD);  Surgeon: Rogene Houston, MD;  Location[facility];  Service: Endoscopy;  Laterality: N/A;  2:00  . HERNIA REPAIR  2017     OB History   None      Home Medications    Prior to Admission medications   Medication Sig Start Date End Date Taking? Authorizing Provider  acyclovir (ZOVIRAX) 800 MG tablet TAKE 1 TABLET EVERY DAY. Patient taking differently: Take 800 mg by mouth at bedtime 11/02/16   Raylene Everts, MD  albuterol (PROVENTIL HFA;VENTOLIN HFA) 108 (90 Base) MCG/ACT inhaler Inhale 2 puffs into the lungs every 4 (four) hours as needed for wheezing or shortness of breath. 07/09/17   Noemi Chapel, MD  benzonatate (TESSALON) 100 MG capsule Take 1 capsule (100 mg total) by mouth every 8 (eight) hours. 07/09/17   Noemi Chapel, MD  bismuth-metronidazole-tetracycline [facility]) [phone] MG capsule Take 3 capsules by mouth 4 (four) times daily -  before meals and at bedtime. 07/04/17   Rehman, Mechele Dawley, MD  escitalopram (LEXAPRO) 20 MG tablet TAKE (1) TABLET BY MOUTH ONCE DAILY. Patient taking differently: Take 20 mg by mouth at bedtime 02/03/17   Raylene Everts, MD  estradiol (CLIMARA - DOSED IN MG/24 HR) 0.025 mg/24hr patch Place 2 patches (0.05 mg total) onto the skin once a week. Patient taking differently: Place 0.025 mg onto the skin every Saturday.  05/18/17   Raylene Everts, MD  famotidine (PEPCID) 40 MG tablet Take 1 tablet (40 mg total) by mouth 2 (two)  times daily. 06/30/17   Rehman, Mechele Dawley, MD  GENVOYA 150-150-200-10 MG TABS tablet TAKE ONE TABLET BY MOUTH ONCE DAILY. Patient taking differently: Take 1 tablet by mouth at bedtime 06/15/17   Thayer Headings, MD  ibuprofen (IBU) 800 MG tablet TAKE (1) TABLET BY MOUTH EVERY EIGHT HOURS AS NEEDED. Patient taking differently: Take 800 mg by mouth every 8 (eight) hours as needed for headache or moderate pain.  03/07/17   Raylene Everts, MD  lisinopril-hydrochlorothiazide (PRINZIDE,ZESTORETIC) 10-12.5 MG tablet Take 1 tablet by mouth daily. 11/16/16   Raylene Everts, MD  Melatonin 1 MG CAPS Take 2 mg by mouth at bedtime.    [provider]  montelukast (SINGULAIR) 10 MG tablet TAKE (1) TABLET BY MOUTH AT BEDTIME. 04/20/17   Raylene Everts, MD  oxybutynin (DITROPAN-XL) 10 MG 24 hr tablet TAKE (1) TABLET BY MOUTH AT BEDTIME. 06/20/17   Raylene Everts, MD  predniSONE (DELTASONE) 20 MG tablet Take 2 tablets (40 mg total) by mouth daily. 07/09/17   Noemi Chapel, MD  traZODone (DESYREL) 100 MG tablet TAKE (1) TABLET BY MOUTH AT BEDTIME. 06/20/17   Raylene Everts, MD    Family History Family History  Problem Relation Age of Onset  . Depression Mother   . Hyperlipidemia Mother   . Learning disabilities Mother   . Alcohol abuse Mother 51  . Alcohol abuse Father   . Arthritis Father   . Cancer Father   . Heart disease Father   . Hypertension Father   . Learning disabilities Father   . Alcohol abuse Sister        RECOVERED  . Alcohol abuse Brother   . Alcohol abuse Brother   . Alcohol abuse Brother     Social History Social History   Tobacco Use  . Smoking status: Never Smoker  . Smokeless tobacco: Never Used  Substance Use Topics  . Alcohol use: Yes    Alcohol/week: 0.6 oz    Types: 1 Cans of beer per week    Comment: occ.  . Drug use: No    Comment: none in 7 years     Allergies   Abilify [aripiprazole] and Codeine   Review of Systems Review of Systems    All other systems reviewed and are negative.    Physical Exam Updated Vital Signs BP (!) 118/52 (BP Location: Right Arm)   Pulse 91   Temp 98.2 F (36.8 C) (Oral)   Resp 20   Ht 5' 4"  (1.626 m)   Wt 90.7 kg (200 lb)   LMP 09/30/2014 (Approximate)   SpO2 96%   BMI 34.33 kg/m   Physical Exam  Constitutional: She appears well-developed and well-nourished. No distress.  HENT:  Head: Normocephalic and atraumatic.  Mouth/Throat: Oropharynx is clear and moist. No oropharyngeal exudate.  Eyes: Pupils are equal, round, and reactive to light. Conjunctivae and EOM are  normal. Right eye exhibits no discharge. Left eye exhibits no discharge. No scleral icterus.  Neck: Normal range of motion. Neck supple. No JVD present. No thyromegaly present.  Cardiovascular: Normal rate, regular rhythm, normal heart sounds and intact distal pulses. Exam reveals no gallop and no friction rub.  No murmur heard. Pulmonary/Chest: Effort normal. No respiratory distress. She has wheezes. She has no rales.  The patient is able to speak in full sentences, she has unlabored breathing, she does have expiratory wheezing in all lung fields with a mild prolonged expiratory phase.  There is no accessory muscle use.  Abdominal: Soft. Bowel sounds are normal. She exhibits no distension and no mass. There is no tenderness.  Musculoskeletal: Normal range of motion. She exhibits no edema or tenderness.  Lymphadenopathy:    She has no cervical adenopathy.  Neurological: She is alert. Coordination normal.  Skin: Skin is warm and dry. No rash noted. No erythema.  Psychiatric: She has a normal mood and affect. Her behavior is normal.  Nursing note and vitals reviewed.    ED Treatments / Results  Labs (all labs ordered are listed, but only abnormal results are displayed) Labs Reviewed - No data to display  EKG None  Radiology Dg Chest 2 View  Result Date: 07/09/2017 CLINICAL DATA:  Productive cough for the past 2  weeks EXAM: CHEST - 2 VIEW COMPARISON:  07/13/2016 FINDINGS: Grossly unchanged cardiac silhouette and mediastinal contours. No focal airspace opacities. No pleural effusion or pneumothorax. No evidence of edema. No acute osseus abnormalities. IMPRESSION: No acute cardiopulmonary disease. Electronically Signed   By: Sandi Mariscal M.D.   On: 07/09/2017 14:13    Procedures Procedures (including critical care time)  Medications Ordered in ED Medications  predniSONE (DELTASONE) tablet 40 mg (40 mg Oral Given 07/09/17 1429)  ipratropium-albuterol (DUONEB) 0.5-2.5 (3) MG/3ML nebulizer solution 3 mL (3 mLs Nebulization Given 07/09/17 1443)     Initial Impression / Assessment and Plan / ED Course  I have reviewed the triage vital signs and the nursing notes.  Pertinent labs & imaging results that were available during my care of the patient were reviewed by me and considered in my medical decision making (see chart for details).    The patient is not in distress, her oxygenation is normal, work of breathing is normal, she does have wheezing consistent with reactive airway disease.  Due to 2 weeks of coughing will obtain a chest x-ray to rule out pneumonia though at this time the last known CD4 count was 820 based on labs from 05/16/17.    Pt agreeable - may need prednisone course  May need abx if xray abnormal.  The patient's chest x-ray is totally normal, no signs of infiltrate or pneumothorax, she was updated, she will be given prednisone, a DuoNeb and discharged home with prednisone and a cough medication.  No signs of infection.  Patient stable for discharge and expressed her understanding to the indications for return.  Final Clinical Impressions(s) / ED Diagnoses   Final diagnoses:  Mild persistent asthma with exacerbation    ED Discharge Orders        Ordered    predniSONE (DELTASONE) 20 MG tablet  Daily     07/09/17 1446    albuterol (PROVENTIL HFA;VENTOLIN HFA) 108 (90 Base) MCG/ACT  inhaler  Every 4 hours PRN     07/09/17 1446    benzonatate (TESSALON) 100 MG capsule  Every 8 hours     07/09/17 1446  Noemi Chapel, MD 07/09/17 [phone]

## 2017-07-11 ENCOUNTER — Encounter (HOSPITAL_COMMUNITY): Payer: Self-pay | Admitting: Emergency Medicine

## 2017-07-11 ENCOUNTER — Other Ambulatory Visit: Payer: Self-pay

## 2017-07-11 DIAGNOSIS — I10 Essential (primary) hypertension: Secondary | ICD-10-CM | POA: Diagnosis not present

## 2017-07-11 DIAGNOSIS — J40 Bronchitis, not specified as acute or chronic: Secondary | ICD-10-CM | POA: Diagnosis not present

## 2017-07-11 DIAGNOSIS — J9801 Acute bronchospasm: Secondary | ICD-10-CM | POA: Diagnosis not present

## 2017-07-11 DIAGNOSIS — B20 Human immunodeficiency virus [HIV] disease: Secondary | ICD-10-CM | POA: Diagnosis not present

## 2017-07-11 DIAGNOSIS — Z79899 Other long term (current) drug therapy: Secondary | ICD-10-CM | POA: Insufficient documentation

## 2017-07-11 DIAGNOSIS — R05 Cough: Secondary | ICD-10-CM | POA: Diagnosis present

## 2017-07-11 NOTE — ED Triage Notes (Signed)
Pt was seen Saturday for bronchitis and was given steroid and cough suppressant. Denies improvement and feels like congestion is moving into her face. Pt is HIV positive. Also complains that she ran out of her estrogen patches and needs a refill.

## 2017-07-12 ENCOUNTER — Emergency Department (HOSPITAL_COMMUNITY)
Admission: EM | Admit: 2017-07-12 | Discharge: 2017-07-12 | Disposition: A | Discharge status: Home / Self Care | Payer: Medicaid Other | Attending: Emergency Medicine | Admitting: Emergency Medicine

## 2017-07-12 DIAGNOSIS — J209 Acute bronchitis, unspecified: Secondary | ICD-10-CM

## 2017-07-12 MED ORDER — ALBUTEROL (5 MG/ML) CONTINUOUS INHALATION SOLN
10.0000 mg/h | INHALATION_SOLUTION | Freq: Once | RESPIRATORY_TRACT | Status: AC
  Administered 2017-07-12: 10 mg/h via RESPIRATORY_TRACT
  Filled 2017-07-12: qty 20

## 2017-07-12 MED ORDER — DOXYCYCLINE HYCLATE 100 MG PO CAPS: 100.0000 mg | ORAL_CAPSULE | Freq: Two times a day (BID) | ORAL | 0 refills | Status: DC

## 2017-07-12 NOTE — ED Provider Notes (Signed)
[facility] EMERGENCY DEPARTMENT Provider Note   CSN: [id number] Arrival date & time: 07/11/17  2252  Time seen 01:35 AM   History   Chief Complaint Chief Complaint  Patient presents with  . Cough    HPI Chelsey Henson is a 47 y.o. female.  HPI patient states on May 2 she had endoscopy done by Dr.Rehman and was diagnosed with a H. pylori infection.  She was placed on antibiotics but states she had to stop because they were causing nausea.  She states a few days later she started having a cough with mucus that she describes as Stash and light green, sneezing, sometimes a sore throat, her nose is burning, and now her cheeks feel full like there popping.  She denies fever, nausea, vomiting, diarrhea.  She is unsure if fever.  She also states she feels like she is wheezing.  She states she has had to use inhalers in the past although she has never had a nebulizer at home.  She was seen in the ED on May 11 and was diagnosed with bronchitis and was treated with prednisone and Tessalon Perles.  She returns stating she is not any better.  Patient also states she is between doctors and she ran out of her estrogen patch a week ago, she states she is having hot flashes.  PCP [name], MD went to his office today however her Medicaid card had not been turned over to him so she was not seen.  Past Medical History:  Diagnosis Date  . Anemia   . Anxiety   . Asthma   . Depression   . Genital herpes   . GERD (gastroesophageal reflux disease)   . HIV infection ([place]) 2008  . Hypertension   . Sleep apnea   . Substance abuse ([place])    RECOVERED 7 YEARS - COCAINE    Patient Active Problem List   Diagnosis Date Noted  . Transaminitis 05/24/2017  . Vaccine counseling 05/24/2017  . Gastroesophageal reflux disease without esophagitis 04/06/2017  . MDD (major depressive disorder), recurrent episode, moderate ([place]) 09/08/2016  . Cocaine use disorder, moderate, in sustained remission ([place])  09/08/2016  . Abdominal pain 07/19/2016  . OSA (obstructive sleep apnea) 04/15/2016  . Asthma 04/15/2016  . HIV (human immunodeficiency virus infection) ([place]) 04/05/2016  . Essential hypertension 04/05/2016  . Chronic insomnia 04/05/2016    Past Surgical History:  Procedure Laterality Date  . ABDOMINAL HYSTERECTOMY     BENIGN MASS  . ESOPHAGOGASTRODUODENOSCOPY N/A 06/30/2017   Procedure: ESOPHAGOGASTRODUODENOSCOPY (EGD);  Surgeon: Rogene Houston, MD;  Location: [facility];  Service: Endoscopy;  Laterality: N/A;  2:00  . HERNIA REPAIR  2017     OB History   None      Home Medications    Prior to Admission medications   Medication Sig Start Date End Date Taking? Authorizing Provider  acyclovir (ZOVIRAX) 800 MG tablet TAKE 1 TABLET EVERY DAY. Patient taking differently: Take 800 mg by mouth at bedtime 11/02/16   Raylene Everts, MD  albuterol (PROVENTIL HFA;VENTOLIN HFA) 108 (90 Base) MCG/ACT inhaler Inhale 2 puffs into the lungs every 4 (four) hours as needed for wheezing or shortness of breath. 07/09/17   Noemi Chapel, MD  benzonatate (TESSALON) 100 MG capsule Take 1 capsule (100 mg total) by mouth every 8 (eight) hours. 07/09/17   Noemi Chapel, MD  bismuth-metronidazole-tetracycline [facility]) [phone] MG capsule Take 3 capsules by mouth 4 (four) times daily -  before meals  and at bedtime. 07/04/17   Rehman, Mechele Dawley, MD  doxycycline (VIBRAMYCIN) 100 MG capsule Take 1 capsule (100 mg total) by mouth 2 (two) times daily. 07/12/17   Rolland Porter, MD  escitalopram (LEXAPRO) 20 MG tablet TAKE (1) TABLET BY MOUTH ONCE DAILY. Patient taking differently: Take 20 mg by mouth at bedtime 02/03/17   Raylene Everts, MD  estradiol (CLIMARA - DOSED IN MG/24 HR) 0.025 mg/24hr patch Place 2 patches (0.05 mg total) onto the skin once a week. Patient taking differently: Place 0.025 mg onto the skin every Saturday.  05/18/17   Raylene Everts, MD  famotidine (PEPCID) 40 MG tablet Take 1  tablet (40 mg total) by mouth 2 (two) times daily. 06/30/17   Rehman, Mechele Dawley, MD  GENVOYA 150-150-200-10 MG TABS tablet TAKE ONE TABLET BY MOUTH ONCE DAILY. Patient taking differently: Take 1 tablet by mouth at bedtime 06/15/17   Thayer Headings, MD  ibuprofen (IBU) 800 MG tablet TAKE (1) TABLET BY MOUTH EVERY EIGHT HOURS AS NEEDED. Patient taking differently: Take 800 mg by mouth every 8 (eight) hours as needed for headache or moderate pain.  03/07/17   Raylene Everts, MD  lisinopril-hydrochlorothiazide (PRINZIDE,ZESTORETIC) 10-12.5 MG tablet Take 1 tablet by mouth daily. 11/16/16   Raylene Everts, MD  Melatonin 1 MG CAPS Take 2 mg by mouth at bedtime.    [provider]  montelukast (SINGULAIR) 10 MG tablet TAKE (1) TABLET BY MOUTH AT BEDTIME. 04/20/17   Raylene Everts, MD  oxybutynin (DITROPAN-XL) 10 MG 24 hr tablet TAKE (1) TABLET BY MOUTH AT BEDTIME. 06/20/17   Raylene Everts, MD  predniSONE (DELTASONE) 20 MG tablet Take 2 tablets (40 mg total) by mouth daily. 07/09/17   Noemi Chapel, MD  traZODone (DESYREL) 100 MG tablet TAKE (1) TABLET BY MOUTH AT BEDTIME. 06/20/17   Raylene Everts, MD    Family History Family History  Problem Relation Age of Onset  . Depression Mother   . Hyperlipidemia Mother   . Learning disabilities Mother   . Alcohol abuse Mother 39  . Alcohol abuse Father   . Arthritis Father   . Cancer Father   . Heart disease Father   . Hypertension Father   . Learning disabilities Father   . Alcohol abuse Sister        RECOVERED  . Alcohol abuse Brother   . Alcohol abuse Brother   . Alcohol abuse Brother     Social History Social History   Tobacco Use  . Smoking status: Never Smoker  . Smokeless tobacco: Never Used  Substance Use Topics  . Alcohol use: Yes    Alcohol/week: 0.6 oz    Types: 1 Cans of beer per week    Comment: occ.  . Drug use: No    Comment: none in 7 years  sober from cocaine x 10 years   Allergies   Abilify  [aripiprazole] and Codeine   Review of Systems Review of Systems  All other systems reviewed and are negative.    Physical Exam Updated Vital Signs BP (!) 147/91 (BP Location: Right Arm)   Pulse 77   Temp 98.1 F (36.7 C) (Oral)   Resp 18   Ht 5' 4"  (1.626 m)   Wt 90.7 kg (200 lb)   LMP 09/30/2014 (Approximate)   SpO2 100%   BMI 34.33 kg/m   Vital signs normal    Physical Exam  Constitutional: She is oriented to person, place,  and time. She appears well-developed and well-nourished.  Non-toxic appearance. She does not appear ill. No distress.  HENT:  Head: Normocephalic and atraumatic.  Right Ear: External ear normal.  Left Ear: External ear normal.  Nose: Nose normal. No mucosal edema or rhinorrhea.  Mouth/Throat: Oropharynx is clear and moist and mucous membranes are normal. No dental abscesses or uvula swelling.  Eyes: Pupils are equal, round, and reactive to light. Conjunctivae and EOM are normal.  Neck: Normal range of motion and full passive range of motion without pain. Neck supple.  Cardiovascular: Normal rate, regular rhythm and normal heart sounds. Exam reveals no gallop and no friction rub.  No murmur heard. Pulmonary/Chest: Effort normal. No respiratory distress. She has decreased breath sounds. She has no wheezes. She has rhonchi. She has no rales. She exhibits no tenderness and no crepitus.  Coughing frequently  Abdominal: Soft. Normal appearance and bowel sounds are normal. She exhibits no distension. There is no tenderness. There is no rebound and no guarding.  Musculoskeletal: Normal range of motion. She exhibits no edema or tenderness.  Moves all extremities well.   Neurological: She is alert and oriented to person, place, and time. She has normal strength. No cranial nerve deficit.  Skin: Skin is warm, dry and intact. No rash noted. No erythema. No pallor.  Psychiatric: She has a normal mood and affect. Her speech is normal and behavior is normal. Her  mood appears not anxious.  Nursing note and vitals reviewed.    ED Treatments / Results  Labs (all labs ordered are listed, but only abnormal results are displayed) Labs Reviewed - No data to display  EKG None  Radiology Dg Chest 2 View  Result Date: 07/09/2017 CLINICAL DATA:  Productive cough for the past 2 weeks  IMPRESSION: No acute cardiopulmonary disease. Electronically Signed   By: Sandi Mariscal M.D.   On: 07/09/2017 14:13    Procedures Procedures (including critical care time)  Medications Ordered in ED Medications  albuterol (PROVENTIL,VENTOLIN) solution continuous neb (10 mg/hr Nebulization Given 07/12/17 0212)     Initial Impression / Assessment and Plan / ED Course  I have reviewed the triage vital signs and the nursing notes.  Pertinent labs & imaging results that were available during my care of the patient were reviewed by me and considered in my medical decision making (see chart for details).    Patient was given a continuous nebulizer of albuterol 10 mg.  Recheck at 4:26 AM patient states she feels much better.  When I listen to her she has much improved air movement now she has diffuse expiratory wheezing.  Patient now tells me she does have a nebulizer at home although she had told me before she did not.  She also states she has a little plastic pledgets of albuterol medication to take.  I was going to give her another nebulizer treatment in the ED however she states she would prefer to do it at home.   Final Clinical Impressions(s) / ED Diagnoses   Final diagnoses:  Bronchitis with bronchospasm    ED Discharge Orders        Ordered    doxycycline (VIBRAMYCIN) 100 MG capsule  2 times daily     07/12/17 0440     Plan discharge  Rolland Porter, MD, Barbette Or, MD 07/12/17 [phone]

## 2017-07-12 NOTE — Discharge Instructions (Addendum)
Use your nebulizer with albuterol for your wheezing and shortness of breath.  Take the antibiotic until gone.  Finish your steroids.  Recheck if you get a high fever or you feel like you are getting worse.

## 2017-07-18 ENCOUNTER — Other Ambulatory Visit: Payer: Self-pay | Admitting: Family Medicine

## 2017-07-20 ENCOUNTER — Telehealth: Payer: Self-pay

## 2017-07-20 ENCOUNTER — Other Ambulatory Visit: Payer: Self-pay | Admitting: Family Medicine

## 2017-07-20 NOTE — Telephone Encounter (Signed)
Chelsey Henson called today stating that she has an ongoing upper respiratory issue that has been going on for a couple of weeks. Chelsey Henson stated that she has finished her antibiotics and steroids that the ED gave her to treat her URI. Chelsey Henson stated that she still does not feel good and has a persistent cough that is getting worse. Chelsey Henson was given some cough medicine but feels like it is not helping. Chelsey Henson is in the process of switching PCPs and does not have an appt yet. Chelsey Henson states that Dr. Linus Salmons is her only MD she can talk to while she establishes care with a new PCP. Chelsey Henson is scheduled to see Dr. Linus Salmons 07/26/17. Informed Chelsey Henson if she feels worse to go to urgent care or Ed to immediate  assistance.  [place]

## 2017-07-26 ENCOUNTER — Encounter: Payer: Self-pay | Admitting: Internal Medicine

## 2017-07-26 ENCOUNTER — Ambulatory Visit (INDEPENDENT_AMBULATORY_CARE_PROVIDER_SITE_OTHER): Payer: Medicaid Other | Admitting: Internal Medicine

## 2017-07-26 DIAGNOSIS — J45909 Unspecified asthma, uncomplicated: Secondary | ICD-10-CM | POA: Insufficient documentation

## 2017-07-26 DIAGNOSIS — J209 Acute bronchitis, unspecified: Secondary | ICD-10-CM | POA: Diagnosis not present

## 2017-07-26 MED ORDER — ESTRADIOL 0.025 MG/24HR TD PTWK: 0.0250 mg | MEDICATED_PATCH | TRANSDERMAL | 1 refills | Status: DC

## 2017-07-26 MED ORDER — PREDNISONE 20 MG PO TABS: 40.0000 mg | ORAL_TABLET | Freq: Every day | ORAL | 0 refills | Status: DC

## 2017-07-26 MED ORDER — ALBUTEROL SULFATE (2.5 MG/3ML) 0.083% IN NEBU: 2.5000 mg | INHALATION_SOLUTION | Freq: Four times a day (QID) | RESPIRATORY_TRACT | 5 refills | Status: DC | PRN

## 2017-07-26 MED ORDER — MONTELUKAST SODIUM 10 MG PO TABS: 10.0000 mg | ORAL_TABLET | Freq: Every day | ORAL | 3 refills | Status: DC

## 2017-07-26 NOTE — Patient Instructions (Signed)
If you get worse, go to the ED

## 2017-07-26 NOTE — Progress Notes (Signed)
   Subjective:    Patient ID: Chelsey Henson, female    DOB: [birth year], 47 y.o.   MRN: [id number]  HPI She is here for a work in visit.  She has well-controlled HIV but does not currentlyhave a PCP.  She was recently in the ED and diagnosed with bronchitis with bronchospasm and given doxycycline, albuterol nebulizer but she does not know how to use it and does not have nebulizer solution.  Some sob with activity but able to walk distances.  No fever, + cough.  Out of Singulair.     Review of Systems  Respiratory: Positive for cough, chest tightness and shortness of breath. Negative for wheezing.   Gastrointestinal: Negative for diarrhea.  Skin: Negative for rash.       Objective:   Physical Exam  Constitutional: She appears well-developed and well-nourished. No distress.  Cardiovascular: Normal rate, regular rhythm and normal heart sounds.  Pulmonary/Chest: Effort normal. No respiratory distress. She has no wheezes.  She has decreased air entry with no wheezes noted but short breaths.  Speaking in full sentences and no acute respiratory distress.    Lymphadenopathy:    She has no cervical adenopathy.    SH: no tobacco      Assessment & Plan:

## 2017-07-26 NOTE — Assessment & Plan Note (Signed)
I refilled Singulair

## 2017-07-26 NOTE — Assessment & Plan Note (Signed)
On exam she has poor air movement but is not in any distress requiring urgent intervention.  I will give her more predisone, she was instructed in how to use the nebulizer and given albuterol for nebulizer.  I advised her to go to the ED if she worsens in any way or call 911.

## 2017-07-28 ENCOUNTER — Ambulatory Visit (INDEPENDENT_AMBULATORY_CARE_PROVIDER_SITE_OTHER): Payer: Self-pay | Admitting: Internal Medicine

## 2017-07-28 ENCOUNTER — Encounter (INDEPENDENT_AMBULATORY_CARE_PROVIDER_SITE_OTHER): Payer: Self-pay | Admitting: Internal Medicine

## 2017-08-18 ENCOUNTER — Telehealth: Payer: Self-pay

## 2017-08-18 ENCOUNTER — Other Ambulatory Visit: Payer: Self-pay | Admitting: Family Medicine

## 2017-08-18 NOTE — Telephone Encounter (Signed)
Pharmacy called today stating they have been having issues with Dr. Henreitta Leber medicaid number and were unable to do refills for pt unless they received orders from other MD or if the issue with his number is corrected. Was able to give a verbal order to pharmacy for prescription to be refilled. [place]

## 2017-08-24 ENCOUNTER — Other Ambulatory Visit: Payer: Self-pay | Admitting: Infectious Diseases

## 2017-09-06 DIAGNOSIS — J45909 Unspecified asthma, uncomplicated: Secondary | ICD-10-CM | POA: Diagnosis not present

## 2017-09-06 DIAGNOSIS — I10 Essential (primary) hypertension: Secondary | ICD-10-CM | POA: Diagnosis not present

## 2017-09-06 DIAGNOSIS — F5101 Primary insomnia: Secondary | ICD-10-CM | POA: Diagnosis not present

## 2017-09-06 DIAGNOSIS — Z6835 Body mass index (BMI) 35.0-35.9, adult: Secondary | ICD-10-CM | POA: Diagnosis not present

## 2017-09-06 DIAGNOSIS — N3281 Overactive bladder: Secondary | ICD-10-CM | POA: Diagnosis not present

## 2017-09-06 DIAGNOSIS — R1084 Generalized abdominal pain: Secondary | ICD-10-CM | POA: Diagnosis not present

## 2017-09-06 DIAGNOSIS — K219 Gastro-esophageal reflux disease without esophagitis: Secondary | ICD-10-CM | POA: Diagnosis not present

## 2017-09-06 DIAGNOSIS — F411 Generalized anxiety disorder: Secondary | ICD-10-CM | POA: Diagnosis not present

## 2017-09-06 DIAGNOSIS — B20 Human immunodeficiency virus [HIV] disease: Secondary | ICD-10-CM | POA: Diagnosis not present

## 2017-09-06 DIAGNOSIS — Z780 Asymptomatic menopausal state: Secondary | ICD-10-CM | POA: Diagnosis not present

## 2017-09-13 ENCOUNTER — Other Ambulatory Visit: Payer: Self-pay | Admitting: Infectious Diseases

## 2017-09-13 ENCOUNTER — Other Ambulatory Visit: Payer: Self-pay | Admitting: Family Medicine

## 2017-09-14 ENCOUNTER — Other Ambulatory Visit: Payer: Self-pay | Admitting: Family Medicine

## 2017-09-26 DIAGNOSIS — N951 Menopausal and female climacteric states: Secondary | ICD-10-CM | POA: Diagnosis not present

## 2017-09-26 DIAGNOSIS — I10 Essential (primary) hypertension: Secondary | ICD-10-CM | POA: Diagnosis not present

## 2017-09-26 DIAGNOSIS — E782 Mixed hyperlipidemia: Secondary | ICD-10-CM | POA: Diagnosis not present

## 2017-09-26 DIAGNOSIS — R109 Unspecified abdominal pain: Secondary | ICD-10-CM | POA: Diagnosis not present

## 2017-09-26 DIAGNOSIS — Z0001 Encounter for general adult medical examination with abnormal findings: Secondary | ICD-10-CM | POA: Diagnosis not present

## 2017-09-26 DIAGNOSIS — G4700 Insomnia, unspecified: Secondary | ICD-10-CM | POA: Diagnosis not present

## 2017-09-26 DIAGNOSIS — F411 Generalized anxiety disorder: Secondary | ICD-10-CM | POA: Diagnosis not present

## 2017-09-26 DIAGNOSIS — F410 Panic disorder [episodic paroxysmal anxiety] without agoraphobia: Secondary | ICD-10-CM | POA: Diagnosis not present

## 2017-09-26 DIAGNOSIS — N3281 Overactive bladder: Secondary | ICD-10-CM | POA: Diagnosis not present

## 2017-09-26 DIAGNOSIS — B20 Human immunodeficiency virus [HIV] disease: Secondary | ICD-10-CM | POA: Diagnosis not present

## 2017-09-26 DIAGNOSIS — R7301 Impaired fasting glucose: Secondary | ICD-10-CM | POA: Diagnosis not present

## 2017-09-26 DIAGNOSIS — K219 Gastro-esophageal reflux disease without esophagitis: Secondary | ICD-10-CM | POA: Diagnosis not present

## 2017-10-06 ENCOUNTER — Encounter (INDEPENDENT_AMBULATORY_CARE_PROVIDER_SITE_OTHER): Payer: Self-pay

## 2017-10-06 ENCOUNTER — Encounter: Payer: Self-pay | Admitting: Obstetrics and Gynecology

## 2017-10-06 ENCOUNTER — Ambulatory Visit (INDEPENDENT_AMBULATORY_CARE_PROVIDER_SITE_OTHER): Payer: Medicaid Other | Admitting: Obstetrics and Gynecology

## 2017-10-06 VITALS — BP 130/90 | HR 88 | Ht 64.0 in | Wt 211.4 lb

## 2017-10-06 DIAGNOSIS — R300 Dysuria: Secondary | ICD-10-CM

## 2017-10-06 LAB — POCT URINALYSIS DIPSTICK
Glucose, UA: NEGATIVE
KETONES UA: NEGATIVE
NITRITE UA: NEGATIVE
PROTEIN UA: NEGATIVE

## 2017-10-06 MED ORDER — ESTRADIOL 0.05 MG/24HR TD PTTW: 1.0000 | MEDICATED_PATCH | TRANSDERMAL | 12 refills | Status: DC

## 2017-10-06 NOTE — Progress Notes (Signed)
Patient ID: Chelsey Henson, female   DOB: [birth year], 47 y.o.   MRN: [id number]    [facility] Visit  @DATE @            Patient name: Chelsey Henson MRN [id number]  Date of birth: [birth year]  CC & HPI:  Chelsey Henson is a 47 y.o. female [place] presenting today for possible menopause. In 2010, she had a partial hysterectomy and her periods stopped. In 09/2014, doctors found an abdominal mass that was apparently part of her uterus. She had this mass and her ovaries removed, which sent her into menopause. She also reports hot flashes. She feels pulling/tugging where the mesh implant was and has not tried an abdominal binder. She is concerned about a possible UTI, which she suffers from frequently.   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 Her weight is slowly increasing but she does a lot of walking during because she works as a Educational psychologist. She has undetectable HIV and Herpes, which she takes medication for. The patient denies fever, chills or any other symptoms or complaints at this time.   ROS:  ROS + hot flashes + weight gain + tugging/pulling where mesh implant was - fever - chills All systems are negative except as noted in the HPI and PMH.   Pertinent History Reviewed:   Reviewed:  Medical         Past Medical History:  Diagnosis Date  . Anemia   . Anxiety   . Asthma   . Depression   . Genital herpes   . GERD (gastroesophageal reflux disease)   . HIV infection ([place]) 2008  . Hypertension   . Sleep apnea   . Substance abuse ([place])    RECOVERED 7 YEARS - COCAINE                              Surgical Hx:    Past Surgical History:  Procedure Laterality Date  . ABDOMINAL HYSTERECTOMY     BENIGN MASS  . ESOPHAGOGASTRODUODENOSCOPY N/A 06/30/2017   Procedure: ESOPHAGOGASTRODUODENOSCOPY (EGD);  Surgeon: Rogene Houston, MD;  Location[facility];   Service: Endoscopy;  Laterality: N/A;  2:00  . HERNIA REPAIR  2017   Medications: Reviewed & Updated - see associated section                       Current Outpatient Medications:  .  acyclovir (ZOVIRAX) 800 MG tablet, TAKE 1 TABLET EVERY DAY. (Patient taking differently: Take 800 mg by mouth at bedtime), Disp: 90 tablet, Rfl: 3 .  albuterol (PROVENTIL HFA;VENTOLIN HFA) 108 (90 Base) MCG/ACT inhaler, Inhale 2 puffs into the lungs every 4 (four) hours as needed for wheezing or shortness of breath., Disp: 1 Inhaler, Rfl: 3 .  albuterol (PROVENTIL) (2.5 MG/3ML) 0.083% nebulizer solution, Take 3 mLs (2.5 mg total) by nebulization every 6 (six) hours as needed for wheezing or shortness of breath., Disp: 75 mL, Rfl: 5 .  benzonatate (TESSALON) 100 MG capsule, Take 1 capsule (100 mg total) by mouth every 8 (eight) hours., Disp: 21 capsule, Rfl: 0 .  bismuth-metronidazole-tetracycline (PYLERA) 140-125-125 MG capsule, Take 3 capsules by mouth 4 (four) times daily -  before meals and at bedtime., Disp: 120 capsule, Rfl: 0 .  doxycycline (VIBRAMYCIN)  100 MG capsule, Take 1 capsule (100 mg total) by mouth 2 (two) times daily., Disp: 20 capsule, Rfl: 0 .  escitalopram (LEXAPRO) 20 MG tablet, TAKE (1) TABLET BY MOUTH ONCE DAILY. (Patient taking differently: Take 20 mg by mouth at bedtime), Disp: 90 tablet, Rfl: 1 .  estradiol (CLIMARA - DOSED IN MG/24 HR) 0.025 mg/24hr patch, Place 1 patch (0.025 mg total) onto the skin every Saturday., Disp: 8 patch, Rfl: 1 .  famotidine (PEPCID) 40 MG tablet, Take 1 tablet (40 mg total) by mouth 2 (two) times daily., Disp: 60 tablet, Rfl: 5 .  GENVOYA 150-150-200-10 MG TABS tablet, TAKE ONE TABLET BY MOUTH ONCE DAILY. (Patient taking differently: Take 1 tablet by mouth at bedtime), Disp: 30 tablet, Rfl: 4 .  ibuprofen (IBU) 800 MG tablet, TAKE (1) TABLET BY MOUTH EVERY EIGHT HOURS AS NEEDED. (Patient taking differently: Take 800 mg by mouth every 8 (eight) hours as needed for  headache or moderate pain. ), Disp: 50 tablet, Rfl: 3 .  lisinopril-hydrochlorothiazide (PRINZIDE,ZESTORETIC) 10-12.5 MG tablet, Take 1 tablet by mouth daily., Disp: 90 tablet, Rfl: 3 .  Melatonin 1 MG CAPS, Take 2 mg by mouth at bedtime., Disp: , Rfl:  .  montelukast (SINGULAIR) 10 MG tablet, Take 1 tablet (10 mg total) by mouth at bedtime., Disp: 30 tablet, Rfl: 3 .  oxybutynin (DITROPAN-XL) 10 MG 24 hr tablet, TAKE (1) TABLET BY MOUTH AT BEDTIME., Disp: 90 tablet, Rfl: 0 .  predniSONE (DELTASONE) 20 MG tablet, Take 2 tablets (40 mg total) by mouth daily., Disp: 5 tablet, Rfl: 0 .  traZODone (DESYREL) 100 MG tablet, TAKE (1) TABLET BY MOUTH AT BEDTIME., Disp: 90 tablet, Rfl: 0  Social History: Reviewed -  reports that she has never smoked. She has never used smokeless tobacco.  Objective Findings:  Vitals: Last menstrual period 09/30/2014.  PHYSICAL EXAMINATION General appearance - alert, well appearing, and in no distress, oriented to person, place, and time and overweight Mental status - alert, oriented to person, place, and time, normal mood, behavior, speech, dress, motor activity, and thought processes, affect appropriate to mood. Great sense of humor.  PELVIC DEFERRED  Assessment & Plan:   A:  1. UTI 2. Menopause Management 3. HIV stable 4. Chronic lung probs 5. Obesity Body mass index is 36.29 kg/m. 6.   P:  1. Double strength on estrogen patch 2. Rx Macrodantin BID for 7 days  By signing my name below, I, De Burrs, attest that this documentation has been prepared under the direction and in the presence of Jonnie Kind, MD. Electronically Signed: De Burrs, Medical Scribe. 10/06/17. 2:35 PM.  I personally performed the services described in this documentation, which was SCRIBED in my presence. The recorded information has been reviewed and considered accurate. It has been edited as necessary during review. Jonnie Kind, MD

## 2017-10-07 ENCOUNTER — Telehealth: Payer: Self-pay | Admitting: *Deleted

## 2017-10-07 NOTE — Telephone Encounter (Signed)
Pt aware I called in antibiotic to [facility]-Macrodantin BID x 7 days per Dr. Johnnye Sima 10/06/17 office visit note. [place]

## 2017-10-26 NOTE — Congregational Nurse Program (Signed)
Congregational Nurse Program Note  Date of Encounter: 10/26/2017  Past Medical History: Past Medical History:  Diagnosis Date  . Anemia   . Anxiety   . Asthma   . Depression   . Genital herpes   . GERD (gastroesophageal reflux disease)   . HIV infection ([place]) 2008  . Hypertension   . Sleep apnea   . Substance abuse ([place])    RECOVERED 7 YEARS - COCAINE  . Vaginal Pap smear, abnormal     Encounter Details: CNP Questionnaire - 10/17/17 1126      Questionnaire   Patient Status  Not Applicable    Race  [place] or Caucasian    Location Patient Served At  [place], [place]  Medicaid    Uninsured  Not Applicable    Food  No food insecurities    Housing/Utilities  Yes, have permanent housing    Transportation  No transportation needs    Interpersonal Safety  Yes, feel physically and emotionally safe where you currently live    Medication  No medication insecurities    Medical Provider  Yes    Referrals  Not Applicable    ED Visit Averted  Not Applicable    Life-Saving Intervention Made  Not Applicable     Seen at the food pantry. No voiced complaints B P [address] [employer], [phone]

## 2017-11-10 ENCOUNTER — Other Ambulatory Visit: Payer: Medicaid Other

## 2017-11-10 DIAGNOSIS — Z21 Asymptomatic human immunodeficiency virus [HIV] infection status: Secondary | ICD-10-CM

## 2017-11-10 DIAGNOSIS — B20 Human immunodeficiency virus [HIV] disease: Secondary | ICD-10-CM

## 2017-11-11 LAB — T-HELPER CELL (CD4) - (RCID CLINIC ONLY)
CD4 % Helper T Cell: 37 % (ref 33–55)
CD4 T Cell Abs: 750 /uL (ref 400–2700)

## 2017-11-15 LAB — HIV-1 RNA QUANT-NO REFLEX-BLD
HIV 1 RNA Quant: <20 copies/mL
HIV-1 RNA QUANT, LOG: NOT DETECTED {Log_copies}/mL

## 2017-11-16 DIAGNOSIS — Z23 Encounter for immunization: Secondary | ICD-10-CM | POA: Diagnosis not present

## 2017-11-24 ENCOUNTER — Emergency Department (HOSPITAL_COMMUNITY): Payer: Medicaid Other

## 2017-11-24 ENCOUNTER — Emergency Department (HOSPITAL_COMMUNITY)
Admission: EM | Admit: 2017-11-24 | Discharge: 2017-11-24 | Disposition: A | Discharge status: Home / Self Care | Payer: Medicaid Other | Attending: Emergency Medicine | Admitting: Emergency Medicine

## 2017-11-24 ENCOUNTER — Ambulatory Visit: Payer: Self-pay | Admitting: Internal Medicine

## 2017-11-24 ENCOUNTER — Other Ambulatory Visit: Payer: Self-pay

## 2017-11-24 ENCOUNTER — Encounter (HOSPITAL_COMMUNITY): Payer: Self-pay | Admitting: Emergency Medicine

## 2017-11-24 DIAGNOSIS — J0100 Acute maxillary sinusitis, unspecified: Secondary | ICD-10-CM | POA: Diagnosis not present

## 2017-11-24 DIAGNOSIS — I10 Essential (primary) hypertension: Secondary | ICD-10-CM | POA: Insufficient documentation

## 2017-11-24 DIAGNOSIS — B20 Human immunodeficiency virus [HIV] disease: Secondary | ICD-10-CM | POA: Diagnosis not present

## 2017-11-24 DIAGNOSIS — J45909 Unspecified asthma, uncomplicated: Secondary | ICD-10-CM | POA: Diagnosis not present

## 2017-11-24 DIAGNOSIS — Z79899 Other long term (current) drug therapy: Secondary | ICD-10-CM | POA: Insufficient documentation

## 2017-11-24 DIAGNOSIS — J111 Influenza due to unidentified influenza virus with other respiratory manifestations: Secondary | ICD-10-CM

## 2017-11-24 DIAGNOSIS — J029 Acute pharyngitis, unspecified: Secondary | ICD-10-CM | POA: Diagnosis present

## 2017-11-24 DIAGNOSIS — R062 Wheezing: Secondary | ICD-10-CM | POA: Diagnosis not present

## 2017-11-24 DIAGNOSIS — Z21 Asymptomatic human immunodeficiency virus [HIV] infection status: Secondary | ICD-10-CM

## 2017-11-24 DIAGNOSIS — R112 Nausea with vomiting, unspecified: Secondary | ICD-10-CM | POA: Diagnosis not present

## 2017-11-24 DIAGNOSIS — R69 Illness, unspecified: Secondary | ICD-10-CM

## 2017-11-24 DIAGNOSIS — R05 Cough: Secondary | ICD-10-CM | POA: Diagnosis not present

## 2017-11-24 HISTORY — DX: Human immunodeficiency virus (HIV) disease: B20

## 2017-11-24 HISTORY — DX: Asymptomatic human immunodeficiency virus (hiv) infection status: Z21

## 2017-11-24 LAB — COMPREHENSIVE METABOLIC PANEL
ALT: 105 U/L — ABNORMAL HIGH (ref 0–44)
ANION GAP: 10 (ref 5–15)
AST: 57 U/L — AB (ref 15–41)
Albumin: 4.2 g/dL (ref 3.5–5.0)
Alkaline Phosphatase: 57 U/L (ref 38–126)
BILIRUBIN TOTAL: 0.9 mg/dL (ref 0.3–1.2)
BUN: 12 mg/dL (ref 6–20)
CALCIUM: 9 mg/dL (ref 8.9–10.3)
CO2: 24 mmol/L (ref 22–32)
Chloride: 105 mmol/L (ref 98–111)
Creatinine, Ser: 0.66 mg/dL (ref 0.44–1.00)
GFR calc Af Amer: >60 mL/min (ref >60)
Glucose, Bld: 117 mg/dL — ABNORMAL HIGH (ref 70–99)
POTASSIUM: 3.6 mmol/L (ref 3.5–5.1)
Sodium: 139 mmol/L (ref 135–145)
Total Protein: 8.2 g/dL — ABNORMAL HIGH (ref 6.5–8.1)

## 2017-11-24 LAB — URINALYSIS, ROUTINE W REFLEX MICROSCOPIC
Bilirubin Urine: NEGATIVE
GLUCOSE, UA: NEGATIVE mg/dL
Hgb urine dipstick: NEGATIVE
KETONES UR: NEGATIVE mg/dL
LEUKOCYTES UA: NEGATIVE
Nitrite: NEGATIVE
PROTEIN: NEGATIVE mg/dL
Specific Gravity, Urine: 1.02 (ref 1.005–1.030)
pH: 6 (ref 5.0–8.0)

## 2017-11-24 LAB — CBC WITH DIFFERENTIAL/PLATELET
Basophils Absolute: 0 10*3/uL (ref 0.0–0.1)
Basophils Relative: 1 %
Eosinophils Absolute: 0.2 10*3/uL (ref 0.0–0.7)
Eosinophils Relative: 3 %
HEMATOCRIT: 40.6 % (ref 36.0–46.0)
Hemoglobin: 14.2 g/dL (ref 12.0–15.0)
LYMPHS ABS: 1.5 10*3/uL (ref 0.7–4.0)
LYMPHS PCT: 24 %
MCH: 32.4 pg (ref 26.0–34.0)
MCHC: 35 g/dL (ref 30.0–36.0)
MCV: 92.7 fL (ref 78.0–100.0)
MONO ABS: 0.7 10*3/uL (ref 0.1–1.0)
MONOS PCT: 10 %
NEUTROS ABS: 4 10*3/uL (ref 1.7–7.7)
Neutrophils Relative %: 62 %
Platelets: 191 10*3/uL (ref 150–400)
RBC: 4.38 MIL/uL (ref 3.87–5.11)
RDW: 12.5 % (ref 11.5–15.5)
WBC: 6.4 10*3/uL (ref 4.0–10.5)

## 2017-11-24 LAB — LIPASE, BLOOD: LIPASE: 41 U/L (ref 11–51)

## 2017-11-24 LAB — INFLUENZA PANEL BY PCR (TYPE A & B)
Influenza A By PCR: NEGATIVE
Influenza B By PCR: NEGATIVE

## 2017-11-24 LAB — GROUP A STREP BY PCR: GROUP A STREP BY PCR: NOT DETECTED

## 2017-11-24 MED ORDER — ONDANSETRON 4 MG PO TBDP: 4.0000 mg | ORAL_TABLET | Freq: Three times a day (TID) | ORAL | 0 refills | Status: DC | PRN

## 2017-11-24 MED ORDER — ONDANSETRON HCL 4 MG/2ML IJ SOLN
4.0000 mg | Freq: Once | INTRAMUSCULAR | Status: AC
  Administered 2017-11-24: 4 mg via INTRAVENOUS
  Filled 2017-11-24: qty 2

## 2017-11-24 MED ORDER — AMOXICILLIN-POT CLAVULANATE 875-125 MG PO TABS: 1.0000 | ORAL_TABLET | Freq: Two times a day (BID) | ORAL | 0 refills | Status: DC

## 2017-11-24 MED ORDER — SODIUM CHLORIDE 0.9 % IV BOLUS
1000.0000 mL | Freq: Once | INTRAVENOUS | Status: AC
Start: 2017-11-24 — End: 2017-11-24
  Administered 2017-11-24: 1000 mL via INTRAVENOUS

## 2017-11-24 MED ORDER — IPRATROPIUM-ALBUTEROL 0.5-2.5 (3) MG/3ML IN SOLN
3.0000 mL | Freq: Once | RESPIRATORY_TRACT | Status: AC
  Administered 2017-11-24: 3 mL via RESPIRATORY_TRACT
  Filled 2017-11-24: qty 3

## 2017-11-24 NOTE — ED Provider Notes (Signed)
[facility] EMERGENCY DEPARTMENT Provider Note   CSN: [id number] Arrival date & time: 11/24/17  0539     History   Chief Complaint Chief Complaint  Patient presents with  . Sore Throat    flu like symptoms    HPI Chelsey Henson is a 47 y.o. female.  Patient states she received a flu shot 8 days ago and began to feel sick about 5 days ago.  This started as a sore throat, has progressed to chills, T-max of 100, body aches, sinus pressure, congestion and rhinorrhea.  Had 2 episodes of vomiting and nausea yesterday and 2 episodes of loose stool.  Denies abdominal pain.  Denies sick contacts.  She does have a history of asthma as well as well-controlled HIV.  CD4 count done on November 10, 2017 is 750 and viral load is undetectable.  States she has never been admitted for her breathing or for an asthma exacerbation. She denies any headache or chest pain or abdominal pain.  She has not taken any Tylenol or ibuprofen at home. Her main complaint is sore throat with sinus congestion and pressure as well as nausea and states that she cannot keep anything down.  The history is provided by the patient.  Sore Throat  Pertinent negatives include no chest pain, no abdominal pain, no headaches and no shortness of breath.    Past Medical History:  Diagnosis Date  . Anemia   . Anxiety   . Asthma   . Depression   . Genital herpes   . GERD (gastroesophageal reflux disease)   . HIV (human immunodeficiency virus infection) ([place]) 11/24/2017  . HIV infection ([place]) 2008  . Hypertension   . Sleep apnea   . Substance abuse ([place])    RECOVERED 7 YEARS - COCAINE  . Vaginal Pap smear, abnormal     Patient Active Problem List   Diagnosis Date Noted  . Acute bronchitis with asthma 07/26/2017  . Transaminitis 05/24/2017  . Vaccine counseling 05/24/2017  . Gastroesophageal reflux disease without esophagitis 04/06/2017  . MDD (major depressive disorder), recurrent episode, moderate ([place]) 09/08/2016    . Cocaine use disorder, moderate, in sustained remission ([place]) 09/08/2016  . Abdominal pain 07/19/2016  . OSA (obstructive sleep apnea) 04/15/2016  . Asthma 04/15/2016  . HIV (human immunodeficiency virus infection) ([place]) 04/05/2016  . Essential hypertension 04/05/2016  . Chronic insomnia 04/05/2016    Past Surgical History:  Procedure Laterality Date  . ABDOMINAL HYSTERECTOMY     BENIGN MASS  . CESAREAN SECTION    . ESOPHAGOGASTRODUODENOSCOPY N/A 06/30/2017   Procedure: ESOPHAGOGASTRODUODENOSCOPY (EGD);  Surgeon: Rogene Houston, MD;  Location[facility];  Service: Endoscopy;  Laterality: N/A;  2:00  . HERNIA REPAIR  2017     OB History    Gravida  6   Para  4   Term  4   Preterm      AB  2   Living  4     SAB  2   TAB      Ectopic      Multiple      Live Births               Home Medications    Prior to Admission medications   Medication Sig Start Date End Date Taking? Authorizing Provider  acyclovir (ZOVIRAX) 800 MG tablet TAKE 1 TABLET EVERY DAY. Patient taking differently: Take 800 mg by mouth at bedtime 11/02/16   Raylene Everts, MD  albuterol (  PROVENTIL HFA;VENTOLIN HFA) 108 (90 Base) MCG/ACT inhaler Inhale 2 puffs into the lungs every 4 (four) hours as needed for wheezing or shortness of breath. 07/09/17   Noemi Chapel, MD  albuterol (PROVENTIL) (2.5 MG/3ML) 0.083% nebulizer solution Take 3 mLs (2.5 mg total) by nebulization every 6 (six) hours as needed for wheezing or shortness of breath. 07/26/17   Comer, Okey Regal, MD  escitalopram (LEXAPRO) 20 MG tablet TAKE (1) TABLET BY MOUTH ONCE DAILY. Patient taking differently: Take 20 mg by mouth at bedtime 02/03/17   Raylene Everts, MD  estradiol (VIVELLE-DOT) 0.05 MG/24HR patch Place 1 patch (0.05 mg total) onto the skin 2 (two) times a week. 10/06/17   Jonnie Kind, MD  famotidine (PEPCID) 40 MG tablet Take 1 tablet (40 mg total) by mouth 2 (two) times daily. 06/30/17   Rehman, Mechele Dawley, MD   GENVOYA 150-150-200-10 MG TABS tablet TAKE ONE TABLET BY MOUTH ONCE DAILY. Patient taking differently: Take 1 tablet by mouth at bedtime 06/15/17   Thayer Headings, MD  ibuprofen (IBU) 800 MG tablet TAKE (1) TABLET BY MOUTH EVERY EIGHT HOURS AS NEEDED. Patient taking differently: Take 800 mg by mouth every 8 (eight) hours as needed for headache or moderate pain.  03/07/17   Raylene Everts, MD  lisinopril-hydrochlorothiazide (PRINZIDE,ZESTORETIC) 10-12.5 MG tablet Take 1 tablet by mouth daily. 11/16/16   Raylene Everts, MD  Melatonin 1 MG CAPS Take 2 mg by mouth at bedtime.    [provider]  montelukast (SINGULAIR) 10 MG tablet Take 1 tablet (10 mg total) by mouth at bedtime. 07/26/17   Thayer Headings, MD  oxybutynin (DITROPAN-XL) 10 MG 24 hr tablet TAKE (1) TABLET BY MOUTH AT BEDTIME. 06/20/17   Raylene Everts, MD  predniSONE (DELTASONE) 20 MG tablet Take 2 tablets (40 mg total) by mouth daily. 07/26/17   Thayer Headings, MD  traZODone (DESYREL) 100 MG tablet TAKE (1) TABLET BY MOUTH AT BEDTIME. 06/20/17   Raylene Everts, MD    Family History Family History  Problem Relation Age of Onset  . Depression Mother   . Hyperlipidemia Mother   . Learning disabilities Mother   . Alcohol abuse Mother 14  . Alcohol abuse Father   . Arthritis Father   . Cancer Father   . Heart disease Father   . Hypertension Father   . Learning disabilities Father   . Alcohol abuse Sister        RECOVERED  . Alcohol abuse Brother   . Alcohol abuse Brother   . Alcohol abuse Brother     Social History Social History   Tobacco Use  . Smoking status: Never Smoker  . Smokeless tobacco: Never Used  Substance Use Topics  . Alcohol use: Yes    Alcohol/week: 1.0 standard drinks    Types: 1 Cans of beer per week    Comment: occ.  . Drug use: No    Comment: none in 7 years     Allergies   Abilify [aripiprazole] and Codeine   Review of Systems Review of Systems  Constitutional:  Positive for activity change, appetite change, chills and fatigue.  HENT: Positive for congestion, postnasal drip, rhinorrhea, sinus pressure, sinus pain, sore throat, trouble swallowing and voice change.   Eyes: Negative for visual disturbance.  Respiratory: Positive for cough. Negative for shortness of breath.   Cardiovascular: Negative for chest pain.  Gastrointestinal: Positive for diarrhea, nausea and vomiting. Negative for abdominal pain.  Genitourinary: Negative for dysuria, flank pain, hematuria, vaginal bleeding and vaginal discharge.  Musculoskeletal: Positive for arthralgias and myalgias.  Neurological: Positive for weakness. Negative for dizziness, seizures, numbness and headaches.   all other systems are negative except as noted in the HPI and PMH.    Physical Exam Updated Vital Signs BP (!) 141/91   Pulse (!) 104   Temp 98.8 F (37.1 C) (Oral)   Resp 16   Ht 5' 4"  (1.626 m)   Wt 94.8 kg   LMP 09/30/2014 (Approximate)   SpO2 96%   BMI 35.87 kg/m   Physical Exam  Constitutional: She is oriented to person, place, and time. She appears well-developed and well-nourished. No distress.  Hoarse voice  HENT:  Head: Normocephalic and atraumatic.  Mouth/Throat: Oropharynx is clear and moist. No oropharyngeal exudate.  Rhinorrhea present, maxillary sinus tenderness Diffuse oropharynx erythema without asymmetry or exudate Nasal congestion present.  Eyes: Pupils are equal, round, and reactive to light. Conjunctivae and EOM are normal.  Neck: Normal range of motion. Neck supple.  No meningismus.  Cardiovascular: Normal rate, regular rhythm, normal heart sounds and intact distal pulses.  No murmur heard. Pulmonary/Chest: Effort normal. No respiratory distress. She has wheezes. She exhibits no tenderness.  Scattered expiratory wheezing bilaterally with diminished air exchange  Abdominal: Soft. There is no tenderness. There is no rebound and no guarding.  Musculoskeletal:  Normal range of motion. She exhibits no edema or tenderness.  Neurological: She is alert and oriented to person, place, and time. No cranial nerve deficit. She exhibits normal muscle tone. Coordination normal.  No ataxia on finger to nose bilaterally. No pronator drift. 5/5 strength throughout. CN 2-12 intact.Equal grip strength. Sensation intact.   Skin: Skin is warm. Capillary refill takes less than 2 seconds. No rash noted.  Psychiatric: She has a normal mood and affect. Her behavior is normal.  Nursing note and vitals reviewed.    ED Treatments / Results  Labs (all labs ordered are listed, but only abnormal results are displayed) Labs Reviewed  COMPREHENSIVE METABOLIC PANEL - Abnormal; Notable for the following components:      Result Value   Glucose, Bld 117 (*)    Total Protein 8.2 (*)    AST 57 (*)    ALT 105 (*)    All other components within normal limits  GROUP A STREP BY PCR  CBC WITH DIFFERENTIAL/PLATELET  LIPASE, BLOOD  URINALYSIS, ROUTINE W REFLEX MICROSCOPIC  INFLUENZA PANEL BY PCR (TYPE A & B)    EKG None  Radiology Dg Chest 2 View  Result Date: 11/24/2017 CLINICAL DATA:  Cough.  Sore throat. EXAM: CHEST - 2 VIEW COMPARISON:  07/09/2017. FINDINGS: Mediastinum and hilar structures normal. Lungs are clear. Heart size normal. No acute bony abnormality. IMPRESSION: No acute cardiopulmonary disease. Electronically Signed   By: Marcello Moores  Register   On: 11/24/2017 07:22    Procedures Procedures (including critical care time)  Medications Ordered in ED Medications  sodium chloride 0.9 % bolus 1,000 mL (has no administration in time range)  ondansetron (ZOFRAN) injection 4 mg (has no administration in time range)  ipratropium-albuterol (DUONEB) 0.5-2.5 (3) MG/3ML nebulizer solution 3 mL (3 mLs Nebulization Given 11/24/17 0612)     Initial Impression / Assessment and Plan / ED Course  I have reviewed the triage vital signs and the nursing notes.  Pertinent labs &  imaging results that were available during my care of the patient were reviewed by me and considered in my  medical decision making (see chart for details).    Patient with history of asthma as well as well-controlled HIV presenting with 4-day history of sore throat, sinus congestion, cough, nausea vomiting diarrhea.  Stable vitals without hypoxia. Nebs ordered for wheezing.   With n/v/d, will hydrate and check labs.  UA negative.  CXR negative. Labs reassuring.   Tolerating PO in the ED. Wheezing has improved after nebulizers x2.   With nasal congestion and sinus tenderness, will treat for sinusitis. Flu swab negative.   Patient states she has ID followup today. Return precautions discussed. BP (!) 125/97   Pulse 95   Temp 98.8 F (37.1 C) (Oral)   Resp 18   Ht 5' 4"  (1.626 m)   Wt 94.8 kg   LMP 09/30/2014 (Approximate)   SpO2 98%   BMI 35.87 kg/m    Final Clinical Impressions(s) / ED Diagnoses   Final diagnoses:  Acute maxillary sinusitis, recurrence not specified  Influenza-like illness    ED Discharge Orders    None       Peytin Dechert, Annie Main, MD 11/24/17 [phone]

## 2017-11-24 NOTE — ED Triage Notes (Signed)
Patient states Flu like symptoms with a sore throat, chills, and  some nausea and vomiting that happened yesterday. Patient states symptoms have be ongoing over the last two to 3 days.

## 2017-11-24 NOTE — Discharge Instructions (Signed)
Keep yourself hydrated. Take the antibiotics as prescribed. Use tylenol or ibuprofen as needed for fever and aches. Followup with your doctor. Return to the ED if you have new or worsening symptoms.

## 2017-11-24 NOTE — ED Notes (Signed)
Respiratory notified.

## 2017-12-22 ENCOUNTER — Other Ambulatory Visit: Payer: Self-pay | Admitting: Infectious Diseases

## 2018-01-03 DIAGNOSIS — R945 Abnormal results of liver function studies: Secondary | ICD-10-CM | POA: Diagnosis not present

## 2018-01-03 DIAGNOSIS — I10 Essential (primary) hypertension: Secondary | ICD-10-CM | POA: Diagnosis not present

## 2018-01-03 DIAGNOSIS — R7301 Impaired fasting glucose: Secondary | ICD-10-CM | POA: Diagnosis not present

## 2018-01-03 DIAGNOSIS — E782 Mixed hyperlipidemia: Secondary | ICD-10-CM | POA: Diagnosis not present

## 2018-01-10 DIAGNOSIS — R7301 Impaired fasting glucose: Secondary | ICD-10-CM | POA: Diagnosis not present

## 2018-01-10 DIAGNOSIS — R140 Abdominal distension (gaseous): Secondary | ICD-10-CM | POA: Diagnosis not present

## 2018-01-10 DIAGNOSIS — N3281 Overactive bladder: Secondary | ICD-10-CM | POA: Diagnosis not present

## 2018-01-10 DIAGNOSIS — G4700 Insomnia, unspecified: Secondary | ICD-10-CM | POA: Diagnosis not present

## 2018-01-10 DIAGNOSIS — B20 Human immunodeficiency virus [HIV] disease: Secondary | ICD-10-CM | POA: Diagnosis not present

## 2018-01-10 DIAGNOSIS — J45909 Unspecified asthma, uncomplicated: Secondary | ICD-10-CM | POA: Diagnosis not present

## 2018-01-10 DIAGNOSIS — N951 Menopausal and female climacteric states: Secondary | ICD-10-CM | POA: Diagnosis not present

## 2018-01-10 DIAGNOSIS — I10 Essential (primary) hypertension: Secondary | ICD-10-CM | POA: Diagnosis not present

## 2018-01-10 DIAGNOSIS — F410 Panic disorder [episodic paroxysmal anxiety] without agoraphobia: Secondary | ICD-10-CM | POA: Diagnosis not present

## 2018-01-10 DIAGNOSIS — K219 Gastro-esophageal reflux disease without esophagitis: Secondary | ICD-10-CM | POA: Diagnosis not present

## 2018-01-10 DIAGNOSIS — F411 Generalized anxiety disorder: Secondary | ICD-10-CM | POA: Diagnosis not present

## 2018-01-10 DIAGNOSIS — R945 Abnormal results of liver function studies: Secondary | ICD-10-CM | POA: Diagnosis not present

## 2018-01-11 ENCOUNTER — Other Ambulatory Visit (HOSPITAL_COMMUNITY): Payer: Self-pay | Admitting: Adult Health Nurse Practitioner

## 2018-01-11 DIAGNOSIS — R140 Abdominal distension (gaseous): Secondary | ICD-10-CM

## 2018-01-16 ENCOUNTER — Ambulatory Visit (HOSPITAL_COMMUNITY)
Admission: RE | Admit: 2018-01-16 | Discharge: 2018-01-16 | Disposition: A | Discharge status: Home / Self Care | Payer: Medicaid Other | Source: Ambulatory Visit | Attending: Adult Health Nurse Practitioner | Admitting: Adult Health Nurse Practitioner

## 2018-01-16 DIAGNOSIS — K760 Fatty (change of) liver, not elsewhere classified: Secondary | ICD-10-CM | POA: Diagnosis not present

## 2018-01-16 DIAGNOSIS — K828 Other specified diseases of gallbladder: Secondary | ICD-10-CM | POA: Diagnosis not present

## 2018-01-16 DIAGNOSIS — R140 Abdominal distension (gaseous): Secondary | ICD-10-CM | POA: Insufficient documentation

## 2018-01-17 ENCOUNTER — Other Ambulatory Visit (HOSPITAL_COMMUNITY): Payer: Self-pay | Admitting: Internal Medicine

## 2018-01-17 DIAGNOSIS — R140 Abdominal distension (gaseous): Secondary | ICD-10-CM

## 2018-01-20 ENCOUNTER — Ambulatory Visit (HOSPITAL_COMMUNITY)
Admission: RE | Admit: 2018-01-20 | Discharge: 2018-01-20 | Disposition: A | Discharge status: Home / Self Care | Payer: Medicaid Other | Source: Ambulatory Visit | Attending: Internal Medicine | Admitting: Internal Medicine

## 2018-01-20 ENCOUNTER — Encounter (HOSPITAL_COMMUNITY): Payer: Self-pay

## 2018-01-20 DIAGNOSIS — R140 Abdominal distension (gaseous): Secondary | ICD-10-CM

## 2018-01-24 ENCOUNTER — Encounter (HOSPITAL_COMMUNITY): Payer: Self-pay | Admitting: Emergency Medicine

## 2018-01-24 ENCOUNTER — Emergency Department (HOSPITAL_COMMUNITY)
Admission: EM | Admit: 2018-01-24 | Discharge: 2018-01-24 | Disposition: A | Discharge status: Home / Self Care | Payer: Medicaid Other | Attending: Emergency Medicine | Admitting: Emergency Medicine

## 2018-01-24 ENCOUNTER — Emergency Department (HOSPITAL_COMMUNITY): Payer: Medicaid Other

## 2018-01-24 ENCOUNTER — Other Ambulatory Visit: Payer: Self-pay

## 2018-01-24 DIAGNOSIS — J45909 Unspecified asthma, uncomplicated: Secondary | ICD-10-CM | POA: Insufficient documentation

## 2018-01-24 DIAGNOSIS — Z21 Asymptomatic human immunodeficiency virus [HIV] infection status: Secondary | ICD-10-CM | POA: Diagnosis not present

## 2018-01-24 DIAGNOSIS — L089 Local infection of the skin and subcutaneous tissue, unspecified: Secondary | ICD-10-CM | POA: Diagnosis not present

## 2018-01-24 DIAGNOSIS — Z79899 Other long term (current) drug therapy: Secondary | ICD-10-CM | POA: Insufficient documentation

## 2018-01-24 DIAGNOSIS — F329 Major depressive disorder, single episode, unspecified: Secondary | ICD-10-CM | POA: Insufficient documentation

## 2018-01-24 DIAGNOSIS — I10 Essential (primary) hypertension: Secondary | ICD-10-CM | POA: Diagnosis not present

## 2018-01-24 DIAGNOSIS — Z23 Encounter for immunization: Secondary | ICD-10-CM | POA: Diagnosis not present

## 2018-01-24 DIAGNOSIS — M79604 Pain in right leg: Secondary | ICD-10-CM | POA: Diagnosis present

## 2018-01-24 DIAGNOSIS — F1421 Cocaine dependence, in remission: Secondary | ICD-10-CM | POA: Insufficient documentation

## 2018-01-24 DIAGNOSIS — F419 Anxiety disorder, unspecified: Secondary | ICD-10-CM | POA: Diagnosis not present

## 2018-01-24 DIAGNOSIS — S81811A Laceration without foreign body, right lower leg, initial encounter: Secondary | ICD-10-CM | POA: Diagnosis not present

## 2018-01-24 DIAGNOSIS — T148XXA Other injury of unspecified body region, initial encounter: Secondary | ICD-10-CM

## 2018-01-24 DIAGNOSIS — S8991XA Unspecified injury of right lower leg, initial encounter: Secondary | ICD-10-CM | POA: Diagnosis not present

## 2018-01-24 MED ORDER — CLINDAMYCIN HCL 300 MG PO CAPS: 300.0000 mg | ORAL_CAPSULE | Freq: Four times a day (QID) | ORAL | 0 refills | Status: DC

## 2018-01-24 MED ORDER — IBUPROFEN 400 MG PO TABS
600.0000 mg | ORAL_TABLET | Freq: Once | ORAL | Status: DC
  Filled 2018-01-24: qty 2

## 2018-01-24 MED ORDER — CLINDAMYCIN HCL 150 MG PO CAPS
300.0000 mg | ORAL_CAPSULE | Freq: Once | ORAL | Status: AC
  Administered 2018-01-24: 300 mg via ORAL
  Filled 2018-01-24: qty 2

## 2018-01-24 MED ORDER — TETANUS-DIPHTH-ACELL PERTUSSIS 5-2.5-18.5 LF-MCG/0.5 IM SUSP
0.5000 mL | Freq: Once | INTRAMUSCULAR | Status: AC
  Administered 2018-01-24: 0.5 mL via INTRAMUSCULAR
  Filled 2018-01-24: qty 0.5

## 2018-01-24 NOTE — ED Triage Notes (Signed)
Pt states she hit herself in the LLR leg with a hammer and is not c/o leg pain x 3 days ago

## 2018-01-24 NOTE — ED Provider Notes (Signed)
[facility] EMERGENCY DEPARTMENT Provider Note   CSN: [id number] Arrival date & time: 01/24/18  2128     History   Chief Complaint Chief Complaint  Patient presents with  . Leg Injury    HPI Chelsey Henson is a 47 y.o. female.  HPI Patient states she struck herself in the right lower leg when she was swinging a hammer 3 days ago.  Sustained small laceration in the pretibial space.  States pain has worsened over the last day.  Worse with weightbearing and when driving.  Mild surrounding redness.  Denies any fever or chills.  Last tetanus was 6 years ago. Past Medical History:  Diagnosis Date  . Anemia   . Anxiety   . Asthma   . Depression   . Genital herpes   . GERD (gastroesophageal reflux disease)   . HIV (human immunodeficiency virus infection) ([place]) 11/24/2017  . HIV infection ([place]) 2008  . Hypertension   . Sleep apnea   . Substance abuse ([place])    RECOVERED 7 YEARS - COCAINE  . Vaginal Pap smear, abnormal     Patient Active Problem List   Diagnosis Date Noted  . Acute bronchitis with asthma 07/26/2017  . Transaminitis 05/24/2017  . Vaccine counseling 05/24/2017  . Gastroesophageal reflux disease without esophagitis 04/06/2017  . MDD (major depressive disorder), recurrent episode, moderate ([place]) 09/08/2016  . Cocaine use disorder, moderate, in sustained remission ([place]) 09/08/2016  . Abdominal pain 07/19/2016  . OSA (obstructive sleep apnea) 04/15/2016  . Asthma 04/15/2016  . HIV (human immunodeficiency virus infection) ([place]) 04/05/2016  . Essential hypertension 04/05/2016  . Chronic insomnia 04/05/2016    Past Surgical History:  Procedure Laterality Date  . ABDOMINAL HYSTERECTOMY     BENIGN MASS  . CESAREAN SECTION    . ESOPHAGOGASTRODUODENOSCOPY N/A 06/30/2017   Procedure: ESOPHAGOGASTRODUODENOSCOPY (EGD);  Surgeon: Rogene Houston, MD;  [facility]: [facility];  Service: Endoscopy;  Laterality: N/A;  2:00  . HERNIA REPAIR  2017     OB History    Gravida  6   Para  4   Term  4   Preterm      AB  2   Living  4     SAB  2   TAB      Ectopic      Multiple      Live Births               Home Medications    Prior to Admission medications   Medication Sig Start Date End Date Taking? Authorizing Provider  acyclovir (ZOVIRAX) 800 MG tablet TAKE 1 TABLET EVERY DAY. Patient taking differently: Take 800 mg by mouth at bedtime 11/02/16  Yes Raylene Everts, MD  albuterol (PROVENTIL HFA;VENTOLIN HFA) 108 (90 Base) MCG/ACT inhaler Inhale 2 puffs into the lungs every 4 (four) hours as needed for wheezing or shortness of breath. 07/09/17  Yes Noemi Chapel, MD  albuterol (PROVENTIL) (2.5 MG/3ML) 0.083% nebulizer solution Take 3 mLs (2.5 mg total) by nebulization every 6 (six) hours as needed for wheezing or shortness of breath. 07/26/17  Yes Comer, Okey Regal, MD  escitalopram (LEXAPRO) 20 MG tablet TAKE (1) TABLET BY MOUTH ONCE DAILY. Patient taking differently: Take 20 mg by mouth at bedtime 02/03/17  Yes Raylene Everts, MD  estradiol (VIVELLE-DOT) 0.05 MG/24HR patch Place 1 patch (0.05 mg total) onto the skin 2 (two) times a week. 10/06/17  Yes Jonnie Kind, MD  clindamycin (CLEOCIN) 300  MG capsule Take 1 capsule (300 mg total) by mouth 4 (four) times daily. X 7 days 01/25/18   Julianne Rice, MD  famotidine (PEPCID) 40 MG tablet Take 1 tablet (40 mg total) by mouth 2 (two) times daily. 06/30/17   Rehman, Mechele Dawley, MD  GENVOYA 150-150-200-10 MG TABS tablet TAKE ONE TABLET BY MOUTH ONCE DAILY. 12/22/17   Thayer Headings, MD  ibuprofen (IBU) 800 MG tablet TAKE (1) TABLET BY MOUTH EVERY EIGHT HOURS AS NEEDED. Patient taking differently: Take 800 mg by mouth every 8 (eight) hours as needed for headache or moderate pain.  03/07/17   Raylene Everts, MD  lisinopril-hydrochlorothiazide (PRINZIDE,ZESTORETIC) 10-12.5 MG tablet Take 1 tablet by mouth daily. 11/16/16   Raylene Everts, MD  Melatonin 1 MG CAPS Take 2 mg by mouth at  bedtime.    [provider]  montelukast (SINGULAIR) 10 MG tablet Take 1 tablet (10 mg total) by mouth at bedtime. 07/26/17   Thayer Headings, MD  ondansetron (ZOFRAN ODT) 4 MG disintegrating tablet Take 1 tablet (4 mg total) by mouth every 8 (eight) hours as needed for nausea or vomiting. 11/24/17   Mesner, Corene Cornea, MD  oxybutynin (DITROPAN-XL) 10 MG 24 hr tablet TAKE (1) TABLET BY MOUTH AT BEDTIME. 06/20/17   Raylene Everts, MD  predniSONE (DELTASONE) 20 MG tablet Take 2 tablets (40 mg total) by mouth daily. 07/26/17   Thayer Headings, MD  traZODone (DESYREL) 100 MG tablet TAKE (1) TABLET BY MOUTH AT BEDTIME. 06/20/17   Raylene Everts, MD    Family History Family History  Problem Relation Age of Onset  . Depression Mother   . Hyperlipidemia Mother   . Learning disabilities Mother   . Alcohol abuse Mother 38  . Alcohol abuse Father   . Arthritis Father   . Cancer Father   . Heart disease Father   . Hypertension Father   . Learning disabilities Father   . Alcohol abuse Sister        RECOVERED  . Alcohol abuse Brother   . Alcohol abuse Brother   . Alcohol abuse Brother     Social History Social History   Tobacco Use  . Smoking status: Never Smoker  . Smokeless tobacco: Never Used  Substance Use Topics  . Alcohol use: Yes    Alcohol/week: 1.0 standard drinks    Types: 1 Cans of beer per week    Comment: occ.  . Drug use: No    Comment: none in 7 years     Allergies   Abilify [aripiprazole] and Codeine   Review of Systems Review of Systems  Constitutional: Negative for chills and fever.  Musculoskeletal: Positive for myalgias. Negative for arthralgias.  Skin: Positive for color change and wound.  Neurological: Negative for weakness and numbness.  All other systems reviewed and are negative.    Physical Exam Updated Vital Signs BP 117/77 (BP Location: Right Arm)   Pulse 70   Temp 98.1 F (36.7 C) (Oral)   Resp 18   Ht 5' 5"  (1.651 m)   Wt 90.7  kg   LMP 09/30/2014 (Approximate)   SpO2 96%   BMI 33.28 kg/m   Physical Exam  Constitutional: She is oriented to person, place, and time. She appears well-developed and well-nourished.  HENT:  Head: Normocephalic and atraumatic.  Eyes: Pupils are equal, round, and reactive to light. EOM are normal.  Neck: Normal range of motion. Neck supple.  Cardiovascular: Normal rate and  regular rhythm.  Pulmonary/Chest: Effort normal.  Abdominal: Soft. Bowel sounds are normal. There is no tenderness. There is no rebound and no guarding.  Musculoskeletal: Normal range of motion. She exhibits tenderness. She exhibits no edema.  Patient with less than 1 cm laceration over the anterior proximal tibia.  There is surrounding erythema, tenderness to palpation and warmth.  No fluctuance.  No streaking.  Full range of motion of the right knee and ankle without swelling, deformity or erythema.  Neurological: She is alert and oriented to person, place, and time.  Skin: Skin is warm and dry. No rash noted. No erythema.  Psychiatric: She has a normal mood and affect. Her behavior is normal.  Nursing note and vitals reviewed.    ED Treatments / Results  Labs (all labs ordered are listed, but only abnormal results are displayed) Labs Reviewed - No data to display  EKG None  Radiology No results found.  Procedures Procedures (including critical care time)  Medications Ordered in ED Medications  ibuprofen (ADVIL,MOTRIN) tablet 600 mg (600 mg Oral Not Given 01/24/18 2243)  Tdap (BOOSTRIX) injection 0.5 mL (0.5 mLs Intramuscular Given 01/24/18 2246)  clindamycin (CLEOCIN) capsule 300 mg (300 mg Oral Given 01/24/18 2243)     Initial Impression / Assessment and Plan / ED Course  I have reviewed the triage vital signs and the nursing notes.  Pertinent labs & imaging results that were available during my care of the patient were reviewed by me and considered in my medical decision making (see chart for  details).     Low suspicion for fracture.  Likely wound infection.  We will go ahead and start on clindamycin. Straight without evidence of bony injury.  Mild soft tissue swelling.  Strict return precautions given. Final Clinical Impressions(s) / ED Diagnoses   Final diagnoses:  Wound infection    ED Discharge Orders         Ordered    clindamycin (CLEOCIN) 300 MG capsule  4 times daily     01/24/18 2317           Julianne Rice, MD 01/24/18 2317

## 2018-01-24 NOTE — ED Notes (Signed)
Patient returned from xray.

## 2018-01-24 NOTE — ED Notes (Signed)
Patient transported to X-ray 

## 2018-01-31 ENCOUNTER — Other Ambulatory Visit (INDEPENDENT_AMBULATORY_CARE_PROVIDER_SITE_OTHER): Payer: Self-pay | Admitting: Internal Medicine

## 2018-02-01 ENCOUNTER — Telehealth: Payer: Self-pay | Admitting: Gastroenterology

## 2018-02-01 NOTE — Telephone Encounter (Signed)
Hi Dr. Lyndel Safe, we received a referral from pt's PCP to evaluate patient for abdominal pain. Patient is actually established with [facility] but wants to transfer care because they do not meet her needs. Her records are in Epic for your review. Please let me know if it is ok to schedule patient with you. Thank you.

## 2018-02-02 NOTE — Telephone Encounter (Signed)
No problems You can book her in my clinic-routine visit

## 2018-02-02 NOTE — Telephone Encounter (Signed)
Left message to call back to schedule appt with Dr. Lyndel Safe.

## 2018-02-07 DIAGNOSIS — J060 Acute laryngopharyngitis: Secondary | ICD-10-CM | POA: Diagnosis not present

## 2018-02-07 DIAGNOSIS — F410 Panic disorder [episodic paroxysmal anxiety] without agoraphobia: Secondary | ICD-10-CM | POA: Diagnosis not present

## 2018-02-07 DIAGNOSIS — R140 Abdominal distension (gaseous): Secondary | ICD-10-CM | POA: Diagnosis not present

## 2018-02-07 DIAGNOSIS — G4700 Insomnia, unspecified: Secondary | ICD-10-CM | POA: Diagnosis not present

## 2018-02-23 ENCOUNTER — Other Ambulatory Visit: Payer: Medicaid Other

## 2018-02-23 ENCOUNTER — Other Ambulatory Visit: Payer: Self-pay | Admitting: Behavioral Health

## 2018-02-23 ENCOUNTER — Other Ambulatory Visit (HOSPITAL_COMMUNITY)
Admission: RE | Admit: 2018-02-23 | Discharge: 2018-02-23 | Disposition: A | Discharge status: Home / Self Care | Payer: Medicaid Other | Source: Ambulatory Visit | Attending: Internal Medicine | Admitting: Internal Medicine

## 2018-02-23 DIAGNOSIS — Z113 Encounter for screening for infections with a predominantly sexual mode of transmission: Secondary | ICD-10-CM

## 2018-02-23 DIAGNOSIS — Z79899 Other long term (current) drug therapy: Secondary | ICD-10-CM | POA: Diagnosis not present

## 2018-02-23 DIAGNOSIS — B20 Human immunodeficiency virus [HIV] disease: Secondary | ICD-10-CM

## 2018-02-24 LAB — T-HELPER CELL (CD4) - (RCID CLINIC ONLY)
CD4 % Helper T Cell: 39 % (ref 33–55)
CD4 T CELL ABS: 750 /uL (ref 400–2700)

## 2018-02-24 LAB — URINE CYTOLOGY ANCILLARY ONLY
CHLAMYDIA, DNA PROBE: NEGATIVE
Neisseria Gonorrhea: NEGATIVE

## 2018-02-27 LAB — CBC WITH DIFFERENTIAL/PLATELET
Absolute Monocytes: 548 cells/uL (ref 200–950)
Basophils Absolute: 79 cells/uL (ref 0–200)
Basophils Relative: 1.2 %
EOS ABS: 231 {cells}/uL (ref 15–500)
Eosinophils Relative: 3.5 %
HCT: 41.1 % (ref 35.0–45.0)
Hemoglobin: 14.2 g/dL (ref 11.7–15.5)
Lymphs Abs: 1822 cells/uL (ref 850–3900)
MCH: 31.7 pg (ref 27.0–33.0)
MCHC: 34.5 g/dL (ref 32.0–36.0)
MCV: 91.7 fL (ref 80.0–100.0)
MPV: 11.6 fL (ref 7.5–12.5)
Monocytes Relative: 8.3 %
NEUTROS PCT: 59.4 %
Neutro Abs: 3920 cells/uL (ref 1500–7800)
PLATELETS: 264 10*3/uL (ref 140–400)
RBC: 4.48 10*6/uL (ref 3.80–5.10)
RDW: 13 % (ref 11.0–15.0)
TOTAL LYMPHOCYTE: 27.6 %
WBC: 6.6 10*3/uL (ref 3.8–10.8)

## 2018-02-27 LAB — COMPREHENSIVE METABOLIC PANEL
AG RATIO: 1.4 (calc) (ref 1.0–2.5)
ALBUMIN MSPROF: 4.5 g/dL (ref 3.6–5.1)
ALKALINE PHOSPHATASE (APISO): 53 U/L (ref 33–115)
ALT: 80 U/L — ABNORMAL HIGH (ref 6–29)
AST: 45 U/L — ABNORMAL HIGH (ref 10–35)
BUN: 11 mg/dL (ref 7–25)
CHLORIDE: 101 mmol/L (ref 98–110)
CO2: 27 mmol/L (ref 20–32)
Calcium: 9.3 mg/dL (ref 8.6–10.2)
Creat: 0.77 mg/dL (ref 0.50–1.10)
GLOBULIN: 3.2 g/dL (ref 1.9–3.7)
Glucose, Bld: 101 mg/dL — ABNORMAL HIGH (ref 65–99)
POTASSIUM: 4 mmol/L (ref 3.5–5.3)
Sodium: 137 mmol/L (ref 135–146)
Total Bilirubin: 0.9 mg/dL (ref 0.2–1.2)
Total Protein: 7.7 g/dL (ref 6.1–8.1)

## 2018-02-27 LAB — HIV-1 RNA QUANT-NO REFLEX-BLD
HIV 1 RNA QUANT: DETECTED {copies}/mL — AB
HIV-1 RNA QUANT, LOG: DETECTED {Log_copies}/mL — AB

## 2018-02-27 LAB — LIPID PANEL
CHOLESTEROL: 248 mg/dL — AB (ref <200)
HDL: 57 mg/dL (ref >50)
LDL Cholesterol (Calc): 159 mg/dL (calc) — ABNORMAL HIGH
NON-HDL CHOLESTEROL (CALC): 191 mg/dL — AB (ref <130)
Total CHOL/HDL Ratio: 4.4 (calc) (ref <5.0)
Triglycerides: 170 mg/dL — ABNORMAL HIGH (ref <150)

## 2018-02-27 LAB — RPR: RPR: NONREACTIVE

## 2018-02-28 ENCOUNTER — Encounter (INDEPENDENT_AMBULATORY_CARE_PROVIDER_SITE_OTHER): Payer: Self-pay | Admitting: Internal Medicine

## 2018-02-28 ENCOUNTER — Ambulatory Visit (INDEPENDENT_AMBULATORY_CARE_PROVIDER_SITE_OTHER): Payer: Medicaid Other | Admitting: Internal Medicine

## 2018-02-28 VITALS — BP 112/80 | HR 72 | Temp 98.7°F | Ht 64.0 in | Wt 206.7 lb

## 2018-02-28 DIAGNOSIS — R1902 Left upper quadrant abdominal swelling, mass and lump: Secondary | ICD-10-CM | POA: Diagnosis not present

## 2018-02-28 NOTE — Patient Instructions (Signed)
OV as needed

## 2018-02-28 NOTE — Progress Notes (Signed)
Subjective:    Patient ID: Chelsey Henson, female    DOB: 07-18-1970, 47 y.o.   MRN: [id number]  HPI  Presents today with c/o of a mass in her LUQ. Noticed for about 6 months. She says she can feel the mass. The area is not painful.  No injury. Underwent an [place] RUQ and then was sent back to look at the spleen.  Her appetite is okay. Sometimes she is nauseated. BMs are normal.  No melena or BRRB.    01/21/2018 US abdomen RUQ: abdominal distention and bloating x 6 months IMPRESSION: Probable fatty infiltration of liver as above. Small amount of sludge within gallbladder.  Otherwise negative exam. Underwent an EGD (f/u GE reflux disease). Impression:               - LA Grade A reflux esophagitis( distal esophageal                            erosions).                           - Z-line irregular, 35 cm from the incisors.                           - Normal cardia, gastric fundus, gastric body and                            pylorus.                           - Erosive gastropathy.                           - Normal duodenal bulb and second portion of the                            duodenum. H. Pylori positive. Started on Pylera and she did finish the medication.      Review of Systems Past Medical History:  Diagnosis Date  . Anemia   . Anxiety   . Asthma   . Depression   . Genital herpes   . GERD (gastroesophageal reflux disease)   . HIV (human immunodeficiency virus infection) ([place]) 11/24/2017  . HIV infection ([place]) 2008  . Hypertension   . Sleep apnea   . Substance abuse ([place])    RECOVERED 7 YEARS - COCAINE  . Vaginal Pap smear, abnormal     Past Surgical History:  Procedure Laterality Date  . ABDOMINAL HYSTERECTOMY     BENIGN MASS  . CESAREAN SECTION    . ESOPHAGOGASTRODUODENOSCOPY N/A 06/30/2017   Procedure: ESOPHAGOGASTRODUODENOSCOPY (EGD);  Surgeon: Rogene Houston, MD;  [facility];  Service: Endoscopy;  Laterality: N/A;  2:00  . HERNIA REPAIR  2017      Allergies  Allergen Reactions  . Abilify [Aripiprazole] Other (See Comments)    Tardive dyskinesia  . Codeine Itching    Current Outpatient Medications on File Prior to Visit  Medication Sig Dispense Refill  . acyclovir (ZOVIRAX) 800 MG tablet TAKE 1 TABLET EVERY DAY. (Patient taking differently: Take 800 mg by mouth at bedtime) 90 tablet 3  . albuterol (PROVENTIL HFA;VENTOLIN HFA) 108 (90 Base) MCG/ACT inhaler Inhale  2 puffs into the lungs every 4 (four) hours as needed for wheezing or shortness of breath. 1 Inhaler 3  . albuterol (PROVENTIL) (2.5 MG/3ML) 0.083% nebulizer solution Take 3 mLs (2.5 mg total) by nebulization every 6 (six) hours as needed for wheezing or shortness of breath. 75 mL 5  . escitalopram (LEXAPRO) 20 MG tablet TAKE (1) TABLET BY MOUTH ONCE DAILY. (Patient taking differently: Take 20 mg by mouth at bedtime) 90 tablet 1  . estradiol (VIVELLE-DOT) 0.05 MG/24HR patch Place 1 patch (0.05 mg total) onto the skin 2 (two) times a week. 8 patch 12  . GENVOYA 150-150-200-10 MG TABS tablet TAKE ONE TABLET BY MOUTH ONCE DAILY. 30 tablet 0  . ibuprofen (IBU) 800 MG tablet TAKE (1) TABLET BY MOUTH EVERY EIGHT HOURS AS NEEDED. (Patient taking differently: Take 800 mg by mouth every 8 (eight) hours as needed for headache or moderate pain. ) 50 tablet 3  . lisinopril-hydrochlorothiazide (PRINZIDE,ZESTORETIC) 10-12.5 MG tablet Take 1 tablet by mouth daily. 90 tablet 3  . Melatonin 1 MG CAPS Take 2 mg by mouth at bedtime.    . montelukast (SINGULAIR) 10 MG tablet Take 1 tablet (10 mg total) by mouth at bedtime. 30 tablet 3  . ondansetron (ZOFRAN ODT) 4 MG disintegrating tablet Take 1 tablet (4 mg total) by mouth every 8 (eight) hours as needed for nausea or vomiting. 20 tablet 0  . oxybutynin (DITROPAN-XL) 10 MG 24 hr tablet TAKE (1) TABLET BY MOUTH AT BEDTIME. 90 tablet 0  . traZODone (DESYREL) 100 MG tablet TAKE (1) TABLET BY MOUTH AT BEDTIME. 90 tablet 0   No current  facility-administered medications on file prior to visit.         Objective:   Physical Exam Alert and oriented. Skin warm and dry. Oral mucosa is moist.   . Sclera anicteric, conjunctivae is pink. Thyroid not enlarged. No cervical lymphadenopathy. Lungs clear. Heart regular rate and rhythm.  Abdomen is soft. Bowel sounds are positive. No hepatomegaly. No abdominal masses felt. No tenderness.  No edema to lower extremities.           Assessment & Plan:  Abdominal mass:I did not feel a mass today in office. Her abdomen is soft and obese. [place] did not reveal any masses. OV as needed.

## 2018-03-09 ENCOUNTER — Encounter: Payer: Medicaid Other | Admitting: Internal Medicine

## 2018-03-28 DIAGNOSIS — H1013 Acute atopic conjunctivitis, bilateral: Secondary | ICD-10-CM | POA: Diagnosis not present

## 2018-04-11 ENCOUNTER — Other Ambulatory Visit: Payer: Self-pay | Admitting: Internal Medicine

## 2018-04-12 ENCOUNTER — Telehealth: Payer: Self-pay

## 2018-04-12 NOTE — Telephone Encounter (Signed)
Called patient to schedule appointment for lab work/office visit. Patient was last seen by Dr. Linus Salmons on 06/2017, but had lab work done 01/2018. Patient was able to take my call and schedule appointment for lab work on 2/13. Patient states she will schedule office visit after blood draw. [place]

## 2018-04-13 ENCOUNTER — Other Ambulatory Visit: Payer: Self-pay | Admitting: Behavioral Health

## 2018-04-13 ENCOUNTER — Other Ambulatory Visit: Payer: Medicaid Other

## 2018-04-13 DIAGNOSIS — B20 Human immunodeficiency virus [HIV] disease: Secondary | ICD-10-CM | POA: Diagnosis not present

## 2018-04-13 DIAGNOSIS — Z21 Asymptomatic human immunodeficiency virus [HIV] infection status: Secondary | ICD-10-CM

## 2018-04-13 DIAGNOSIS — Z113 Encounter for screening for infections with a predominantly sexual mode of transmission: Secondary | ICD-10-CM

## 2018-04-14 LAB — T-HELPER CELL (CD4) - (RCID CLINIC ONLY)
CD4 % Helper T Cell: 25 % — ABNORMAL LOW (ref 33–55)
CD4 T Cell Abs: 590 /uL (ref 400–2700)

## 2018-04-17 ENCOUNTER — Other Ambulatory Visit: Payer: Self-pay

## 2018-04-17 DIAGNOSIS — B20 Human immunodeficiency virus [HIV] disease: Secondary | ICD-10-CM

## 2018-04-17 LAB — CBC WITH DIFFERENTIAL/PLATELET
Absolute Monocytes: 462 cells/uL (ref 200–950)
BASOS PCT: 1 %
Basophils Absolute: 68 cells/uL (ref 0–200)
Eosinophils Absolute: 88 cells/uL (ref 15–500)
Eosinophils Relative: 1.3 %
HCT: 41.3 % (ref 35.0–45.0)
Hemoglobin: 14.6 g/dL (ref 11.7–15.5)
Lymphs Abs: 2543 cells/uL (ref 850–3900)
MCH: 31.7 pg (ref 27.0–33.0)
MCHC: 35.4 g/dL (ref 32.0–36.0)
MCV: 89.8 fL (ref 80.0–100.0)
MPV: 11.8 fL (ref 7.5–12.5)
Monocytes Relative: 6.8 %
Neutro Abs: 3638 cells/uL (ref 1500–7800)
Neutrophils Relative %: 53.5 %
Platelets: 228 10*3/uL (ref 140–400)
RBC: 4.6 10*6/uL (ref 3.80–5.10)
RDW: 12.3 % (ref 11.0–15.0)
Total Lymphocyte: 37.4 %
WBC: 6.8 10*3/uL (ref 3.8–10.8)

## 2018-04-17 LAB — COMPREHENSIVE METABOLIC PANEL
AG Ratio: 1.3 (calc) (ref 1.0–2.5)
ALT: 101 U/L — ABNORMAL HIGH (ref 6–29)
AST: 54 U/L — AB (ref 10–35)
Albumin: 4.3 g/dL (ref 3.6–5.1)
Alkaline phosphatase (APISO): 47 U/L (ref 31–125)
BUN: 10 mg/dL (ref 7–25)
CO2: 28 mmol/L (ref 20–32)
Calcium: 9.5 mg/dL (ref 8.6–10.2)
Chloride: 104 mmol/L (ref 98–110)
Creat: 0.78 mg/dL (ref 0.50–1.10)
Globulin: 3.4 g/dL (calc) (ref 1.9–3.7)
Glucose, Bld: 97 mg/dL (ref 65–99)
Potassium: 4.1 mmol/L (ref 3.5–5.3)
Sodium: 140 mmol/L (ref 135–146)
Total Bilirubin: 0.6 mg/dL (ref 0.2–1.2)
Total Protein: 7.7 g/dL (ref 6.1–8.1)

## 2018-04-17 LAB — HIV-1 RNA QUANT-NO REFLEX-BLD
HIV 1 RNA Quant: 2540 copies/mL — ABNORMAL HIGH
HIV-1 RNA Quant, Log: 3.4 Log copies/mL — ABNORMAL HIGH

## 2018-04-17 NOTE — Addendum Note (Signed)
Addended by: Reggy Eye on: 04/17/2018 04:06 PM   Modules accepted: Orders

## 2018-04-22 LAB — HIV-1 GENOTYPE: HIV-1 GENOTYPE: DETECTED — AB

## 2018-04-25 DIAGNOSIS — R300 Dysuria: Secondary | ICD-10-CM | POA: Diagnosis not present

## 2018-04-26 ENCOUNTER — Encounter: Payer: Self-pay | Admitting: Internal Medicine

## 2018-04-26 ENCOUNTER — Ambulatory Visit: Payer: Medicaid Other | Admitting: Internal Medicine

## 2018-04-26 VITALS — BP 132/87 | HR 82 | Temp 98.0°F | Ht 63.0 in | Wt 205.0 lb

## 2018-04-26 DIAGNOSIS — B20 Human immunodeficiency virus [HIV] disease: Secondary | ICD-10-CM

## 2018-04-26 DIAGNOSIS — Z79899 Other long term (current) drug therapy: Secondary | ICD-10-CM | POA: Diagnosis not present

## 2018-04-26 DIAGNOSIS — K760 Fatty (change of) liver, not elsewhere classified: Secondary | ICD-10-CM

## 2018-04-26 DIAGNOSIS — R7401 Elevation of levels of liver transaminase levels: Secondary | ICD-10-CM

## 2018-04-26 DIAGNOSIS — R740 Nonspecific elevation of levels of transaminase and lactic acid dehydrogenase [LDH]: Secondary | ICD-10-CM | POA: Diagnosis not present

## 2018-04-26 DIAGNOSIS — Z23 Encounter for immunization: Secondary | ICD-10-CM

## 2018-04-26 DIAGNOSIS — F331 Major depressive disorder, recurrent, moderate: Secondary | ICD-10-CM | POA: Diagnosis not present

## 2018-04-26 DIAGNOSIS — Z21 Asymptomatic human immunodeficiency virus [HIV] infection status: Secondary | ICD-10-CM | POA: Diagnosis not present

## 2018-04-26 NOTE — Assessment & Plan Note (Signed)
Recent change in her medication due to worsening depression, now feeling better.

## 2018-04-26 NOTE — Progress Notes (Signed)
   Subjective:    Patient ID: Chelsey Henson, female    DOB: April 07, 1970, 48 y.o.   MRN: [id number]  HPI Here for follow up of HIV Has been on Genvoya and denies any missed doses.  Her viral load though was up to 2,540.  CD4 of 590.  She denies even missing a day.  Continues to have transaminitis.  No associated n/v/d.    Review of Systems  Constitutional: Negative for fatigue and unexpected weight change.  Gastrointestinal: Negative for nausea.  Skin: Negative for rash.       Objective:   Physical Exam Constitutional:      Appearance: Normal appearance.  HENT:     Mouth/Throat:     Pharynx: No posterior oropharyngeal erythema.  Eyes:     General: No scleral icterus. Cardiovascular:     Rate and Rhythm: Normal rate and regular rhythm.     Heart sounds: No murmur.  Pulmonary:     Effort: Pulmonary effort is normal.     Breath sounds: Normal breath sounds.  Skin:    Findings: No rash.  Neurological:     Mental Status: She is alert.   SH: no tobacco        Assessment & Plan:

## 2018-04-26 NOTE — Assessment & Plan Note (Signed)
Worsened viral load.  Will recheck today to see if it is suppressed again.  Will check genotype as well.  rtc 6 months unless concerns on the labs today

## 2018-04-26 NOTE — Assessment & Plan Note (Signed)
Remains stable, fatty liver on ultrasound.  Will continue to monitor.

## 2018-05-06 LAB — HIV-1 GENOTYPING (RTI,PI,IN INHBTR)
Date Viral Load Collected: 2262020
HIV-1 Genotype: DETECTED — AB

## 2018-05-06 LAB — HIV-1 RNA QUANT-NO REFLEX-BLD
HIV 1 RNA Quant: 40 copies/mL — ABNORMAL HIGH
HIV-1 RNA Quant, Log: 1.6 Log copies/mL — ABNORMAL HIGH

## 2018-05-25 DIAGNOSIS — G501 Atypical facial pain: Secondary | ICD-10-CM | POA: Diagnosis not present

## 2018-05-25 DIAGNOSIS — K122 Cellulitis and abscess of mouth: Secondary | ICD-10-CM | POA: Diagnosis not present

## 2018-06-06 ENCOUNTER — Other Ambulatory Visit: Payer: Self-pay | Admitting: Internal Medicine

## 2018-06-22 ENCOUNTER — Encounter (HOSPITAL_COMMUNITY): Payer: Self-pay

## 2018-06-22 ENCOUNTER — Other Ambulatory Visit: Payer: Self-pay

## 2018-06-22 ENCOUNTER — Emergency Department (HOSPITAL_COMMUNITY)
Admission: EM | Admit: 2018-06-22 | Discharge: 2018-06-22 | Disposition: A | Discharge status: Home / Self Care | Payer: Medicaid Other | Attending: Emergency Medicine | Admitting: Emergency Medicine

## 2018-06-22 DIAGNOSIS — J069 Acute upper respiratory infection, unspecified: Secondary | ICD-10-CM | POA: Insufficient documentation

## 2018-06-22 DIAGNOSIS — Z21 Asymptomatic human immunodeficiency virus [HIV] infection status: Secondary | ICD-10-CM | POA: Diagnosis not present

## 2018-06-22 DIAGNOSIS — J45901 Unspecified asthma with (acute) exacerbation: Secondary | ICD-10-CM | POA: Diagnosis not present

## 2018-06-22 DIAGNOSIS — R05 Cough: Secondary | ICD-10-CM | POA: Diagnosis not present

## 2018-06-22 MED ORDER — PREDNISONE 20 MG PO TABS: 40.0000 mg | ORAL_TABLET | Freq: Every day | ORAL | 0 refills | Status: DC

## 2018-06-22 NOTE — ED Notes (Signed)
Pt ambulatory to restroom

## 2018-06-22 NOTE — ED Provider Notes (Signed)
[facility] EMERGENCY DEPARTMENT Provider Note   CSN: [id number] Arrival date & time: 06/22/18  0757    History   Chief Complaint Chief Complaint  Patient presents with   Cough    HPI Chelsey Henson is a 48 y.o. female.     HPI Patient presents with shortness of breath and cough.  He said for the last around 3 days.  No real sputum production.  History of HIV.  Worried she could have covert infection.  Mild relief with her nebulizer.  States she has not been isolating all that well.  No frank fever.  No chest pain.  No abdominal pain.  History of asthma states this feels somewhat like that also. Past Medical History:  Diagnosis Date   Anemia    Anxiety    Asthma    Depression    Genital herpes    GERD (gastroesophageal reflux disease)    HIV (human immunodeficiency virus infection) ([place]) 11/24/2017   HIV infection ([place]) 2008   Hypertension    Sleep apnea    Substance abuse ([place])    RECOVERED 7 YEARS - COCAINE   Vaginal Pap smear, abnormal     Patient Active Problem List   Diagnosis Date Noted   Transaminitis 05/24/2017   Vaccine counseling 05/24/2017   Gastroesophageal reflux disease without esophagitis 04/06/2017   MDD (major depressive disorder), recurrent episode, moderate ([place]) 09/08/2016   Cocaine use disorder, moderate, in sustained remission ([place]) 09/08/2016   Abdominal pain 07/19/2016   OSA (obstructive sleep apnea) 04/15/2016   Asthma 04/15/2016   HIV (human immunodeficiency virus infection) ([place]) 04/05/2016   Essential hypertension 04/05/2016   Chronic insomnia 04/05/2016    Past Surgical History:  Procedure Laterality Date   ABDOMINAL HYSTERECTOMY     BENIGN MASS   CESAREAN SECTION     ESOPHAGOGASTRODUODENOSCOPY N/A 06/30/2017   Procedure: ESOPHAGOGASTRODUODENOSCOPY (EGD);  Surgeon: Rogene Houston, MD;  [facility];  Service: Endoscopy;  Laterality: N/A;  2:00   HERNIA REPAIR  2017     OB History    Gravida  6   Para  4   Term  4   Preterm      AB  2   Living  4     SAB  2   TAB      Ectopic      Multiple      Live Births               Home Medications    Prior to Admission medications   Medication Sig Start Date End Date Taking? Authorizing Provider  acyclovir (ZOVIRAX) 800 MG tablet TAKE 1 TABLET EVERY DAY. Patient taking differently: Take 800 mg by mouth at bedtime 11/02/16   Raylene Everts, MD  albuterol (PROVENTIL HFA;VENTOLIN HFA) 108 (90 Base) MCG/ACT inhaler Inhale 2 puffs into the lungs every 4 (four) hours as needed for wheezing or shortness of breath. 07/09/17   Noemi Chapel, MD  albuterol (PROVENTIL) (2.5 MG/3ML) 0.083% nebulizer solution Take 3 mLs (2.5 mg total) by nebulization every 6 (six) hours as needed for wheezing or shortness of breath. 07/26/17   Comer, Okey Regal, MD  escitalopram (LEXAPRO) 20 MG tablet TAKE (1) TABLET BY MOUTH ONCE DAILY. Patient taking differently: Take 20 mg by mouth at bedtime 02/03/17   Raylene Everts, MD  estradiol (VIVELLE-DOT) 0.05 MG/24HR patch Place 1 patch (0.05 mg total) onto the skin 2 (two) times a week. 10/06/17   Mallory Shirk  V, MD  GENVOYA 150-150-200-10 MG TABS tablet TAKE ONE TABLET BY MOUTH ONCE DAILY. 06/06/18   Thayer Headings, MD  ibuprofen (IBU) 800 MG tablet TAKE (1) TABLET BY MOUTH EVERY EIGHT HOURS AS NEEDED. Patient taking differently: Take 800 mg by mouth every 8 (eight) hours as needed for headache or moderate pain.  03/07/17   Raylene Everts, MD  lisinopril-hydrochlorothiazide (PRINZIDE,ZESTORETIC) 10-12.5 MG tablet Take 1 tablet by mouth daily. 11/16/16   Raylene Everts, MD  Melatonin 1 MG CAPS Take 2 mg by mouth at bedtime.    [provider]  montelukast (SINGULAIR) 10 MG tablet Take 1 tablet (10 mg total) by mouth at bedtime. 07/26/17   Thayer Headings, MD  ondansetron (ZOFRAN ODT) 4 MG disintegrating tablet Take 1 tablet (4 mg total) by mouth every 8 (eight) hours as needed for  nausea or vomiting. 11/24/17   Mesner, Corene Cornea, MD  oxybutynin (DITROPAN-XL) 10 MG 24 hr tablet TAKE (1) TABLET BY MOUTH AT BEDTIME. 06/20/17   Raylene Everts, MD  predniSONE (DELTASONE) 20 MG tablet Take 2 tablets (40 mg total) by mouth daily. 06/22/18   Davonna Belling, MD  traZODone (DESYREL) 100 MG tablet TAKE (1) TABLET BY MOUTH AT BEDTIME. 06/20/17   Raylene Everts, MD    Family History Family History  Problem Relation Age of Onset   Depression Mother    Hyperlipidemia Mother    Learning disabilities Mother    Alcohol abuse Mother 2   Alcohol abuse Father    Arthritis Father    Cancer Father    Heart disease Father    Hypertension Father    Learning disabilities Father    Alcohol abuse Sister        RECOVERED   Alcohol abuse Brother    Alcohol abuse Brother    Alcohol abuse Brother     Social History Social History   Tobacco Use   Smoking status: Never Smoker   Smokeless tobacco: Never Used  Substance Use Topics   Alcohol use: Yes    Alcohol/week: 1.0 standard drinks    Types: 1 Cans of beer per week    Comment: occ.   Drug use: No    Comment: none in 7 years     Allergies   Abilify [aripiprazole] and Codeine   Review of Systems Review of Systems  Constitutional: Positive for chills. Negative for appetite change.  HENT: Negative for congestion.   Respiratory: Positive for cough, shortness of breath and wheezing. Negative for choking.   Cardiovascular: Negative for chest pain.  Gastrointestinal: Negative for abdominal pain.  Musculoskeletal: Negative for back pain.  Skin: Negative for rash.  Neurological: Negative for weakness and numbness.  Psychiatric/Behavioral: Negative for confusion.     Physical Exam Updated Vital Signs BP 113/71 (BP Location: Left Arm)    Pulse 71    Temp 97.7 F (36.5 C) (Oral)    Resp 18    Ht 5' 4"  (1.626 m)    Wt 94.8 kg    LMP 09/30/2014 (Approximate)    SpO2 97%    BMI 35.87 kg/m   Physical  Exam Vitals signs and nursing note reviewed.  HENT:     Head: Atraumatic.  Eyes:     General: No scleral icterus. Pulmonary:     Effort: No respiratory distress.     Breath sounds: Wheezing present.     Comments: Mild diffuse wheezes.  No focal rales or rhonchi. Abdominal:  Tenderness: There is no abdominal tenderness.  Musculoskeletal:     Right lower leg: No edema.     Left lower leg: No edema.  Skin:    Capillary Refill: Capillary refill takes less than 2 seconds.  Neurological:     Mental Status: She is alert.      ED Treatments / Results  Labs (all labs ordered are listed, but only abnormal results are displayed) Labs Reviewed - No data to display  EKG None  Radiology No results found.  Procedures Procedures (including critical care time)  Medications Ordered in ED Medications - No data to display   Initial Impression / Assessment and Plan / ED Course  I have reviewed the triage vital signs and the nursing notes.  Pertinent labs & imaging results that were available during my care of the patient were reviewed by me and considered in my medical decision making (see chart for details).       Patient is likely URI and asthma.  Well-appearing.  Discussed with Dr. Johnnye Sima to see if because of the HIV she would need a higher level of testing for COVID.  States that he does not need at this time.  She is a well-appearing.  Patient is comfortable with this.  No x-ray due to reassuring non-localizing lung findings.  Discharge home.  Final Clinical Impressions(s) / ED Diagnoses   Final diagnoses:  Upper respiratory tract infection, unspecified type  Exacerbation of asthma, unspecified asthma severity, unspecified whether persistent    ED Discharge Orders         Ordered    predniSONE (DELTASONE) 20 MG tablet  Daily     06/22/18 0835           Davonna Belling, MD 06/22/18 [phone]

## 2018-06-22 NOTE — ED Triage Notes (Signed)
Pt has been having a cough and chills for the last 2-3 days. Has taken Theraflu and Nyquil with no relief. Pt is HIV positive and wants to be checked out due to immunosuppression. No fevers noted. NAD

## 2018-06-25 ENCOUNTER — Encounter (HOSPITAL_COMMUNITY): Payer: Self-pay | Admitting: Emergency Medicine

## 2018-06-25 ENCOUNTER — Emergency Department (HOSPITAL_COMMUNITY)
Admission: EM | Admit: 2018-06-25 | Discharge: 2018-06-25 | Disposition: A | Discharge status: Home / Self Care | Payer: Medicaid Other | Attending: Emergency Medicine | Admitting: Emergency Medicine

## 2018-06-25 ENCOUNTER — Other Ambulatory Visit: Payer: Self-pay

## 2018-06-25 ENCOUNTER — Emergency Department (HOSPITAL_COMMUNITY): Payer: Medicaid Other

## 2018-06-25 DIAGNOSIS — J111 Influenza due to unidentified influenza virus with other respiratory manifestations: Secondary | ICD-10-CM | POA: Diagnosis not present

## 2018-06-25 DIAGNOSIS — B20 Human immunodeficiency virus [HIV] disease: Secondary | ICD-10-CM | POA: Insufficient documentation

## 2018-06-25 DIAGNOSIS — I10 Essential (primary) hypertension: Secondary | ICD-10-CM | POA: Insufficient documentation

## 2018-06-25 DIAGNOSIS — Z20822 Contact with and (suspected) exposure to covid-19: Secondary | ICD-10-CM

## 2018-06-25 DIAGNOSIS — R112 Nausea with vomiting, unspecified: Secondary | ICD-10-CM | POA: Diagnosis not present

## 2018-06-25 DIAGNOSIS — Z20828 Contact with and (suspected) exposure to other viral communicable diseases: Secondary | ICD-10-CM | POA: Diagnosis not present

## 2018-06-25 DIAGNOSIS — Z79899 Other long term (current) drug therapy: Secondary | ICD-10-CM | POA: Diagnosis not present

## 2018-06-25 DIAGNOSIS — Z03818 Encounter for observation for suspected exposure to other biological agents ruled out: Secondary | ICD-10-CM | POA: Diagnosis not present

## 2018-06-25 DIAGNOSIS — R509 Fever, unspecified: Secondary | ICD-10-CM | POA: Diagnosis not present

## 2018-06-25 DIAGNOSIS — R0602 Shortness of breath: Secondary | ICD-10-CM | POA: Diagnosis not present

## 2018-06-25 DIAGNOSIS — IMO0001 Reserved for inherently not codable concepts without codable children: Secondary | ICD-10-CM

## 2018-06-25 DIAGNOSIS — R05 Cough: Secondary | ICD-10-CM | POA: Diagnosis not present

## 2018-06-25 DIAGNOSIS — J45909 Unspecified asthma, uncomplicated: Secondary | ICD-10-CM | POA: Diagnosis not present

## 2018-06-25 DIAGNOSIS — R6889 Other general symptoms and signs: Secondary | ICD-10-CM

## 2018-06-25 LAB — CBC WITH DIFFERENTIAL/PLATELET
Abs Immature Granulocytes: 0.08 10*3/uL — ABNORMAL HIGH (ref 0.00–0.07)
Basophils Absolute: 0 10*3/uL (ref 0.0–0.1)
Basophils Relative: 0 %
Eosinophils Absolute: 0 10*3/uL (ref 0.0–0.5)
Eosinophils Relative: 0 %
HCT: 37.6 % (ref 36.0–46.0)
Hemoglobin: 13 g/dL (ref 12.0–15.0)
Immature Granulocytes: 1 %
Lymphocytes Relative: 24 %
Lymphs Abs: 2.2 10*3/uL (ref 0.7–4.0)
MCH: 31.4 pg (ref 26.0–34.0)
MCHC: 34.6 g/dL (ref 30.0–36.0)
MCV: 90.8 fL (ref 80.0–100.0)
Monocytes Absolute: 0.4 10*3/uL (ref 0.1–1.0)
Monocytes Relative: 5 %
Neutro Abs: 6.6 10*3/uL (ref 1.7–7.7)
Neutrophils Relative %: 70 %
Platelets: 226 10*3/uL (ref 150–400)
RBC: 4.14 MIL/uL (ref 3.87–5.11)
RDW: 14 % (ref 11.5–15.5)
WBC: 9.4 10*3/uL (ref 4.0–10.5)
nRBC: 0 % (ref 0.0–0.2)

## 2018-06-25 LAB — COMPREHENSIVE METABOLIC PANEL
ALT: 87 U/L — ABNORMAL HIGH (ref 0–44)
AST: 45 U/L — ABNORMAL HIGH (ref 15–41)
Albumin: 4.1 g/dL (ref 3.5–5.0)
Alkaline Phosphatase: 47 U/L (ref 38–126)
Anion gap: 8 (ref 5–15)
BUN: 19 mg/dL (ref 6–20)
CO2: 24 mmol/L (ref 22–32)
Calcium: 9.1 mg/dL (ref 8.9–10.3)
Chloride: 106 mmol/L (ref 98–111)
Creatinine, Ser: 0.66 mg/dL (ref 0.44–1.00)
GFR calc Af Amer: >60 mL/min (ref >60)
GFR calc non Af Amer: >60 mL/min (ref >60)
Glucose, Bld: 139 mg/dL — ABNORMAL HIGH (ref 70–99)
Potassium: 4.1 mmol/L (ref 3.5–5.1)
Sodium: 138 mmol/L (ref 135–145)
Total Bilirubin: 0.5 mg/dL (ref 0.3–1.2)
Total Protein: 7.9 g/dL (ref 6.5–8.1)

## 2018-06-25 LAB — SARS CORONAVIRUS 2 BY RT PCR (HOSPITAL ORDER, PERFORMED IN ~~LOC~~ HOSPITAL LAB): SARS Coronavirus 2: NEGATIVE

## 2018-06-25 LAB — LIPASE, BLOOD: Lipase: 42 U/L (ref 11–51)

## 2018-06-25 LAB — LACTIC ACID, PLASMA: Lactic Acid, Venous: 1.4 mmol/L (ref 0.5–1.9)

## 2018-06-25 MED ORDER — ONDANSETRON HCL 4 MG/2ML IJ SOLN
4.0000 mg | Freq: Once | INTRAMUSCULAR | Status: AC
  Administered 2018-06-25: 4 mg via INTRAVENOUS
  Filled 2018-06-25: qty 2

## 2018-06-25 MED ORDER — SODIUM CHLORIDE 0.9 % IV BOLUS
1000.0000 mL | Freq: Once | INTRAVENOUS | Status: AC
  Administered 2018-06-25: 1000 mL via INTRAVENOUS

## 2018-06-25 MED ORDER — SODIUM CHLORIDE 0.9 % IV SOLN
INTRAVENOUS | Status: DC
  Administered 2018-06-25: 17:00:00 via INTRAVENOUS

## 2018-06-25 NOTE — ED Triage Notes (Signed)
Pt reports worsening SOB, cough, subjective fever, n/v, and back pain with coughing. Pt evaluated on 4/23, and symptoms began 3 days prior to that visit. Pt states she was not tested for COVID-19. Denies any known exposures. Pt with hx of asthma, states nebulizer does not work at home.

## 2018-06-25 NOTE — Discharge Instructions (Signed)
Work-up here today included negative chest x-ray no evidence of pneumonia.  Labs without significant abnormality.  COVID-19 testing was negative.  Lungs have cleared and remain clear.  Continue your current treatment plan.  Continue your follow-up with infectious disease.  Make an appointment follow-up with your doctor as needed.  Return for any new or worse symptoms.

## 2018-06-25 NOTE — ED Provider Notes (Signed)
[facility] EMERGENCY DEPARTMENT Provider Note   CSN: [id number] Arrival date & time: 06/25/18  1454    History   Chief Complaint Chief Complaint  Patient presents with  . Shortness of Breath    HPI Chelsey Henson is a 48 y.o. female.     This is a duplicate note.  The complete note is already been done.     Past Medical History:  Diagnosis Date  . Anemia   . Anxiety   . Asthma   . Depression   . Genital herpes   . GERD (gastroesophageal reflux disease)   . HIV (human immunodeficiency virus infection) ([place]) 11/24/2017  . HIV infection ([place]) 2008  . Hypertension   . Sleep apnea   . Substance abuse ([place])    RECOVERED 7 YEARS - COCAINE  . Vaginal Pap smear, abnormal     Patient Active Problem List   Diagnosis Date Noted  . Transaminitis 05/24/2017  . Vaccine counseling 05/24/2017  . Gastroesophageal reflux disease without esophagitis 04/06/2017  . MDD (major depressive disorder), recurrent episode, moderate ([place]) 09/08/2016  . Cocaine use disorder, moderate, in sustained remission ([place]) 09/08/2016  . Abdominal pain 07/19/2016  . OSA (obstructive sleep apnea) 04/15/2016  . Asthma 04/15/2016  . HIV (human immunodeficiency virus infection) ([place]) 04/05/2016  . Essential hypertension 04/05/2016  . Chronic insomnia 04/05/2016    Past Surgical History:  Procedure Laterality Date  . ABDOMINAL HYSTERECTOMY     BENIGN MASS  . CESAREAN SECTION    . ESOPHAGOGASTRODUODENOSCOPY N/A 06/30/2017   Procedure: ESOPHAGOGASTRODUODENOSCOPY (EGD);  Surgeon: Rogene Houston, MD;  Location: [facility] ENDO SUITE;  Service: Endoscopy;  Laterality: N/A;  2:00  . HERNIA REPAIR  2017     OB History    Gravida  6   Para  4   Term  4   Preterm      AB  2   Living  4     SAB  2   TAB      Ectopic      Multiple      Live Births               Home Medications    Prior to Admission medications   Medication Sig Start Date End Date Taking? Authorizing Provider   acyclovir (ZOVIRAX) 800 MG tablet TAKE 1 TABLET EVERY DAY. Patient taking differently: Take 800 mg by mouth at bedtime.  11/02/16  Yes Raylene Everts, MD  albuterol (PROVENTIL HFA;VENTOLIN HFA) 108 (90 Base) MCG/ACT inhaler Inhale 2 puffs into the lungs every 4 (four) hours as needed for wheezing or shortness of breath. 07/09/17  Yes Noemi Chapel, MD  albuterol (PROVENTIL) (2.5 MG/3ML) 0.083% nebulizer solution Take 3 mLs (2.5 mg total) by nebulization every 6 (six) hours as needed for wheezing or shortness of breath. 07/26/17  Yes Comer, Okey Regal, MD  ALPRAZolam Duanne Moron) 0.5 MG tablet Take 0.5 mg by mouth every evening.   Yes [provider]  amoxicillin (AMOXIL) 500 MG capsule Take 500 mg by mouth 3 (three) times daily.   Yes [provider]  benzonatate (TESSALON) 100 MG capsule Take 100 mg by mouth 3 (three) times daily as needed for cough.   Yes [provider]  budesonide-formoterol (SYMBICORT) 160-4.5 MCG/ACT inhaler Inhale 2 puffs into the lungs 2 (two) times daily.   Yes [provider]  escitalopram (LEXAPRO) 20 MG tablet TAKE (1) TABLET BY MOUTH ONCE DAILY. Patient taking differently: Take 20 mg by  mouth at bedtime 02/03/17  Yes Raylene Everts, MD  estradiol (ESTRACE) 1 MG tablet Take 1 mg by mouth every evening.    Yes [provider]  GENVOYA 150-150-200-10 MG TABS tablet TAKE ONE TABLET BY MOUTH ONCE DAILY. Patient taking differently: Take 1 tablet by mouth every evening.  06/06/18  Yes Comer, Okey Regal, MD  ibuprofen (IBU) 800 MG tablet TAKE (1) TABLET BY MOUTH EVERY EIGHT HOURS AS NEEDED. Patient taking differently: Take 800 mg by mouth every 8 (eight) hours as needed for headache or moderate pain.  03/07/17  Yes Raylene Everts, MD  lisinopril-hydrochlorothiazide (PRINZIDE,ZESTORETIC) 10-12.5 MG tablet Take 1 tablet by mouth daily. Patient taking differently: Take 1 tablet by mouth every evening.  11/16/16  Yes Raylene Everts, MD   Melatonin 1 MG CAPS Take 2 mg by mouth at bedtime.   Yes [provider]  montelukast (SINGULAIR) 10 MG tablet Take 1 tablet (10 mg total) by mouth at bedtime. 07/26/17  Yes Comer, Okey Regal, MD  oxybutynin (DITROPAN-XL) 10 MG 24 hr tablet TAKE (1) TABLET BY MOUTH AT BEDTIME. Patient taking differently: Take 10 mg by mouth at bedtime.  06/20/17  Yes Raylene Everts, MD  traZODone (DESYREL) 100 MG tablet TAKE (1) TABLET BY MOUTH AT BEDTIME. Patient taking differently: Take 100 mg by mouth at bedtime.  06/20/17  Yes Raylene Everts, MD  predniSONE (DELTASONE) 20 MG tablet Take 2 tablets (40 mg total) by mouth daily. Patient not taking: Reported on 06/25/2018 06/22/18   Davonna Belling, MD    Family History Family History  Problem Relation Age of Onset  . Depression Mother   . Hyperlipidemia Mother   . Learning disabilities Mother   . Alcohol abuse Mother 84  . Alcohol abuse Father   . Arthritis Father   . Cancer Father   . Heart disease Father   . Hypertension Father   . Learning disabilities Father   . Alcohol abuse Sister        RECOVERED  . Alcohol abuse Brother   . Alcohol abuse Brother   . Alcohol abuse Brother     Social History Social History   Tobacco Use  . Smoking status: Never Smoker  . Smokeless tobacco: Never Used  Substance Use Topics  . Alcohol use: Yes    Alcohol/week: 1.0 standard drinks    Types: 1 Cans of beer per week    Comment: occ.  . Drug use: No    Comment: none in 7 years     Allergies   Abilify [aripiprazole] and Codeine   Review of Systems Review of Systems   Physical Exam Updated Vital Signs BP 114/73   Pulse (!) 57   Temp 98.8 F (37.1 C) (Oral)   Resp 18   Ht 1.626 m (5' 4" )   Wt 94.8 kg   LMP 09/30/2014 (Approximate)   SpO2 96%   BMI 35.87 kg/m   Physical Exam   ED Treatments / Results  Labs (all labs ordered are listed, but only abnormal results are displayed) Labs Reviewed  CBC WITH  DIFFERENTIAL/PLATELET - Abnormal; Notable for the following components:      Result Value   Abs Immature Granulocytes 0.08 (*)    All other components within normal limits  COMPREHENSIVE METABOLIC PANEL - Abnormal; Notable for the following components:   Glucose, Bld 139 (*)    AST 45 (*)    ALT 87 (*)    All other components within  normal limits  SARS CORONAVIRUS 2 (HOSPITAL ORDER, [place] [facility])  LIPASE, BLOOD  LACTIC ACID, PLASMA    EKG None  Radiology Dg Chest Port 1 View  Result Date: 06/25/2018 CLINICAL DATA:  Progressively worsening cough, shortness of breath and nausea/vomiting which began approximately 1 week ago. EXAM: PORTABLE CHEST 1 VIEW COMPARISON:  11/24/2017 and earlier. FINDINGS: Suboptimal inspiration accounts for crowded bronchovascular markings, especially in the bases, and accentuates the cardiac silhouette. Taking this into account, cardiomediastinal silhouette unremarkable and unchanged. Lungs clear. No confluent or ground-glass airspace consolidation. No pleural effusions. Normal pulmonary vascularity. IMPRESSION: Suboptimal inspiration. No acute cardiopulmonary disease. Electronically Signed   By: Evangeline Dakin M.D.   On: 06/25/2018 16:28    Procedures Procedures (including critical care time)  Medications Ordered in ED Medications  0.9 %  sodium chloride infusion ( Intravenous New Bag/Given 06/25/18 1650)  sodium chloride 0.9 % bolus 1,000 mL (1,000 mLs Intravenous New Bag/Given 06/25/18 1649)  ondansetron (ZOFRAN) injection 4 mg (4 mg Intravenous Given 06/25/18 1648)     Initial Impression / Assessment and Plan / ED Course  I have reviewed the triage vital signs and the nursing notes.  Pertinent labs & imaging results that were available during my care of the patient were reviewed by me and considered in my medical decision making (see chart for details).        This is a duplicate note.  Complete note is already been  filed.  Final Clinical Impressions(s) / ED Diagnoses   Final diagnoses:  Flu-like symptoms  Covid-19 Virus not Detected    ED Discharge Orders    None       Fredia Sorrow, MD 06/27/18 1620

## 2018-06-25 NOTE — ED Provider Notes (Signed)
[facility] EMERGENCY DEPARTMENT Provider Note   CSN: [id number] Arrival date & time: 06/25/18  1454    History   Chief Complaint Chief Complaint  Patient presents with  . Shortness of Breath    HPI Chelsey Henson is a 48 y.o. female.     Patient seen April 23 returns today with persistent symptoms still not feeling well.  Patient states she does not feel any worse.  Patient has a history of HIV and is on antivirals.  During her visit on the 23rd patient not have chest x-ray or lab work.  There was a discussion with infectious disease and they recommended no further testing.  Patient states she still been having wheezing on and off not feeling well.  Patient states she is having worsening shortness of breath cough subjective fever nausea vomiting back pain with coughing.  Patient's vital signs temp is 98.8 heart rate 69 respiratory rate 16 oxygen saturations on room air is 98%.  Blood pressure 128/89.  Patient with no known COVID-19 exposure.  Patient states she has been taking her antivirals as directed.     Past Medical History:  Diagnosis Date  . Anemia   . Anxiety   . Asthma   . Depression   . Genital herpes   . GERD (gastroesophageal reflux disease)   . HIV (human immunodeficiency virus infection) ([place]) 11/24/2017  . HIV infection ([place]) 2008  . Hypertension   . Sleep apnea   . Substance abuse ([place])    RECOVERED 7 YEARS - COCAINE  . Vaginal Pap smear, abnormal     Patient Active Problem List   Diagnosis Date Noted  . Transaminitis 05/24/2017  . Vaccine counseling 05/24/2017  . Gastroesophageal reflux disease without esophagitis 04/06/2017  . MDD (major depressive disorder), recurrent episode, moderate ([place]) 09/08/2016  . Cocaine use disorder, moderate, in sustained remission ([place]) 09/08/2016  . Abdominal pain 07/19/2016  . OSA (obstructive sleep apnea) 04/15/2016  . Asthma 04/15/2016  . HIV (human immunodeficiency virus infection) ([place]) 04/05/2016  . Essential  hypertension 04/05/2016  . Chronic insomnia 04/05/2016    Past Surgical History:  Procedure Laterality Date  . ABDOMINAL HYSTERECTOMY     BENIGN MASS  . CESAREAN SECTION    . ESOPHAGOGASTRODUODENOSCOPY N/A 06/30/2017   Procedure: ESOPHAGOGASTRODUODENOSCOPY (EGD);  Surgeon: Rogene Houston, MD;  Location: [facility] ENDO SUITE;  Service: Endoscopy;  Laterality: N/A;  2:00  . HERNIA REPAIR  2017     OB History    Gravida  6   Para  4   Term  4   Preterm      AB  2   Living  4     SAB  2   TAB      Ectopic      Multiple      Live Births               Home Medications    Prior to Admission medications   Medication Sig Start Date End Date Taking? Authorizing Provider  acyclovir (ZOVIRAX) 800 MG tablet TAKE 1 TABLET EVERY DAY. Patient taking differently: Take 800 mg by mouth at bedtime.  11/02/16  Yes Raylene Everts, MD  albuterol (PROVENTIL HFA;VENTOLIN HFA) 108 (90 Base) MCG/ACT inhaler Inhale 2 puffs into the lungs every 4 (four) hours as needed for wheezing or shortness of breath. 07/09/17  Yes Noemi Chapel, MD  albuterol (PROVENTIL) (2.5 MG/3ML) 0.083% nebulizer solution Take 3 mLs (2.5 mg total) by nebulization every 6 (  six) hours as needed for wheezing or shortness of breath. 07/26/17  Yes Comer, Okey Regal, MD  ALPRAZolam Duanne Moron) 0.5 MG tablet Take 0.5 mg by mouth every evening.   Yes [provider]  amoxicillin (AMOXIL) 500 MG capsule Take 500 mg by mouth 3 (three) times daily.   Yes [provider]  benzonatate (TESSALON) 100 MG capsule Take 100 mg by mouth 3 (three) times daily as needed for cough.   Yes [provider]  budesonide-formoterol (SYMBICORT) 160-4.5 MCG/ACT inhaler Inhale 2 puffs into the lungs 2 (two) times daily.   Yes [provider]  escitalopram (LEXAPRO) 20 MG tablet TAKE (1) TABLET BY MOUTH ONCE DAILY. Patient taking differently: Take 20 mg by mouth at bedtime 02/03/17  Yes Raylene Everts, MD  estradiol  (ESTRACE) 1 MG tablet Take 1 mg by mouth every evening.    Yes [provider]  GENVOYA 150-150-200-10 MG TABS tablet TAKE ONE TABLET BY MOUTH ONCE DAILY. Patient taking differently: Take 1 tablet by mouth every evening.  06/06/18  Yes Comer, Okey Regal, MD  ibuprofen (IBU) 800 MG tablet TAKE (1) TABLET BY MOUTH EVERY EIGHT HOURS AS NEEDED. Patient taking differently: Take 800 mg by mouth every 8 (eight) hours as needed for headache or moderate pain.  03/07/17  Yes Raylene Everts, MD  lisinopril-hydrochlorothiazide (PRINZIDE,ZESTORETIC) 10-12.5 MG tablet Take 1 tablet by mouth daily. Patient taking differently: Take 1 tablet by mouth every evening.  11/16/16  Yes Raylene Everts, MD  Melatonin 1 MG CAPS Take 2 mg by mouth at bedtime.   Yes [provider]  montelukast (SINGULAIR) 10 MG tablet Take 1 tablet (10 mg total) by mouth at bedtime. 07/26/17  Yes Comer, Okey Regal, MD  oxybutynin (DITROPAN-XL) 10 MG 24 hr tablet TAKE (1) TABLET BY MOUTH AT BEDTIME. Patient taking differently: Take 10 mg by mouth at bedtime.  06/20/17  Yes Raylene Everts, MD  traZODone (DESYREL) 100 MG tablet TAKE (1) TABLET BY MOUTH AT BEDTIME. Patient taking differently: Take 100 mg by mouth at bedtime.  06/20/17  Yes Raylene Everts, MD  predniSONE (DELTASONE) 20 MG tablet Take 2 tablets (40 mg total) by mouth daily. Patient not taking: Reported on 06/25/2018 06/22/18   Davonna Belling, MD    Family History Family History  Problem Relation Age of Onset  . Depression Mother   . Hyperlipidemia Mother   . Learning disabilities Mother   . Alcohol abuse Mother 64  . Alcohol abuse Father   . Arthritis Father   . Cancer Father   . Heart disease Father   . Hypertension Father   . Learning disabilities Father   . Alcohol abuse Sister        RECOVERED  . Alcohol abuse Brother   . Alcohol abuse Brother   . Alcohol abuse Brother     Social History Social History   Tobacco Use  . Smoking  status: Never Smoker  . Smokeless tobacco: Never Used  Substance Use Topics  . Alcohol use: Yes    Alcohol/week: 1.0 standard drinks    Types: 1 Cans of beer per week    Comment: occ.  . Drug use: No    Comment: none in 7 years     Allergies   Abilify [aripiprazole] and Codeine   Review of Systems Review of Systems  Constitutional: Negative for chills and fever.  HENT: Negative for rhinorrhea and sore throat.   Eyes: Negative for visual disturbance.  Respiratory: Negative for cough and shortness of breath.   Cardiovascular: Negative for chest pain and leg swelling.  Gastrointestinal: Negative for abdominal pain, diarrhea, nausea and vomiting.  Genitourinary: Negative for dysuria.  Musculoskeletal: Negative for back pain and neck pain.  Skin: Negative for rash.  Neurological: Negative for dizziness, light-headedness and headaches.  Hematological: Does not bruise/bleed easily.  Psychiatric/Behavioral: Negative for confusion.     Physical Exam Updated Vital Signs BP 115/75   Pulse 60   Temp 98.9 F (37.2 C) (Oral)   Resp 17   Ht 1.626 m (5' 4" )   Wt 94.8 kg   LMP 09/30/2014 (Approximate)   SpO2 97%   BMI 35.87 kg/m   Physical Exam Vitals signs and nursing note reviewed.  Constitutional:      General: She is not in acute distress.    Appearance: Normal appearance. She is well-developed.  HENT:     Head: Normocephalic and atraumatic.  Eyes:     Extraocular Movements: Extraocular movements intact.     Conjunctiva/sclera: Conjunctivae normal.     Pupils: Pupils are equal, round, and reactive to light.  Neck:     Musculoskeletal: Normal range of motion and neck supple.  Cardiovascular:     Rate and Rhythm: Normal rate and regular rhythm.     Heart sounds: No murmur.  Pulmonary:     Effort: Pulmonary effort is normal. No respiratory distress.     Breath sounds: Wheezing present.     Comments: Mild bilateral wheezing. Abdominal:     Palpations: Abdomen is  soft.     Tenderness: There is no abdominal tenderness.  Musculoskeletal: Normal range of motion.  Skin:    General: Skin is warm and dry.  Neurological:     Mental Status: She is alert.      ED Treatments / Results  Labs (all labs ordered are listed, but only abnormal results are displayed) Labs Reviewed  CBC WITH DIFFERENTIAL/PLATELET - Abnormal; Notable for the following components:      Result Value   Abs Immature Granulocytes 0.08 (*)    All other components within normal limits  COMPREHENSIVE METABOLIC PANEL - Abnormal; Notable for the following components:   Glucose, Bld 139 (*)    AST 45 (*)    ALT 87 (*)    All other components within normal limits  SARS CORONAVIRUS 2 (HOSPITAL ORDER, [place] [facility])  LIPASE, BLOOD  LACTIC ACID, PLASMA    EKG None  Radiology Dg Chest Port 1 View  Result Date: 06/25/2018 CLINICAL DATA:  Progressively worsening cough, shortness of breath and nausea/vomiting which began approximately 1 week ago. EXAM: PORTABLE CHEST 1 VIEW COMPARISON:  11/24/2017 and earlier. FINDINGS: Suboptimal inspiration accounts for crowded bronchovascular markings, especially in the bases, and accentuates the cardiac silhouette. Taking this into account, cardiomediastinal silhouette unremarkable and unchanged. Lungs clear. No confluent or ground-glass airspace consolidation. No pleural effusions. Normal pulmonary vascularity. IMPRESSION: Suboptimal inspiration. No acute cardiopulmonary disease. Electronically Signed   By: Evangeline Dakin M.D.   On: 06/25/2018 16:28    Procedures Procedures (including critical care time)  Medications Ordered in ED Medications  0.9 %  sodium chloride infusion ( Intravenous Stopped 06/25/18 1835)  sodium chloride 0.9 % bolus 1,000 mL (0 mLs Intravenous Stopped 06/25/18 1835)  ondansetron (ZOFRAN) injection 4 mg (4 mg Intravenous Given 06/25/18 1648)     Initial Impression / Assessment and Plan / ED Course   I have reviewed the triage vital  signs and the nursing notes.  Pertinent labs & imaging results that were available during my care of the patient were reviewed by me and considered in my medical decision making (see chart for details).       Patient wheezing resolved here without any treatment.  Chest x-ray was negative.  Labs without any significant abnormality.  Normal [place] blood cell count.  COVID testing was done in anticipation of need for admission.  But it was negative.  Patient with significant improvement with IV fluids.  Patient stable for discharge home and follow-up with her doctor and infectious disease.  Final Clinical Impressions(s) / ED Diagnoses   Final diagnoses:  Flu-like symptoms  Covid-19 Virus not Detected    ED Discharge Orders    None       Fredia Sorrow, MD 06/25/18 1858

## 2018-06-26 DIAGNOSIS — F419 Anxiety disorder, unspecified: Secondary | ICD-10-CM | POA: Diagnosis not present

## 2018-07-14 DIAGNOSIS — R6884 Jaw pain: Secondary | ICD-10-CM | POA: Diagnosis not present

## 2018-07-18 DIAGNOSIS — B20 Human immunodeficiency virus [HIV] disease: Secondary | ICD-10-CM | POA: Diagnosis not present

## 2018-07-18 DIAGNOSIS — F411 Generalized anxiety disorder: Secondary | ICD-10-CM | POA: Diagnosis not present

## 2018-07-18 DIAGNOSIS — N951 Menopausal and female climacteric states: Secondary | ICD-10-CM | POA: Diagnosis not present

## 2018-07-18 DIAGNOSIS — J4520 Mild intermittent asthma, uncomplicated: Secondary | ICD-10-CM | POA: Diagnosis not present

## 2018-07-18 DIAGNOSIS — E782 Mixed hyperlipidemia: Secondary | ICD-10-CM | POA: Diagnosis not present

## 2018-07-18 DIAGNOSIS — F410 Panic disorder [episodic paroxysmal anxiety] without agoraphobia: Secondary | ICD-10-CM | POA: Diagnosis not present

## 2018-07-18 DIAGNOSIS — N3281 Overactive bladder: Secondary | ICD-10-CM | POA: Diagnosis not present

## 2018-07-18 DIAGNOSIS — G4700 Insomnia, unspecified: Secondary | ICD-10-CM | POA: Diagnosis not present

## 2018-07-18 DIAGNOSIS — K760 Fatty (change of) liver, not elsewhere classified: Secondary | ICD-10-CM | POA: Diagnosis not present

## 2018-07-18 DIAGNOSIS — I10 Essential (primary) hypertension: Secondary | ICD-10-CM | POA: Diagnosis not present

## 2018-07-18 DIAGNOSIS — K219 Gastro-esophageal reflux disease without esophagitis: Secondary | ICD-10-CM | POA: Diagnosis not present

## 2018-08-02 ENCOUNTER — Other Ambulatory Visit (HOSPITAL_COMMUNITY): Payer: Self-pay | Admitting: Internal Medicine

## 2018-08-02 DIAGNOSIS — Z1231 Encounter for screening mammogram for malignant neoplasm of breast: Secondary | ICD-10-CM

## 2018-08-07 ENCOUNTER — Ambulatory Visit (HOSPITAL_COMMUNITY): Payer: Medicaid Other

## 2018-08-28 ENCOUNTER — Other Ambulatory Visit: Payer: Self-pay | Admitting: Internal Medicine

## 2018-09-05 ENCOUNTER — Ambulatory Visit: Payer: Medicaid Other | Admitting: Adult Health

## 2018-09-08 DIAGNOSIS — F411 Generalized anxiety disorder: Secondary | ICD-10-CM | POA: Diagnosis not present

## 2018-09-25 ENCOUNTER — Ambulatory Visit
Admission: EM | Admit: 2018-09-25 | Discharge: 2018-09-25 | Disposition: A | Discharge status: Home / Self Care | Payer: Medicaid Other | Attending: Emergency Medicine | Admitting: Emergency Medicine

## 2018-09-25 ENCOUNTER — Other Ambulatory Visit: Payer: Self-pay

## 2018-09-25 DIAGNOSIS — K0889 Other specified disorders of teeth and supporting structures: Secondary | ICD-10-CM

## 2018-09-25 DIAGNOSIS — K047 Periapical abscess without sinus: Secondary | ICD-10-CM | POA: Diagnosis not present

## 2018-09-25 MED ORDER — AMOXICILLIN-POT CLAVULANATE 875-125 MG PO TABS: 1.0000 | ORAL_TABLET | Freq: Two times a day (BID) | ORAL | 0 refills | Status: AC

## 2018-09-25 MED ORDER — KETOROLAC TROMETHAMINE 60 MG/2ML IM SOLN
60.0000 mg | Freq: Once | INTRAMUSCULAR | Status: AC
  Administered 2018-09-25: 11:00:00 60 mg via INTRAMUSCULAR

## 2018-09-25 NOTE — Discharge Instructions (Signed)
Toradol shot given in office Augmenin prescribed for possible infection.  Take as directed and to completion Continue to alternate ibuprofen and tylenol extra strength as needed for pain Recommend soft diet until evaluated by dentist Maintain oral hygiene care Follow up with dentist as soon as possible for further evaluation and treatment  Return or go to the ED if you have any new or worsening symptoms such as fever, chills, difficulty swallowing, painful swallowing, oral or neck swelling, nausea, vomiting, chest pain, SOB, etc..Marland Kitchen

## 2018-09-25 NOTE — ED Provider Notes (Signed)
[place]   [id number] 09/25/18 Arrival Time: 4010  CC: DENTAL PAIN  SUBJECTIVE:  Chelsey Henson is a 48 y.o. female who presents with dental pain x 2-3 days ago.  Denies a precipitating event or trauma.  Localizes pain to left lower side.  Describes as constant and throbbing.  Has tried OTC analgesics without relief.  Worse with chewing.  Had tooth removed from that side of the mouth 5 days ago.  Reports similar symptoms in the past.  Denies fever, chills, dysphagia, odynophagia, oral or neck swelling, nausea, vomiting, chest pain, SOB.    ROS: As per HPI.  All other pertinent ROS negative.     Past Medical History:  Diagnosis Date  . Anemia   . Anxiety   . Asthma   . Depression   . Genital herpes   . GERD (gastroesophageal reflux disease)   . HIV (human immunodeficiency virus infection) ([place]) 11/24/2017  . HIV infection ([place]) 2008  . Hypertension   . Sleep apnea   . 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 Vaginal Pap smear, abnormal    Past Surgical History:  Procedure Laterality Date  . ABDOMINAL HYSTERECTOMY     BENIGN MASS  . CESAREAN SECTION    . ESOPHAGOGASTRODUODENOSCOPY N/A 06/30/2017   Procedure: ESOPHAGOGASTRODUODENOSCOPY (EGD);  Surgeon: Rogene Houston, MD;  Location: [facility];  Service: Endoscopy;  Laterality: N/A;  2:00  . HERNIA REPAIR  2017   Allergies  Allergen Reactions  . Abilify [Aripiprazole] Other (See Comments)    Tardive dyskinesia  . Codeine Itching   No current facility-administered medications on file prior to encounter.    Current Outpatient Medications on File Prior to Encounter  Medication Sig Dispense Refill  . acyclovir (ZOVIRAX) 800 MG tablet TAKE 1 TABLET EVERY DAY. (Patient taking differently: Take 800 mg by mouth at bedtime. ) 90 tablet 3  . albuterol (PROVENTIL HFA;VENTOLIN HFA) 108 (90 Base) MCG/ACT inhaler Inhale 2 puffs into the lungs every 4 (four) hours as needed for wheezing or shortness of  breath. 1 Inhaler 3  . albuterol (PROVENTIL) (2.5 MG/3ML) 0.083% nebulizer solution USE 1 VIAL IN NEBULIZER EVERY 6 HOURS AS NEEDED FOR SHORTNESS OF BREATH OR WHEEZING. 75 mL 0  . ALPRAZolam (XANAX) 0.5 MG tablet Take 0.5 mg by mouth every evening.    . benzonatate (TESSALON) 100 MG capsule Take 100 mg by mouth 3 (three) times daily as needed for cough.    . escitalopram (LEXAPRO) 20 MG tablet TAKE (1) TABLET BY MOUTH ONCE DAILY. (Patient taking differently: Take 20 mg by mouth at bedtime) 90 tablet 1  . estradiol (ESTRACE) 1 MG tablet Take 1 mg by mouth every evening.     Marland Kitchen GENVOYA 150-150-200-10 MG TABS tablet TAKE ONE TABLET BY MOUTH ONCE DAILY. (Patient taking differently: Take 1 tablet by mouth every evening. ) 30 tablet 5  . lisinopril-hydrochlorothiazide (PRINZIDE,ZESTORETIC) 10-12.5 MG tablet Take 1 tablet by mouth daily. (Patient taking differently: Take 1 tablet by mouth every evening. ) 90 tablet 3  . Melatonin 1 MG CAPS Take 2 mg by mouth at bedtime.    Marland Kitchen oxybutynin (DITROPAN-XL) 10 MG 24 hr tablet TAKE (1) TABLET BY MOUTH AT BEDTIME. (Patient taking differently: Take 10 mg by mouth at bedtime. ) 90 tablet 0  . traZODone (DESYREL) 100 MG tablet TAKE (1) TABLET BY MOUTH AT BEDTIME. (Patient taking differently: Take 100 mg by mouth at bedtime. ) 90 tablet 0  . [  DISCONTINUED] budesonide-formoterol (SYMBICORT) 160-4.5 MCG/ACT inhaler Inhale 2 puffs into the lungs 2 (two) times daily.    . [DISCONTINUED] montelukast (SINGULAIR) 10 MG tablet Take 1 tablet (10 mg total) by mouth at bedtime. 30 tablet 3   Social History   Socioeconomic History  . Marital status: Married    Spouse name: Legrand Como  . Number of children: 4  . Years of education: 8  . Highest education level: Not on file  Occupational History  . 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 Financial resource strain: Not on file  . Food insecurity    Worry: Not on file    Inability: Not on file  . Transportation  needs    Medical: Not on file    Non-medical: Not on file  Tobacco Use  . Smoking status: Never Smoker  . Smokeless tobacco: Never Used  Substance and Sexual Activity  . Alcohol use: Yes    Alcohol/week: 1.0 standard drinks    Types: 1 Cans of beer per week    Comment: occ.  . Drug use: No    Comment: none in 7 years  . Sexual activity: Not Currently    Birth control/protection: Surgical  Lifestyle  . Physical activity    Days per week: Not on file    Minutes per session: Not on file  . Stress: Not on file  Relationships  . Social Herbalist on phone: Not on file    Gets together: Not on file    Attends religious service: Not on file    Active member of club or organization: Not on file    Attends meetings of clubs or organizations: Not on file    Relationship status: Not on file  . Intimate partner violence    Fear of current or ex partner: Not on file    Emotionally abused: Not on file    Physically abused: Not on file    Forced sexual activity: Not on file  Other Topics Concern  . 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 Depression Mother   . Hyperlipidemia Mother   . Learning disabilities Mother   . Alcohol abuse Mother 9  . Alcohol abuse Father   . Arthritis Father   . Cancer Father   . Heart disease Father   . Hypertension Father   . Learning disabilities Father   . Alcohol abuse Sister        RECOVERED  . Alcohol abuse Brother   . Alcohol abuse Brother   . Alcohol abuse Brother     OBJECTIVE:  Vitals:   09/25/18 1047  BP: 110/69  Pulse: 72  Resp: 18  Temp: 98.5 F (36.9 C)  SpO2: 95%    General appearance: alert; no distress HENT: normocephalic; atraumatic; dentition: poor; poor over left lower gums without  areas of fluctuance Neck: supple without LAD Lungs: normal respirations; CTAB CV: RRR; radial pulses 2+; cap refill < 2 secs Skin: warm and dry Psychological: alert and cooperative; normal mood and affect  ASSESSMENT & PLAN:  1. Pain, dental   2. Dental infection     Meds ordered this encounter  Medications  . amoxicillin-clavulanate (  AUGMENTIN) 875-125 MG tablet    Sig: Take 1 tablet by mouth every 12 (twelve) hours for 10 days.    Dispense:  20 tablet    Refill:  0    Order Specific Question:   Supervising Provider    Answer:   Raylene Everts [[id number]]  . ketorolac (TORADOL) injection 60 mg    Toradol shot given in office Augmentin prescribed for possible infection.  Take as directed and to completion Continue to alternate ibuprofen and tylenol extra strength as needed for pain Recommend soft diet until evaluated by dentist Maintain oral hygiene care Follow up with dentist as soon as possible for further evaluation and treatment  Return or go to the ED if you have any new or worsening symptoms such as fever, chills, difficulty swallowing, painful swallowing, oral or neck swelling, nausea, vomiting, chest pain, SOB, etc...  Reviewed expectations re: course of current medical issues. Questions answered. Outlined signs and symptoms indicating need for more acute intervention. Patient verbalized understanding. After Visit Summary given.   Lestine Box, PA-C 09/25/18 1113

## 2018-09-25 NOTE — ED Triage Notes (Signed)
Pt presents with complaints of tooth pain in one of her left lower teeth. States she had dental work done 5 days ago and the pain has gotten worse. Concerned for abscess. Patient states dentist cannot get her in for over a week.

## 2018-09-26 DIAGNOSIS — K1379 Other lesions of oral mucosa: Secondary | ICD-10-CM | POA: Diagnosis not present

## 2018-09-26 DIAGNOSIS — F331 Major depressive disorder, recurrent, moderate: Secondary | ICD-10-CM | POA: Diagnosis not present

## 2018-09-26 DIAGNOSIS — K0889 Other specified disorders of teeth and supporting structures: Secondary | ICD-10-CM | POA: Diagnosis not present

## 2018-10-10 DIAGNOSIS — F331 Major depressive disorder, recurrent, moderate: Secondary | ICD-10-CM | POA: Diagnosis not present

## 2018-10-10 DIAGNOSIS — F411 Generalized anxiety disorder: Secondary | ICD-10-CM | POA: Diagnosis not present

## 2018-10-13 ENCOUNTER — Encounter: Payer: Self-pay | Admitting: Internal Medicine

## 2018-10-13 DIAGNOSIS — B20 Human immunodeficiency virus [HIV] disease: Secondary | ICD-10-CM | POA: Diagnosis not present

## 2018-10-19 DIAGNOSIS — B20 Human immunodeficiency virus [HIV] disease: Secondary | ICD-10-CM | POA: Diagnosis not present

## 2018-10-19 DIAGNOSIS — B370 Candidal stomatitis: Secondary | ICD-10-CM | POA: Diagnosis not present

## 2018-10-19 DIAGNOSIS — F331 Major depressive disorder, recurrent, moderate: Secondary | ICD-10-CM | POA: Diagnosis not present

## 2018-10-19 DIAGNOSIS — F411 Generalized anxiety disorder: Secondary | ICD-10-CM | POA: Diagnosis not present

## 2018-10-19 DIAGNOSIS — L292 Pruritus vulvae: Secondary | ICD-10-CM | POA: Diagnosis not present

## 2018-10-19 DIAGNOSIS — F5101 Primary insomnia: Secondary | ICD-10-CM | POA: Diagnosis not present

## 2018-10-23 DIAGNOSIS — F5101 Primary insomnia: Secondary | ICD-10-CM | POA: Diagnosis not present

## 2018-10-23 DIAGNOSIS — B20 Human immunodeficiency virus [HIV] disease: Secondary | ICD-10-CM | POA: Diagnosis not present

## 2018-10-23 DIAGNOSIS — N760 Acute vaginitis: Secondary | ICD-10-CM | POA: Diagnosis not present

## 2018-10-23 DIAGNOSIS — F331 Major depressive disorder, recurrent, moderate: Secondary | ICD-10-CM | POA: Diagnosis not present

## 2018-10-23 DIAGNOSIS — B379 Candidiasis, unspecified: Secondary | ICD-10-CM | POA: Diagnosis not present

## 2018-10-23 DIAGNOSIS — G4700 Insomnia, unspecified: Secondary | ICD-10-CM | POA: Diagnosis not present

## 2018-10-23 DIAGNOSIS — F410 Panic disorder [episodic paroxysmal anxiety] without agoraphobia: Secondary | ICD-10-CM | POA: Diagnosis not present

## 2018-10-23 DIAGNOSIS — F419 Anxiety disorder, unspecified: Secondary | ICD-10-CM | POA: Diagnosis not present

## 2018-10-23 DIAGNOSIS — E782 Mixed hyperlipidemia: Secondary | ICD-10-CM | POA: Diagnosis not present

## 2018-10-23 DIAGNOSIS — F411 Generalized anxiety disorder: Secondary | ICD-10-CM | POA: Diagnosis not present

## 2018-10-23 DIAGNOSIS — G501 Atypical facial pain: Secondary | ICD-10-CM | POA: Diagnosis not present

## 2018-10-23 DIAGNOSIS — B370 Candidal stomatitis: Secondary | ICD-10-CM | POA: Diagnosis not present

## 2018-10-23 DIAGNOSIS — I10 Essential (primary) hypertension: Secondary | ICD-10-CM | POA: Diagnosis not present

## 2018-10-24 ENCOUNTER — Telehealth: Payer: Self-pay | Admitting: Internal Medicine

## 2018-10-24 NOTE — Telephone Encounter (Signed)
COVID-19 Pre-Screening Questions:  Do you currently have a fever (>100 F), chills or unexplained body aches? N  Are you currently experiencing new cough, shortness of breath, sore throat, runny nose? N   Have you recently travelled outside the state of [place] in the last 14 days? N    Have you been in contact with someone that is currently pending confirmation of Covid19 testing or has been confirmed to have the [place] virus?  N  **If the patient answers NO to ALL questions -  advise the patient to please call the clinic before coming to the office should any symptoms develop.

## 2018-10-25 ENCOUNTER — Encounter: Payer: Self-pay | Admitting: Internal Medicine

## 2018-10-25 ENCOUNTER — Ambulatory Visit (INDEPENDENT_AMBULATORY_CARE_PROVIDER_SITE_OTHER): Payer: Medicaid Other | Admitting: Internal Medicine

## 2018-10-25 ENCOUNTER — Other Ambulatory Visit: Payer: Self-pay

## 2018-10-25 VITALS — BP 114/81 | HR 81 | Temp 99.3°F

## 2018-10-25 DIAGNOSIS — R740 Nonspecific elevation of levels of transaminase and lactic acid dehydrogenase [LDH]: Secondary | ICD-10-CM

## 2018-10-25 DIAGNOSIS — R7401 Elevation of levels of liver transaminase levels: Secondary | ICD-10-CM

## 2018-10-25 DIAGNOSIS — I10 Essential (primary) hypertension: Secondary | ICD-10-CM | POA: Diagnosis not present

## 2018-10-25 DIAGNOSIS — Z21 Asymptomatic human immunodeficiency virus [HIV] infection status: Secondary | ICD-10-CM

## 2018-10-26 ENCOUNTER — Telehealth: Payer: Self-pay | Admitting: Obstetrics & Gynecology

## 2018-10-26 ENCOUNTER — Ambulatory Visit (INDEPENDENT_AMBULATORY_CARE_PROVIDER_SITE_OTHER): Payer: Medicaid Other | Admitting: Obstetrics & Gynecology

## 2018-10-26 ENCOUNTER — Encounter: Payer: Self-pay | Admitting: Obstetrics & Gynecology

## 2018-10-26 VITALS — BP 125/87 | HR 81 | Ht 64.0 in | Wt 198.0 lb

## 2018-10-26 DIAGNOSIS — R1030 Lower abdominal pain, unspecified: Secondary | ICD-10-CM | POA: Diagnosis not present

## 2018-10-26 DIAGNOSIS — B3731 Acute candidiasis of vulva and vagina: Secondary | ICD-10-CM

## 2018-10-26 DIAGNOSIS — B373 Candidiasis of vulva and vagina: Secondary | ICD-10-CM

## 2018-10-26 MED ORDER — ESTRADIOL 0.1 MG/GM VA CREA: TOPICAL_CREAM | VAGINAL | 12 refills | Status: DC

## 2018-10-26 MED ORDER — FLUCONAZOLE 100 MG PO TABS: 100.0000 mg | ORAL_TABLET | Freq: Every day | ORAL | 0 refills | Status: DC

## 2018-10-26 NOTE — Progress Notes (Signed)
Chief Complaint  Patient presents with  . Pelvic Pain      48 y.o. [id number] Patient's last menstrual period was 09/30/2014 (approximate). The current method of family planning is status post hysterectomy.  Outpatient Encounter Medications as of 10/26/2018  Medication Sig Note  . acyclovir (ZOVIRAX) 800 MG tablet TAKE 1 TABLET EVERY DAY. (Patient taking differently: Take 800 mg by mouth at bedtime. )   . albuterol (PROVENTIL HFA;VENTOLIN HFA) 108 (90 Base) MCG/ACT inhaler Inhale 2 puffs into the lungs every 4 (four) hours as needed for wheezing or shortness of breath.   Marland Kitchen albuterol (PROVENTIL) (2.5 MG/3ML) 0.083% nebulizer solution USE 1 VIAL IN NEBULIZER EVERY 6 HOURS AS NEEDED FOR SHORTNESS OF BREATH OR WHEEZING.   . ALPRAZolam (XANAX) 0.5 MG tablet Take 0.5 mg by mouth 3 (three) times daily.  06/25/2018: Refill needed  . benzonatate (TESSALON) 100 MG capsule Take 100 mg by mouth 3 (three) times daily as needed for cough.   . escitalopram (LEXAPRO) 20 MG tablet TAKE (1) TABLET BY MOUTH ONCE DAILY. (Patient taking differently: Take 20 mg by mouth at bedtime)   . estradiol (ESTRACE) 1 MG tablet Take 1 mg by mouth every evening.    Marland Kitchen GENVOYA 150-150-200-10 MG TABS tablet TAKE ONE TABLET BY MOUTH ONCE DAILY. (Patient taking differently: Take 1 tablet by mouth every evening. )   . lisinopril-hydrochlorothiazide (PRINZIDE,ZESTORETIC) 10-12.5 MG tablet Take 1 tablet by mouth daily. (Patient taking differently: Take 1 tablet by mouth every evening. )   . Melatonin 1 MG CAPS Take 2 mg by mouth at bedtime.   Marland Kitchen oxybutynin (DITROPAN-XL) 10 MG 24 hr tablet TAKE (1) TABLET BY MOUTH AT BEDTIME. (Patient taking differently: Take 10 mg by mouth at bedtime. )   . zolpidem (AMBIEN) 10 MG tablet Take 10 mg by mouth at bedtime as needed for sleep.   Marland Kitchen estradiol (ESTRACE) 0.1 MG/GM vaginal cream Use 1 grams per vagina nightly   . fluconazole (DIFLUCAN) 100 MG tablet Take 1 tablet (100 mg total) by mouth  daily.   . [DISCONTINUED] budesonide-formoterol (SYMBICORT) 160-4.5 MCG/ACT inhaler Inhale 2 puffs into the lungs 2 (two) times daily.   . [DISCONTINUED] montelukast (SINGULAIR) 10 MG tablet Take 1 tablet (10 mg total) by mouth at bedtime.   . [DISCONTINUED] traZODone (DESYREL) 100 MG tablet TAKE (1) TABLET BY MOUTH AT BEDTIME. (Patient not taking: Reported on 10/25/2018)    No facility-administered encounter medications on file as of 10/26/2018.     Subjective Pt is s/p abdominal hysterectomy and subsequently removal of both ovaries who is seen at the request of Dr Josue Hector office for vaginal burning and worsening lower abdominal pain.  Pt states she has had lower abdominal pain to varying degrees for years, she had "triple hernia" surgery with mesh placement 2018 in [place].  Since then she has had some degree of lower abdominal pain, although worsening the last 2-3 months or so.  No nausea vomiting assoicated no change in bowel movement, no febrile episodes  Also with some vaginal burning symptoms and some ongoing menopausal vasomotor symptoms despite estradiol 1 mg daily Past Medical History:  Diagnosis Date  . Anemia   . Anxiety   . Asthma   . Depression   . Genital herpes   . GERD (gastroesophageal reflux disease)   . HIV (human immunodeficiency virus infection) ([place]) 11/24/2017  . HIV infection ([place]) 2008  . Hypertension   . Sleep apnea   . Substance  abuse ([place])    RECOVERED 7 YEARS - COCAINE  . Vaginal Pap smear, abnormal     Past Surgical History:  Procedure Laterality Date  . ABDOMINAL HYSTERECTOMY     BENIGN MASS  . CESAREAN SECTION    . ESOPHAGOGASTRODUODENOSCOPY N/A 06/30/2017   Procedure: ESOPHAGOGASTRODUODENOSCOPY (EGD);  Surgeon: Rogene Houston, MD;  Location[facility];  Service: Endoscopy;  Laterality: N/A;  2:00  . HERNIA REPAIR  2017    OB History    Gravida  6   Para  4   Term  4   Preterm      AB  2   Living  4     SAB  2   TAB       Ectopic      Multiple      Live Births              Allergies  Allergen Reactions  . Abilify [Aripiprazole] Other (See Comments)    Tardive dyskinesia  . Codeine Itching    Social History   Socioeconomic History  . Marital status: Married    Spouse name: Legrand Como  . Number of children: 4  . Years of education: 8  . Highest education level: Not on file  Occupational History  . 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 Financial resource strain: Not on file  . Food insecurity    Worry: Not on file    Inability: Not on file  . Transportation needs    Medical: Not on file    Non-medical: Not on file  Tobacco Use  . Smoking status: Never Smoker  . Smokeless tobacco: Never Used  Substance and Sexual Activity  . Alcohol use: Yes    Alcohol/week: 1.0 standard drinks    Types: 1 Cans of beer per week    Comment: occ.  . Drug use: No    Comment: none in 7 years  . Sexual activity: Not Currently    Birth control/protection: Surgical  Lifestyle  . Physical activity    Days per week: Not on file    Minutes per session: Not on file  . Stress: Not on file  Relationships  . Social Herbalist on phone: Not on file    Gets together: Not on file    Attends religious service: Not on file    Active member of club or organization: Not on file    Attends meetings of clubs or organizations: Not on file    Relationship status: Not on file  Other Topics Concern  . 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 Depression Mother   . Hyperlipidemia Mother   . Learning disabilities Mother   . Alcohol abuse Mother 77  . Alcohol abuse Father   . Arthritis Father   . Cancer Father   . Heart disease Father   . Hypertension Father    . Learning disabilities Father   . Alcohol abuse Sister        RECOVERED  . Alcohol abuse Brother   . Alcohol abuse Brother   . Alcohol abuse Brother     Medications:  Current Outpatient Medications:  .  acyclovir (ZOVIRAX) 800 MG tablet, TAKE 1 TABLET EVERY DAY. (Patient taking differently: Take 800 mg by mouth at bedtime. ), Disp: 90 tablet, Rfl: 3 .  albuterol (PROVENTIL HFA;VENTOLIN HFA) 108 (90 Base) MCG/ACT inhaler, Inhale 2 puffs into the lungs every 4 (four) hours as needed for wheezing or shortness of breath., Disp: 1 Inhaler, Rfl: 3 .  albuterol (PROVENTIL) (2.5 MG/3ML) 0.083% nebulizer solution, USE 1 VIAL IN NEBULIZER EVERY 6 HOURS AS NEEDED FOR SHORTNESS OF BREATH OR WHEEZING., Disp: 75 mL, Rfl: 0 .  ALPRAZolam (XANAX) 0.5 MG tablet, Take 0.5 mg by mouth 3 (three) times daily. , Disp: , Rfl:  .  benzonatate (TESSALON) 100 MG capsule, Take 100 mg by mouth 3 (three) times daily as needed for cough., Disp: , Rfl:  .  escitalopram (LEXAPRO) 20 MG tablet, TAKE (1) TABLET BY MOUTH ONCE DAILY. (Patient taking differently: Take 20 mg by mouth at bedtime), Disp: 90 tablet, Rfl: 1 .  estradiol (ESTRACE) 1 MG tablet, Take 1 mg by mouth every evening. , Disp: , Rfl:  .  GENVOYA 150-150-200-10 MG TABS tablet, TAKE ONE TABLET BY MOUTH ONCE DAILY. (Patient taking differently: Take 1 tablet by mouth every evening. ), Disp: 30 tablet, Rfl: 5 .  lisinopril-hydrochlorothiazide (PRINZIDE,ZESTORETIC) 10-12.5 MG tablet, Take 1 tablet by mouth daily. (Patient taking differently: Take 1 tablet by mouth every evening. ), Disp: 90 tablet, Rfl: 3 .  Melatonin 1 MG CAPS, Take 2 mg by mouth at bedtime., Disp: , Rfl:  .  oxybutynin (DITROPAN-XL) 10 MG 24 hr tablet, TAKE (1) TABLET BY MOUTH AT BEDTIME. (Patient taking differently: Take 10 mg by mouth at bedtime. ), Disp: 90 tablet, Rfl: 0 .  zolpidem (AMBIEN) 10 MG tablet, Take 10 mg by mouth at bedtime as needed for sleep., Disp: , Rfl:  .  estradiol  (ESTRACE) 0.1 MG/GM vaginal cream, Use 1 grams per vagina nightly, Disp: 30 g, Rfl: 12 .  fluconazole (DIFLUCAN) 100 MG tablet, Take 1 tablet (100 mg total) by mouth daily., Disp: 7 tablet, Rfl: 0  Objective Blood pressure 125/87, pulse 81, height 5' 4"  (1.626 m), weight 198 lb (89.8 kg), last menstrual period 09/30/2014.  General WDWN female NAD Vulva:  normal appearing vulva with no masses, tenderness or lesions Vagina:  Erythema with some discharge present  Cervix:  absent Uterus:  absent Adnexa: ovaries: absent no adnexal masses   Pertinent ROS No burning with urination, frequency or urgency No nausea, vomiting or diarrhea Nor fever chills or other constitutional symptoms   Labs or studies No new    Impression Diagnoses this Encounter::   ICD-10-CM   1. Yeast vaginitis  B37.3   2. Lower abdominal pain  R10.30    History of hernia repair with mesh 2018    Established relevant diagnosis(es):   Plan/Recommendations: Meds ordered this encounter  Medications  . fluconazole (DIFLUCAN) 100 MG tablet    Sig: Take 1 tablet (100 mg total) by mouth daily.    Dispense:  7 tablet    Refill:  0  . estradiol (ESTRACE) 0.1 MG/GM vaginal cream    Sig: Use 1 grams per vagina nightly    Dispense:  30 g    Refill:  12    Labs or Scans Ordered: No orders of the defined types were placed in this encounter.   Management:: >Gentian violet for yeast + diflucan for a week, has some atrophic changes as well, so will prescribe  vaginal estrace as well >lower abdominal pain does not appear to be an GYN source, possibly related to her hernia surgery which she states was triple hernia surgery requiring mesh, performed in [place]     Follow up Return if symptoms worsen or fail to improve.      All questions were answered.

## 2018-10-26 NOTE — Telephone Encounter (Signed)
Pt states that she would like today's OV note sent to her PCP but wants to make sure that the mention of seeing a surgeon is in the note. Please advise.

## 2018-10-27 ENCOUNTER — Telehealth: Payer: Self-pay | Admitting: Obstetrics & Gynecology

## 2018-10-27 DIAGNOSIS — Z113 Encounter for screening for infections with a predominantly sexual mode of transmission: Secondary | ICD-10-CM | POA: Insufficient documentation

## 2018-10-27 DIAGNOSIS — Z7689 Persons encountering health services in other specified circumstances: Secondary | ICD-10-CM | POA: Insufficient documentation

## 2018-10-27 MED ORDER — ESTROGENS, CONJUGATED 0.625 MG/GM VA CREA: 1.0000 | TOPICAL_CREAM | Freq: Every day | VAGINAL | 11 refills | Status: DC

## 2018-10-27 NOTE — Assessment & Plan Note (Addendum)
Doing well.  Labs from [facility] good and she can return in 1 year unless concerns.  Prescription given for her labs for next year as she gets them at [facility].

## 2018-10-27 NOTE — Telephone Encounter (Signed)
A copy of my notes are always sent to PCP if they have one, it was faxed to Dr Nevada Crane automatically

## 2018-10-27 NOTE — Assessment & Plan Note (Deleted)
Still up. I will recheck hepatitis C Ab next visit. Likely fatty liver that was noted on a previous ultrasound.

## 2018-10-27 NOTE — Progress Notes (Signed)
   Subjective:    Patient ID: Chelsey Henson, female    DOB: [birth year], 48 y.o.   MRN: [id number]  HPI Here for follow up of HIV Her last CD4 was 590 and suppressed virus to 40 copies.  Intially it had been elevated at 2,540 but I rechecked it and it was suppressed.  She previously had been suppressed each time so likely an artifact.  She had denied any missed doses.  No labs prior to today's visit.  She has not complaints including no associated side effects.    Review of Systems  Constitutional: Negative for fatigue and unexpected weight change.  Gastrointestinal: Negative for nausea.  Skin: Negative for rash.       Objective:   Physical Exam Constitutional:      Appearance: Normal appearance.  HENT:     Mouth/Throat:     Pharynx: No posterior oropharyngeal erythema.  Eyes:     General: No scleral icterus. Cardiovascular:     Rate and Rhythm: Normal rate and regular rhythm.     Heart sounds: No murmur.  Pulmonary:     Effort: Pulmonary effort is normal.     Breath sounds: Normal breath sounds.  Skin:    Findings: No rash.  Neurological:     Mental Status: She is alert.   SH: no tobacco        Assessment & Plan:

## 2018-10-27 NOTE — Assessment & Plan Note (Signed)
Good BP today

## 2018-11-01 ENCOUNTER — Other Ambulatory Visit: Payer: Self-pay | Admitting: Internal Medicine

## 2018-11-03 DIAGNOSIS — F411 Generalized anxiety disorder: Secondary | ICD-10-CM | POA: Diagnosis not present

## 2018-11-03 DIAGNOSIS — F4310 Post-traumatic stress disorder, unspecified: Secondary | ICD-10-CM | POA: Diagnosis not present

## 2018-11-03 DIAGNOSIS — R1030 Lower abdominal pain, unspecified: Secondary | ICD-10-CM | POA: Diagnosis not present

## 2018-11-03 DIAGNOSIS — F331 Major depressive disorder, recurrent, moderate: Secondary | ICD-10-CM | POA: Diagnosis not present

## 2018-11-14 DIAGNOSIS — Z23 Encounter for immunization: Secondary | ICD-10-CM | POA: Diagnosis not present

## 2018-11-15 DIAGNOSIS — F331 Major depressive disorder, recurrent, moderate: Secondary | ICD-10-CM | POA: Diagnosis not present

## 2018-11-15 DIAGNOSIS — F411 Generalized anxiety disorder: Secondary | ICD-10-CM | POA: Diagnosis not present

## 2018-11-15 DIAGNOSIS — F4310 Post-traumatic stress disorder, unspecified: Secondary | ICD-10-CM | POA: Diagnosis not present

## 2018-11-16 DIAGNOSIS — F419 Anxiety disorder, unspecified: Secondary | ICD-10-CM | POA: Diagnosis not present

## 2018-11-16 DIAGNOSIS — Z0189 Encounter for other specified special examinations: Secondary | ICD-10-CM | POA: Diagnosis not present

## 2018-11-17 DIAGNOSIS — F330 Major depressive disorder, recurrent, mild: Secondary | ICD-10-CM | POA: Diagnosis not present

## 2018-11-21 DIAGNOSIS — N3281 Overactive bladder: Secondary | ICD-10-CM | POA: Diagnosis not present

## 2018-11-21 DIAGNOSIS — F4310 Post-traumatic stress disorder, unspecified: Secondary | ICD-10-CM | POA: Diagnosis not present

## 2018-11-21 DIAGNOSIS — F411 Generalized anxiety disorder: Secondary | ICD-10-CM | POA: Diagnosis not present

## 2018-11-21 DIAGNOSIS — F419 Anxiety disorder, unspecified: Secondary | ICD-10-CM | POA: Diagnosis not present

## 2018-11-21 DIAGNOSIS — F331 Major depressive disorder, recurrent, moderate: Secondary | ICD-10-CM | POA: Diagnosis not present

## 2018-11-21 DIAGNOSIS — N393 Stress incontinence (female) (male): Secondary | ICD-10-CM | POA: Diagnosis not present

## 2018-11-23 DIAGNOSIS — F419 Anxiety disorder, unspecified: Secondary | ICD-10-CM | POA: Diagnosis not present

## 2018-11-24 DIAGNOSIS — F419 Anxiety disorder, unspecified: Secondary | ICD-10-CM | POA: Diagnosis not present

## 2018-11-24 DIAGNOSIS — F329 Major depressive disorder, single episode, unspecified: Secondary | ICD-10-CM | POA: Diagnosis not present

## 2018-11-27 DIAGNOSIS — E782 Mixed hyperlipidemia: Secondary | ICD-10-CM | POA: Diagnosis not present

## 2018-11-27 DIAGNOSIS — I10 Essential (primary) hypertension: Secondary | ICD-10-CM | POA: Diagnosis not present

## 2018-11-27 DIAGNOSIS — R7301 Impaired fasting glucose: Secondary | ICD-10-CM | POA: Diagnosis not present

## 2018-11-30 DIAGNOSIS — F4325 Adjustment disorder with mixed disturbance of emotions and conduct: Secondary | ICD-10-CM | POA: Diagnosis not present

## 2018-12-05 DIAGNOSIS — F411 Generalized anxiety disorder: Secondary | ICD-10-CM | POA: Diagnosis not present

## 2018-12-05 DIAGNOSIS — K760 Fatty (change of) liver, not elsewhere classified: Secondary | ICD-10-CM | POA: Diagnosis not present

## 2018-12-05 DIAGNOSIS — F410 Panic disorder [episodic paroxysmal anxiety] without agoraphobia: Secondary | ICD-10-CM | POA: Diagnosis not present

## 2018-12-05 DIAGNOSIS — G4700 Insomnia, unspecified: Secondary | ICD-10-CM | POA: Diagnosis not present

## 2018-12-05 DIAGNOSIS — N3281 Overactive bladder: Secondary | ICD-10-CM | POA: Diagnosis not present

## 2018-12-05 DIAGNOSIS — N951 Menopausal and female climacteric states: Secondary | ICD-10-CM | POA: Diagnosis not present

## 2018-12-05 DIAGNOSIS — K219 Gastro-esophageal reflux disease without esophagitis: Secondary | ICD-10-CM | POA: Diagnosis not present

## 2018-12-05 DIAGNOSIS — E782 Mixed hyperlipidemia: Secondary | ICD-10-CM | POA: Diagnosis not present

## 2018-12-05 DIAGNOSIS — B20 Human immunodeficiency virus [HIV] disease: Secondary | ICD-10-CM | POA: Diagnosis not present

## 2018-12-05 DIAGNOSIS — J4520 Mild intermittent asthma, uncomplicated: Secondary | ICD-10-CM | POA: Diagnosis not present

## 2018-12-05 DIAGNOSIS — I10 Essential (primary) hypertension: Secondary | ICD-10-CM | POA: Diagnosis not present

## 2018-12-08 DIAGNOSIS — F902 Attention-deficit hyperactivity disorder, combined type: Secondary | ICD-10-CM | POA: Diagnosis not present

## 2018-12-12 DIAGNOSIS — R202 Paresthesia of skin: Secondary | ICD-10-CM | POA: Diagnosis not present

## 2018-12-12 DIAGNOSIS — K219 Gastro-esophageal reflux disease without esophagitis: Secondary | ICD-10-CM | POA: Diagnosis not present

## 2018-12-12 DIAGNOSIS — G9009 Other idiopathic peripheral autonomic neuropathy: Secondary | ICD-10-CM | POA: Diagnosis not present

## 2018-12-12 DIAGNOSIS — G4700 Insomnia, unspecified: Secondary | ICD-10-CM | POA: Diagnosis not present

## 2018-12-12 DIAGNOSIS — I10 Essential (primary) hypertension: Secondary | ICD-10-CM | POA: Diagnosis not present

## 2018-12-12 DIAGNOSIS — N393 Stress incontinence (female) (male): Secondary | ICD-10-CM | POA: Diagnosis not present

## 2018-12-12 DIAGNOSIS — B379 Candidiasis, unspecified: Secondary | ICD-10-CM | POA: Diagnosis not present

## 2018-12-12 DIAGNOSIS — J4520 Mild intermittent asthma, uncomplicated: Secondary | ICD-10-CM | POA: Diagnosis not present

## 2018-12-12 DIAGNOSIS — F339 Major depressive disorder, recurrent, unspecified: Secondary | ICD-10-CM | POA: Diagnosis not present

## 2018-12-12 DIAGNOSIS — N3281 Overactive bladder: Secondary | ICD-10-CM | POA: Diagnosis not present

## 2018-12-12 DIAGNOSIS — F419 Anxiety disorder, unspecified: Secondary | ICD-10-CM | POA: Diagnosis not present

## 2018-12-12 DIAGNOSIS — F4310 Post-traumatic stress disorder, unspecified: Secondary | ICD-10-CM | POA: Diagnosis not present

## 2018-12-12 DIAGNOSIS — R1030 Lower abdominal pain, unspecified: Secondary | ICD-10-CM | POA: Diagnosis not present

## 2018-12-12 DIAGNOSIS — B20 Human immunodeficiency virus [HIV] disease: Secondary | ICD-10-CM | POA: Diagnosis not present

## 2018-12-12 DIAGNOSIS — R3981 Functional urinary incontinence: Secondary | ICD-10-CM | POA: Diagnosis not present

## 2018-12-12 DIAGNOSIS — N760 Acute vaginitis: Secondary | ICD-10-CM | POA: Diagnosis not present

## 2018-12-13 DIAGNOSIS — N393 Stress incontinence (female) (male): Secondary | ICD-10-CM | POA: Diagnosis not present

## 2018-12-13 DIAGNOSIS — R32 Unspecified urinary incontinence: Secondary | ICD-10-CM | POA: Diagnosis not present

## 2018-12-19 DIAGNOSIS — M5431 Sciatica, right side: Secondary | ICD-10-CM | POA: Diagnosis not present

## 2018-12-19 DIAGNOSIS — R1030 Lower abdominal pain, unspecified: Secondary | ICD-10-CM | POA: Diagnosis not present

## 2018-12-19 DIAGNOSIS — G4700 Insomnia, unspecified: Secondary | ICD-10-CM | POA: Diagnosis not present

## 2018-12-19 DIAGNOSIS — G9009 Other idiopathic peripheral autonomic neuropathy: Secondary | ICD-10-CM | POA: Diagnosis not present

## 2018-12-19 DIAGNOSIS — F419 Anxiety disorder, unspecified: Secondary | ICD-10-CM | POA: Diagnosis not present

## 2018-12-19 DIAGNOSIS — F4310 Post-traumatic stress disorder, unspecified: Secondary | ICD-10-CM | POA: Diagnosis not present

## 2018-12-21 DIAGNOSIS — F419 Anxiety disorder, unspecified: Secondary | ICD-10-CM | POA: Diagnosis not present

## 2018-12-25 DIAGNOSIS — R1030 Lower abdominal pain, unspecified: Secondary | ICD-10-CM | POA: Diagnosis not present

## 2018-12-25 DIAGNOSIS — M5431 Sciatica, right side: Secondary | ICD-10-CM | POA: Diagnosis not present

## 2019-01-03 DIAGNOSIS — R32 Unspecified urinary incontinence: Secondary | ICD-10-CM | POA: Diagnosis not present

## 2019-01-03 DIAGNOSIS — N393 Stress incontinence (female) (male): Secondary | ICD-10-CM | POA: Diagnosis not present

## 2019-01-04 DIAGNOSIS — F332 Major depressive disorder, recurrent severe without psychotic features: Secondary | ICD-10-CM | POA: Diagnosis not present

## 2019-01-08 DIAGNOSIS — R1084 Generalized abdominal pain: Secondary | ICD-10-CM | POA: Diagnosis not present

## 2019-01-11 ENCOUNTER — Other Ambulatory Visit (HOSPITAL_COMMUNITY): Payer: Self-pay | Admitting: Internal Medicine

## 2019-01-11 ENCOUNTER — Other Ambulatory Visit: Payer: Self-pay | Admitting: Internal Medicine

## 2019-01-11 DIAGNOSIS — R109 Unspecified abdominal pain: Secondary | ICD-10-CM

## 2019-01-15 ENCOUNTER — Other Ambulatory Visit (HOSPITAL_COMMUNITY): Payer: Self-pay | Admitting: Internal Medicine

## 2019-01-15 DIAGNOSIS — R109 Unspecified abdominal pain: Secondary | ICD-10-CM

## 2019-01-17 ENCOUNTER — Ambulatory Visit (HOSPITAL_COMMUNITY): Payer: Medicaid Other

## 2019-01-23 ENCOUNTER — Ambulatory Visit (HOSPITAL_COMMUNITY): Payer: Medicaid Other

## 2019-01-23 ENCOUNTER — Ambulatory Visit (HOSPITAL_COMMUNITY)
Admission: RE | Admit: 2019-01-23 | Discharge: 2019-01-23 | Disposition: A | Discharge status: Home / Self Care | Payer: Medicaid Other | Source: Ambulatory Visit | Attending: Internal Medicine | Admitting: Internal Medicine

## 2019-01-23 ENCOUNTER — Other Ambulatory Visit: Payer: Self-pay

## 2019-01-23 DIAGNOSIS — R102 Pelvic and perineal pain: Secondary | ICD-10-CM | POA: Diagnosis not present

## 2019-01-23 DIAGNOSIS — R109 Unspecified abdominal pain: Secondary | ICD-10-CM | POA: Diagnosis not present

## 2019-02-02 DIAGNOSIS — N393 Stress incontinence (female) (male): Secondary | ICD-10-CM | POA: Diagnosis not present

## 2019-02-02 DIAGNOSIS — R32 Unspecified urinary incontinence: Secondary | ICD-10-CM | POA: Diagnosis not present

## 2019-02-05 DIAGNOSIS — R3915 Urgency of urination: Secondary | ICD-10-CM | POA: Diagnosis not present

## 2019-02-05 DIAGNOSIS — R102 Pelvic and perineal pain: Secondary | ICD-10-CM | POA: Diagnosis not present

## 2019-02-05 DIAGNOSIS — R350 Frequency of micturition: Secondary | ICD-10-CM | POA: Diagnosis not present

## 2019-02-05 DIAGNOSIS — R351 Nocturia: Secondary | ICD-10-CM | POA: Diagnosis not present

## 2019-02-07 ENCOUNTER — Other Ambulatory Visit: Payer: Self-pay

## 2019-02-07 DIAGNOSIS — Z20828 Contact with and (suspected) exposure to other viral communicable diseases: Secondary | ICD-10-CM | POA: Diagnosis not present

## 2019-02-07 DIAGNOSIS — Z20822 Contact with and (suspected) exposure to covid-19: Secondary | ICD-10-CM

## 2019-02-08 LAB — NOVEL CORONAVIRUS, NAA: SARS-CoV-2, NAA: DETECTED — AB

## 2019-02-09 ENCOUNTER — Telehealth: Payer: Self-pay | Admitting: Nurse Practitioner

## 2019-02-09 NOTE — Telephone Encounter (Signed)
Called to Discuss with patient about Covid symptoms and the use of bamlanivimab, a monoclonal antibody infusion for those with mild to moderate Covid symptoms and at a high risk of hospitalization.     Pt is qualified for this infusion at the [facility] infusion center due to co-morbid conditions and/or a member of an at-risk group.    Patient is managed for the following:  Patient Active Problem List   Diagnosis Date Noted  . Routine screening for STI (sexually transmitted infection) 10/27/2018  . Transaminitis 05/24/2017  . Gastroesophageal reflux disease without esophagitis 04/06/2017  . MDD (major depressive disorder), recurrent episode, moderate ([place]) 09/08/2016  . Cocaine use disorder, moderate, in sustained remission ([place]) 09/08/2016  . Abdominal pain 07/19/2016  . OSA (obstructive sleep apnea) 04/15/2016  . Asthma 04/15/2016  . HIV (human immunodeficiency virus infection) ([place]) 04/05/2016  . Essential hypertension 04/05/2016  . Chronic insomnia 04/05/2016      Unable to reach pt

## 2019-02-12 ENCOUNTER — Ambulatory Visit: Payer: Medicaid Other | Admitting: Physical Therapy

## 2019-02-12 ENCOUNTER — Telehealth: Payer: Self-pay

## 2019-02-12 NOTE — Telephone Encounter (Signed)
Patient wanted to call and make Dr. Linus Salmons aware that she tested positive for covid. She has already followed up with her PCP. Patient did not have any questions/concerns. Eugenia Mcalpine

## 2019-02-21 DIAGNOSIS — B9721 SARS-associated coronavirus as the cause of diseases classified elsewhere: Secondary | ICD-10-CM | POA: Diagnosis not present

## 2019-03-06 DIAGNOSIS — R32 Unspecified urinary incontinence: Secondary | ICD-10-CM | POA: Diagnosis not present

## 2019-03-06 DIAGNOSIS — N393 Stress incontinence (female) (male): Secondary | ICD-10-CM | POA: Diagnosis not present

## 2019-03-14 DIAGNOSIS — F419 Anxiety disorder, unspecified: Secondary | ICD-10-CM | POA: Diagnosis not present

## 2019-03-14 DIAGNOSIS — F331 Major depressive disorder, recurrent, moderate: Secondary | ICD-10-CM | POA: Diagnosis not present

## 2019-04-02 DIAGNOSIS — R32 Unspecified urinary incontinence: Secondary | ICD-10-CM | POA: Diagnosis not present

## 2019-04-02 DIAGNOSIS — N393 Stress incontinence (female) (male): Secondary | ICD-10-CM | POA: Diagnosis not present

## 2019-04-10 ENCOUNTER — Ambulatory Visit: Payer: Medicaid Other

## 2019-04-10 ENCOUNTER — Other Ambulatory Visit: Payer: Self-pay

## 2019-04-10 ENCOUNTER — Encounter: Payer: Self-pay | Admitting: Orthopaedic Surgery

## 2019-04-10 ENCOUNTER — Ambulatory Visit: Payer: Medicaid Other | Admitting: Orthopaedic Surgery

## 2019-04-10 VITALS — BP 121/80 | HR 78 | Temp 97.9°F | Ht 64.0 in | Wt 205.0 lb

## 2019-04-10 DIAGNOSIS — M25551 Pain in right hip: Secondary | ICD-10-CM

## 2019-04-10 DIAGNOSIS — G8929 Other chronic pain: Secondary | ICD-10-CM

## 2019-04-10 DIAGNOSIS — M25561 Pain in right knee: Secondary | ICD-10-CM

## 2019-04-10 MED ORDER — NAPROXEN 500 MG PO TABS: 500.0000 mg | ORAL_TABLET | Freq: Two times a day (BID) | ORAL | 5 refills | Status: DC

## 2019-04-10 NOTE — Progress Notes (Signed)
Subjective:    Patient ID: Chelsey Henson, female    DOB: [birth year], 49 y.o.   MRN: [id number]  HPI She has right knee pain and right hip pain.  The right knee gives way, swells and pops.  It is not getting any bother.  Both has bothered her on and off for ten years, getting worse over the last half year or so.  She has pain walking over 200 to 300 feet.  She has no numbness. She has pain more in the right hip with bending or trying to rotate her right leg.  She has no redness, no weakness.  She has chronic back pain.  I have reviewed independently her prior X-rays of the back done 07-27-2016 and notes from [facility] 10-26-2018.  She has not seen anyone recently for the knee or hip.  She has tried Advil, ice, heat, rubs with little help.   Review of Systems  Constitutional: Positive for activity change.  Respiratory: Positive for shortness of breath.   Musculoskeletal: Positive for arthralgias, back pain, gait problem, joint swelling and myalgias.  Psychiatric/Behavioral: The patient is nervous/anxious.   All other systems reviewed and are negative.  For Review of Systems, all other systems reviewed and are negative.  The following is a summary of the past history medically, past history surgically, known current medicines, social history and family history.  This information is gathered electronically by the computer from prior information and documentation.  I review this each visit and have found including this information at this point in the chart is beneficial and informative.   Past Medical History:  Diagnosis Date  . Anemia   . Anxiety   . Asthma   . Depression   . Genital herpes   . GERD (gastroesophageal reflux disease)   . HIV (human immunodeficiency virus infection) ([place]) 11/24/2017  . HIV infection ([place]) 2008  . Hypertension   . Sleep apnea   . 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 Vaginal Pap smear, abnormal     Past Surgical History:    Procedure Laterality Date  . ABDOMINAL HYSTERECTOMY     BENIGN MASS  . CESAREAN SECTION    . ESOPHAGOGASTRODUODENOSCOPY N/A 06/30/2017   Procedure: ESOPHAGOGASTRODUODENOSCOPY (EGD);  Surgeon: Rogene Houston, MD;  [facility];  Service: Endoscopy;  Laterality: N/A;  2:00  . HERNIA REPAIR  2017    Current Outpatient Medications on File Prior to Visit  Medication Sig Dispense Refill  . acyclovir (ZOVIRAX) 800 MG tablet TAKE 1 TABLET EVERY DAY. (Patient taking differently: Take 800 mg by mouth at bedtime. ) 90 tablet 3  . albuterol (PROVENTIL HFA;VENTOLIN HFA) 108 (90 Base) MCG/ACT inhaler Inhale 2 puffs into the lungs every 4 (four) hours as needed for wheezing or shortness of breath. 1 Inhaler 3  . albuterol (PROVENTIL) (2.5 MG/3ML) 0.083% nebulizer solution USE 1 VIAL IN NEBULIZER EVERY 6 HOURS AS NEEDED FOR SHORTNESS OF BREATH OR WHEEZING. 75 mL 0  . ALPRAZolam (XANAX) 0.5 MG tablet Take 0.5 mg by mouth 3 (three) times daily.     . benzonatate (TESSALON) 100 MG capsule Take 100 mg by mouth 3 (three) times daily as needed for cough.    . conjugated estrogens (PREMARIN) vaginal cream Place 1 Applicatorful vaginally daily. (Patient not taking: Reported on 04/10/2019) 30 g 11  . escitalopram (LEXAPRO) 20 MG tablet TAKE (1) TABLET BY MOUTH ONCE DAILY. (Patient taking differently: Take 20 mg  by mouth at bedtime) 90 tablet 1  . estradiol (ESTRACE) 0.1 MG/GM vaginal cream Use 1 grams per vagina nightly (Patient not taking: Reported on 04/10/2019) 30 g 12  . estradiol (ESTRACE) 1 MG tablet Take 1 mg by mouth every evening.     . fluconazole (DIFLUCAN) 100 MG tablet Take 1 tablet (100 mg total) by mouth daily. 7 tablet 0  . GENVOYA 150-150-200-10 MG TABS tablet TAKE ONE TABLET BY MOUTH ONCE DAILY. 30 tablet 5  . lisinopril-hydrochlorothiazide (PRINZIDE,ZESTORETIC) 10-12.5 MG tablet Take 1 tablet by mouth daily. (Patient taking differently: Take 1 tablet by mouth every evening. ) 90 tablet 3  .  Melatonin 1 MG CAPS Take 2 mg by mouth at bedtime.    Marland Kitchen oxybutynin (DITROPAN-XL) 10 MG 24 hr tablet TAKE (1) TABLET BY MOUTH AT BEDTIME. (Patient taking differently: Take 10 mg by mouth at bedtime. ) 90 tablet 0  . zolpidem (AMBIEN) 10 MG tablet Take 10 mg by mouth at bedtime as needed for sleep.    . [DISCONTINUED] budesonide-formoterol (SYMBICORT) 160-4.5 MCG/ACT inhaler Inhale 2 puffs into the lungs 2 (two) times daily.    . [DISCONTINUED] montelukast (SINGULAIR) 10 MG tablet Take 1 tablet (10 mg total) by mouth at bedtime. 30 tablet 3   No current facility-administered medications on file prior to visit.    Social History   Socioeconomic History  . Marital status: Married    Spouse name: Legrand Como  . Number of children: 4  . Years of education: 8  . Highest education level: Not on file  Occupational History  . 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 Smoking status: Never Smoker  . Smokeless tobacco: Never Used  Substance and Sexual Activity  . Alcohol use: Yes    Alcohol/week: 1.0 standard drinks    Types: 1 Cans of beer per week    Comment: occ.  . Drug use: No    Comment: none in 7 years  . Sexual activity: Not Currently    Birth control/protection: Surgical  Other Topics Concern  . 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 Difficulty of Paying Living Expenses: Not on file  Food Insecurity:   . Worried About Charity fundraiser in the Last Year: Not on file  . Ran Out of Food in the Last Year: Not on file  Transportation Needs:   . Lack of Transportation (Medical): Not on file  . Lack of Transportation (Non-Medical): Not on file  Physical Activity:   . Days of Exercise per Week: Not on file  . Minutes of Exercise  per Session: Not on file  Stress:   . Feeling of Stress : Not on file  Social Connections:   . Frequency of Communication with Friends and Family: Not on file  . Frequency of Social Gatherings with Friends and Family: Not on file  . Attends Religious Services: Not on file  . Active Member of Clubs or Organizations: Not on file  . Attends Archivist Meetings: Not on file  . Marital Status: Not on file  Intimate Partner Violence:   . Fear of Current or Ex-Partner: Not on file  . Emotionally Abused:  Not on file  . Physically Abused: Not on file  . Sexually Abused: Not on file    Family History  Problem Relation Age of Onset  . Depression Mother   . Hyperlipidemia Mother   . Learning disabilities Mother   . Alcohol abuse Mother 62  . Alcohol abuse Father   . Arthritis Father   . Cancer Father   . Heart disease Father   . Hypertension Father   . Learning disabilities Father   . Alcohol abuse Sister        RECOVERED  . Alcohol abuse Brother   . Alcohol abuse Brother   . Alcohol abuse Brother     BP 121/80   Pulse 78   Temp 97.9 F (36.6 C)   Ht 5' 4"  (1.626 m)   Wt 205 lb (93 kg)   LMP 09/30/2014 (Approximate)   BMI 35.19 kg/m   Body mass index is 35.19 kg/m.      Objective:   Physical Exam Vitals and nursing note reviewed.  Constitutional:      Appearance: She is well-developed.  HENT:     Head: Normocephalic and atraumatic.  Eyes:     Conjunctiva/sclera: Conjunctivae normal.     Pupils: Pupils are equal, round, and reactive to light.  Cardiovascular:     Rate and Rhythm: Normal rate and regular rhythm.  Pulmonary:     Effort: Pulmonary effort is normal.  Abdominal:     Palpations: Abdomen is soft.  Musculoskeletal:     Cervical back: Normal range of motion and neck supple.       Legs:  Skin:    General: Skin is warm and dry.  Neurological:     Mental Status: She is alert and oriented to person, place, and time.     Cranial Nerves: No  cranial nerve deficit.     Motor: No abnormal muscle tone.     Coordination: Coordination normal.     Deep Tendon Reflexes: Reflexes are normal and symmetric. Reflexes normal.  Psychiatric:        Behavior: Behavior normal.        Thought Content: Thought content normal.        Judgment: Judgment normal.     X-rays were done of the right hip and right knee, reported each separately.      Assessment & Plan:   Encounter Diagnoses  Name Primary?  . Chronic pain of right knee Yes  . Pain in right hip    I have told her about the arthritis of the right hip and the osteophyte.  I have recommended observation for that.  PROCEDURE NOTE:  The patient requests injections of the right knee , verbal consent was obtained.  The right knee was prepped appropriately after time out was performed.   Sterile technique was observed and injection of 1 cc of Depo-Medrol 40 mg with several cc's of plain xylocaine. Anesthesia was provided by ethyl chloride and a 20-gauge needle was used to inject the knee area. The injection was tolerated well.  A band aid dressing was applied.  The patient was advised to apply ice later today and tomorrow to the injection sight as needed.  She may need MRI of the right knee.  I am concerned about a meniscus tear.  Begin Naprosyn 500 po bid pc.  Stop if it upsets her stomach.  Return in one month.  She has to wait for at least six weeks prior to obtaining a MRI secondary  to insurance requirements.  She understands.  Call if any problem.  Precautions discussed.   Electronically Signed Sanjuana Kava, MD 2/9/20214:09 PM

## 2019-04-12 ENCOUNTER — Other Ambulatory Visit: Payer: Self-pay

## 2019-04-12 ENCOUNTER — Ambulatory Visit: Payer: Medicaid Other | Attending: Urology | Admitting: Physical Therapy

## 2019-04-12 ENCOUNTER — Encounter: Payer: Self-pay | Admitting: Physical Therapy

## 2019-04-12 DIAGNOSIS — R252 Cramp and spasm: Secondary | ICD-10-CM

## 2019-04-12 DIAGNOSIS — R279 Unspecified lack of coordination: Secondary | ICD-10-CM | POA: Diagnosis not present

## 2019-04-12 DIAGNOSIS — M6281 Muscle weakness (generalized): Secondary | ICD-10-CM | POA: Diagnosis not present

## 2019-04-12 NOTE — Therapy (Signed)
[facility] [address] [place], [place], [address] Phone: [phone]   Fax:  [phone]  Physical Therapy Evaluation  Patient Details  Name: Chelsey Henson MRN: [id number] Date of Birth: Mar 30, 1970 Referring Provider (PT): Robley Fries, MD   Encounter Date: 04/12/2019  PT End of Session - 04/12/19 1245    Visit Number  1    Date for PT Re-Evaluation  06/07/19    PT Start Time  1230    PT Stop Time  1305    PT Time Calculation (min)  35 min    Activity Tolerance  Patient tolerated treatment well    Behavior During Therapy  [facility] for tasks assessed/performed       Past Medical History:  Diagnosis Date  . Anemia   . Anxiety   . Asthma   . Depression   . Genital herpes   . GERD (gastroesophageal reflux disease)   . HIV (human immunodeficiency virus infection) ([place]) 11/24/2017  . HIV infection ([place]) 2008  . Hypertension   . Sleep apnea   . 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 Vaginal Pap smear, abnormal     Past Surgical History:  Procedure Laterality Date  . ABDOMINAL HYSTERECTOMY     BENIGN MASS  . CESAREAN SECTION    . ESOPHAGOGASTRODUODENOSCOPY N/A 06/30/2017   Procedure: ESOPHAGOGASTRODUODENOSCOPY (EGD);  Surgeon: Rogene Houston, MD;  [facility];  Service: Endoscopy;  Laterality: N/A;  2:00  . HERNIA REPAIR  2017    There were no vitals filed for this visit.   Subjective Assessment - 04/12/19 1230    Subjective  Pt states she is having pain when she has to pee.  She states the pain has been there since hysterectomy in 2010 and over time has gotten worse.  Pt drinks at leaast 3-4 16oz bottles.  Pt states too much ice tea hurts when she voids.    Patient Stated Goals  get rid of the pain down to 2/10    Currently in Pain?  Yes   with full bladder   Pain Score  7     Pain Location  Bladder    Pain Orientation  Mid    Pain Descriptors / Indicators   Dull;Throbbing    Pain Type  Chronic pain    Pain Onset  More than a month ago    Pain Frequency  Intermittent    Aggravating Factors   when bladder is full    Pain Relieving Factors  Motrin, heat    Effect of Pain on Daily Activities  No    Multiple Pain Sites  No         [facility] PT Assessment - 04/12/19 0001      Assessment   Medical Diagnosis  R10.2 (ICD-10-CM) - Pelvic and perineal pain    Referring Provider (PT)  Robley Fries, MD      Precautions   Precautions  None      Restrictions   Weight Bearing Restrictions  No      Balance Screen   Has the patient fallen in the past 6 months  No      [place] residence    Living Arrangements  Parent;Children   son and daughter; mother and father     Prior Function   Level of Independence  Independent    Leisure  walking - want to  do more      Cognition   Overall Cognitive Status  Within Functional Limits for tasks assessed      Posture/Postural Control   Posture/Postural Control  Postural limitations    Postural Limitations  Rounded Shoulders;Increased thoracic kyphosis;Anterior pelvic tilt;Increased lumbar lordosis      ROM / Strength   AROM / PROM / Strength  PROM;AROM;Strength      AROM   Overall AROM Comments  lumbar 50% flexion      PROM   PROM Assessment Site  Hip    Right/Left Hip  Right;Left    Right Hip Flexion  90    Right Hip External Rotation   45    Right Hip Internal Rotation   10    Left Hip Flexion  95    Left Hip External Rotation   45    Left Hip Internal Rotation   15      Flexibility   Soft Tissue Assessment /Muscle Length  yes    Hamstrings  Rt 40 deg; Lt 45 deg      Palpation   Palpation comment  lumbar paraspinals tight; TTP lower abdomen TTP; fascial tension in c-section scar      Ambulation/Gait   Gait Pattern  Within Functional Limits                Objective measurements completed on examination: See above findings.     Pelvic Floor Special Questions - 04/12/19 0001    Prior Pelvic/Prostate Exam  Yes    Prior Pregnancies  Yes    Number of Pregnancies  4    Number of C-Sections  1    Number of Vaginal Deliveries  3    Currently Sexually Active  No    Urinary Leakage  Yes    How often  every day    Pad use  2-3 diapers    Activities that cause leaking  With strong urge;Coughing;Sneezing;Laughing    Urinary frequency  2 hours at least    Fecal incontinence  No    Perineal Body/Introitus   Elevated    External Palpation  TTP transverse peroneus and obdurator bilat    Pelvic Floor Internal Exam  pt informed and identity confirmed for consent to perform internal soft tissue assessment    Exam Type  Vaginal    Palpation  TTP and high tone    Strength  --   unable to palpate - was bulging and pushing abdomen   Tone  high               PT Education - 04/12/19 1252    Education Details  urge techniques    Person(s) Educated  Patient    Methods  Explanation;Handout    Comprehension  Verbalized understanding       PT Short Term Goals - 04/12/19 1235      PT SHORT TERM GOAL #1   Title  Pt will have nocturia reduced to 1x/ night    Baseline  2x/ night nocturia    Time  4    Period  Weeks    Status  New    Target Date  05/10/19        PT Long Term Goals - 04/12/19 1236      PT LONG TERM GOAL #1   Title  Use one or less diapers/day    Baseline  2-3/day    Time  8    Period  Weeks  Status  New    Target Date  06/07/19      PT LONG TERM GOAL #2   Title  Pt will be able to use urge to void techniques to reduce her frequency to at least every 3 hours    Baseline  every 2 hours    Time  8    Period  Weeks    Status  New    Target Date  06/07/19      PT LONG TERM GOAL #3   Title  Pt will be able to walk for at least 1/2 mile    Baseline  walk for 1/4 mile until there is bladder pain    Time  8    Period  Weeks    Status  New    Target Date  06/07/19      PT LONG TERM  GOAL #4   Title  Pt will report at least 50% less bladder pain    Baseline  7/10    Time  8    Period  Weeks    Status  New    Target Date  06/07/19             Plan - 04/12/19 1302    Clinical Impression Statement  Pt presents to clinic today due to pelvic pain that has been ongoing for at least 10 years and worsening severely in the last year.  Pt is anxious throughout the session with PROM and palpation.  Pt has Rt hip pain and tension in pelvic floor Rt>Lt.  Pt has decreased hamstring flexibility and very sensative to stretching sensation.  Pt has decreased hip ROM.  She has decreased coordination with bearing down and bulging when attempting to engage her pelvic floor muscles.  Pt has decreased lumbar flexion and tight lumbar paraspinals.  Increased pelvic anterior tilt and increased thoracic kyphosis.  Pt will benefit from skilled PT to address impairments and return to max function.    Personal Factors and Comorbidities  Comorbidity 3+    Comorbidities  4 pregnancies 3 vaginal; hernai x 3 repair; hysterectomy    Examination-Activity Limitations  Continence;Locomotion Level;Toileting    Examination-Participation Restrictions  Community Activity    Stability/Clinical Decision Making  Evolving/Moderate complexity    Clinical Decision Making  Moderate    Rehab Potential  Good    PT Frequency  2x / week    PT Duration  8 weeks    PT Treatment/Interventions  ADLs/Self Care Home Management;Biofeedback;Cryotherapy;Electrical Stimulation;Moist Heat;Therapeutic activities;Therapeutic exercise;Neuromuscular re-education;Manual techniques;Patient/family education;Passive range of motion;Dry needling    PT Next Visit Plan  review urge to void; coordination of core and breath; abdominal fascial release; biofeedback    PT Home Exercise Plan  urge to void    Consulted and Agree with Plan of Care  Patient       Patient will benefit from skilled therapeutic intervention in order to improve the  following deficits and impairments:  Pain, Hypomobility, Impaired flexibility, Postural dysfunction, Decreased strength, Decreased coordination, Impaired tone, Increased fascial restricitons, Difficulty walking  Visit Diagnosis: Muscle weakness (generalized)  Cramp and spasm  Unspecified lack of coordination     Problem List Patient Active Problem List   Diagnosis Date Noted  . Routine screening for STI (sexually transmitted infection) 10/27/2018  . Transaminitis 05/24/2017  . Gastroesophageal reflux disease without esophagitis 04/06/2017  . MDD (major depressive disorder), recurrent episode, moderate ([place]) 09/08/2016  . Cocaine use disorder, moderate, in sustained remission ([place]) 09/08/2016  .  Abdominal pain 07/19/2016  . OSA (obstructive sleep apnea) 04/15/2016  . Asthma 04/15/2016  . HIV (human immunodeficiency virus infection) ([place]) 04/05/2016  . Essential hypertension 04/05/2016  . Chronic insomnia 04/05/2016    Jule Ser, PT 04/12/2019, 3:20 PM  [facility] [address] [place], [place], [address] Phone: [phone]   Fax:  [phone]  Name: Morganna Styles MRN: [id number] Date of Birth: Mar 29, 1970

## 2019-04-12 NOTE — Patient Instructions (Signed)
Urge to void techniques  Relaxation Exercises with the Urge to Void   When you experience an urge to void:  FIRST  Stop and stand very still    Sit down if you can    Don't move    You need to stay very still to maintain control  SECOND Squeeze your pelvic floor muscles 5 times, like a quick flick, to keep from leaking  THIRD Relax  Take a deep breath and then let it out  Try to make the urge go away by using relaxation and visualization techniques  FINALLY When you feel the urge go away somewhat, walk normally to the bathroom.   If the urge gets suddenly stronger on the way, you may stop again and relax to regain control.

## 2019-04-13 DIAGNOSIS — N393 Stress incontinence (female) (male): Secondary | ICD-10-CM | POA: Diagnosis not present

## 2019-04-13 DIAGNOSIS — B379 Candidiasis, unspecified: Secondary | ICD-10-CM | POA: Diagnosis not present

## 2019-04-13 DIAGNOSIS — B20 Human immunodeficiency virus [HIV] disease: Secondary | ICD-10-CM | POA: Diagnosis not present

## 2019-04-13 DIAGNOSIS — N760 Acute vaginitis: Secondary | ICD-10-CM | POA: Diagnosis not present

## 2019-04-13 DIAGNOSIS — N3281 Overactive bladder: Secondary | ICD-10-CM | POA: Diagnosis not present

## 2019-04-13 DIAGNOSIS — R3981 Functional urinary incontinence: Secondary | ICD-10-CM | POA: Diagnosis not present

## 2019-04-13 DIAGNOSIS — F339 Major depressive disorder, recurrent, unspecified: Secondary | ICD-10-CM | POA: Diagnosis not present

## 2019-04-13 DIAGNOSIS — K219 Gastro-esophageal reflux disease without esophagitis: Secondary | ICD-10-CM | POA: Diagnosis not present

## 2019-04-13 DIAGNOSIS — R1030 Lower abdominal pain, unspecified: Secondary | ICD-10-CM | POA: Diagnosis not present

## 2019-04-13 DIAGNOSIS — J4520 Mild intermittent asthma, uncomplicated: Secondary | ICD-10-CM | POA: Diagnosis not present

## 2019-04-13 DIAGNOSIS — I10 Essential (primary) hypertension: Secondary | ICD-10-CM | POA: Diagnosis not present

## 2019-04-13 DIAGNOSIS — F4310 Post-traumatic stress disorder, unspecified: Secondary | ICD-10-CM | POA: Diagnosis not present

## 2019-04-16 DIAGNOSIS — F411 Generalized anxiety disorder: Secondary | ICD-10-CM | POA: Diagnosis not present

## 2019-04-16 DIAGNOSIS — F410 Panic disorder [episodic paroxysmal anxiety] without agoraphobia: Secondary | ICD-10-CM | POA: Diagnosis not present

## 2019-04-16 DIAGNOSIS — E782 Mixed hyperlipidemia: Secondary | ICD-10-CM | POA: Diagnosis not present

## 2019-04-16 DIAGNOSIS — I10 Essential (primary) hypertension: Secondary | ICD-10-CM | POA: Diagnosis not present

## 2019-04-16 DIAGNOSIS — G4700 Insomnia, unspecified: Secondary | ICD-10-CM | POA: Diagnosis not present

## 2019-04-16 DIAGNOSIS — N3281 Overactive bladder: Secondary | ICD-10-CM | POA: Diagnosis not present

## 2019-04-16 DIAGNOSIS — K760 Fatty (change of) liver, not elsewhere classified: Secondary | ICD-10-CM | POA: Diagnosis not present

## 2019-04-16 DIAGNOSIS — J4520 Mild intermittent asthma, uncomplicated: Secondary | ICD-10-CM | POA: Diagnosis not present

## 2019-04-16 DIAGNOSIS — K219 Gastro-esophageal reflux disease without esophagitis: Secondary | ICD-10-CM | POA: Diagnosis not present

## 2019-04-16 DIAGNOSIS — B20 Human immunodeficiency virus [HIV] disease: Secondary | ICD-10-CM | POA: Diagnosis not present

## 2019-04-16 DIAGNOSIS — N951 Menopausal and female climacteric states: Secondary | ICD-10-CM | POA: Diagnosis not present

## 2019-04-30 ENCOUNTER — Other Ambulatory Visit: Payer: Self-pay

## 2019-04-30 ENCOUNTER — Ambulatory Visit (INDEPENDENT_AMBULATORY_CARE_PROVIDER_SITE_OTHER): Payer: Medicaid Other | Admitting: Psychiatry

## 2019-04-30 ENCOUNTER — Encounter: Payer: Self-pay | Admitting: Psychiatry

## 2019-04-30 DIAGNOSIS — F1421 Cocaine dependence, in remission: Secondary | ICD-10-CM

## 2019-04-30 DIAGNOSIS — R32 Unspecified urinary incontinence: Secondary | ICD-10-CM | POA: Diagnosis not present

## 2019-04-30 DIAGNOSIS — F331 Major depressive disorder, recurrent, moderate: Secondary | ICD-10-CM | POA: Diagnosis not present

## 2019-04-30 DIAGNOSIS — F419 Anxiety disorder, unspecified: Secondary | ICD-10-CM

## 2019-04-30 DIAGNOSIS — F4310 Post-traumatic stress disorder, unspecified: Secondary | ICD-10-CM | POA: Diagnosis not present

## 2019-04-30 DIAGNOSIS — F410 Panic disorder [episodic paroxysmal anxiety] without agoraphobia: Secondary | ICD-10-CM | POA: Insufficient documentation

## 2019-04-30 DIAGNOSIS — N393 Stress incontinence (female) (male): Secondary | ICD-10-CM | POA: Diagnosis not present

## 2019-04-30 MED ORDER — BUPROPION HCL ER (XL) 150 MG PO TB24: 150.0000 mg | ORAL_TABLET | ORAL | 1 refills | Status: DC
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
## 2019-05-01 ENCOUNTER — Ambulatory Visit: Payer: Medicaid Other | Attending: Urology | Admitting: Physical Therapy

## 2019-05-01 ENCOUNTER — Encounter: Payer: Self-pay | Admitting: Physical Therapy

## 2019-05-01 DIAGNOSIS — R279 Unspecified lack of coordination: Secondary | ICD-10-CM | POA: Diagnosis not present

## 2019-05-01 DIAGNOSIS — M6281 Muscle weakness (generalized): Secondary | ICD-10-CM | POA: Insufficient documentation

## 2019-05-01 DIAGNOSIS — R252 Cramp and spasm: Secondary | ICD-10-CM | POA: Diagnosis not present

## 2019-05-01 NOTE — Patient Instructions (Signed)
Access Code: [email or web address]  URL: [email or web address]  Date: 05/01/2019  Prepared by: Jari Favre   Exercises Supine Diaphragmatic Breathing with Pelvic Floor Lengthening - 10 reps - 1 sets - 3x daily - 7x weekly  Gave urge drills sheet

## 2019-05-01 NOTE — Therapy (Addendum)
[facility] [address] [place], [place], [address] Phone: [phone]   Fax:  [phone]  Physical Therapy Treatment  Patient Details  Name: Chelsey Henson MRN: [id number] Date of Birth: 02/10/1971 Referring Provider (PT): Robley Fries, MD   Encounter Date: 05/01/2019  PT End of Session - 05/01/19 1049    Visit Number  2    Date for PT Re-Evaluation  06/07/19    PT Start Time  4496    PT Stop Time  1112   pt wanted to end session early due to pain   PT Time Calculation (min)  27 min    Activity Tolerance  Patient tolerated treatment well;Patient limited by pain    Behavior During Therapy  [facility] for tasks assessed/performed;Anxious       Past Medical History:  Diagnosis Date  . Anemia   . Anxiety   . Asthma   . Depression   . Genital herpes   . GERD (gastroesophageal reflux disease)   . HIV (human immunodeficiency virus infection) ([place]) 11/24/2017  . HIV infection ([place]) 2008  . Hypertension   . Sleep apnea   . Substance abuse ([place])    RECOVERED 7 YEARS - COCAINE  . Vaginal Pap smear, abnormal     Past Surgical History:  Procedure Laterality Date  . ABDOMINAL HYSTERECTOMY     BENIGN MASS  . CESAREAN SECTION    . ESOPHAGOGASTRODUODENOSCOPY N/A 06/30/2017   Procedure: ESOPHAGOGASTRODUODENOSCOPY (EGD);  Surgeon: Rogene Houston, MD;  [facility];  Service: Endoscopy;  Laterality: N/A;  2:00  . HERNIA REPAIR  2017    There were no vitals filed for this visit.  Subjective Assessment - 05/01/19 1048    Subjective  Pt states she is not having pain today.  She reports she was not able to work on urge to void techniques "there was no time".    Patient Stated Goals  get rid of the pain down to 2/10    Currently in Pain?  No/denies                       [facility] Adult PT Treatment/Exercise - 05/01/19 0001      Neuro Re-ed    Neuro Re-ed Details   diaphragmatic breathing  technique to relax and lengthen pelvic floor; exhale with TrA activation      Lumbar Exercises: Stretches   Single Knee to Chest Stretch  Right;Left;1 rep;20 seconds      Lumbar Exercises: Supine   Ab Set  10 reps;5 seconds    Large Ball Abdominal Isometric Limitations  UE ball overhead with TrA; ball presses      Manual Therapy   Manual Therapy  Myofascial release             PT Education - 05/01/19 1118    Education Details  reviewed urge techniques; Access Code: [email or web address]    Person(s) Educated  Patient    Methods  Explanation;Demonstration;Verbal cues;Handout    Comprehension  Returned demonstration;Verbalized understanding       PT Short Term Goals - 04/12/19 1235      PT SHORT TERM GOAL #1   Title  Pt will have nocturia reduced to 1x/ night    Baseline  2x/ night nocturia    Time  4    Period  Weeks    Status  New    Target Date  05/10/19  PT Long Term Goals - 04/12/19 1236      PT LONG TERM GOAL #1   Title  Use one or less diapers/day    Baseline  2-3/day    Time  8    Period  Weeks    Status  New    Target Date  06/07/19      PT LONG TERM GOAL #2   Title  Pt will be able to use urge to void techniques to reduce her frequency to at least every 3 hours    Baseline  every 2 hours    Time  8    Period  Weeks    Status  New    Target Date  06/07/19      PT LONG TERM GOAL #3   Title  Pt will be able to walk for at least 1/2 mile    Baseline  walk for 1/4 mile until there is bladder pain    Time  8    Period  Weeks    Status  New    Target Date  06/07/19      PT LONG TERM GOAL #4   Title  Pt will report at least 50% less bladder pain    Baseline  7/10    Time  8    Period  Weeks    Status  New    Target Date  06/07/19            Plan - 05/01/19 1120    Clinical Impression Statement  Pt tolerated very light myfascial release to the abdomen but began to get more sensative to tough to lower abdomen.  Session then shifted to very  basic transversus abdominus strengthening with breathing and differet ways in supine.  Pt did not tolerate these after exercises she wanted to end the session.  Pt was not open to any pain management techniuqes such as heat or ice and she reported she will just "take a motrin" when she gets in the car.  Pt will benefit from working on coordination of core and pelvic floor for reduced symptoms of leakage and pain.    PT Treatment/Interventions  ADLs/Self Care Home Management;Biofeedback;Cryotherapy;Electrical Stimulation;Moist Heat;Therapeutic activities;Therapeutic exercise;Neuromuscular re-education;Manual techniques;Patient/family education;Passive range of motion;Dry needling    PT Next Visit Plan  biofeedback pelvic floor relax and contract without bulging    PT Home Exercise Plan  urge to void, Access Code: [email or web address]    Consulted and Agree with Plan of Care  Patient       Patient will benefit from skilled therapeutic intervention in order to improve the following deficits and impairments:  Pain, Hypomobility, Impaired flexibility, Postural dysfunction, Decreased strength, Decreased coordination, Impaired tone, Increased fascial restricitons, Difficulty walking  Visit Diagnosis: Muscle weakness (generalized)  Cramp and spasm  Unspecified lack of coordination     Problem List Patient Active Problem List   Diagnosis Date Noted  . Post traumatic stress disorder (PTSD) 04/30/2019  . Anxiety 04/30/2019  . Routine screening for STI (sexually transmitted infection) 10/27/2018  . Transaminitis 05/24/2017  . Gastroesophageal reflux disease without esophagitis 04/06/2017  . MDD (major depressive disorder), recurrent episode, moderate ([place]) 09/08/2016  . Cocaine use disorder, moderate, in sustained remission ([place]) 09/08/2016  . Abdominal pain 07/19/2016  . OSA (obstructive sleep apnea) 04/15/2016  . Asthma 04/15/2016  . HIV (human immunodeficiency virus infection) ([place]) 04/05/2016  .  Essential hypertension 04/05/2016  . Chronic insomnia 04/05/2016    Jule Ser, PT 05/01/2019, 11:54  AM  [facility] [address], [place], [place], [address] Phone: [phone]   Fax:  [phone]  Name: Chelsey Henson MRN: [id number] Date of Birth: 05-26-1970  PHYSICAL THERAPY DISCHARGE SUMMARY  Visits from Start of Care: 2  Current functional level related to goals / functional outcomes: See above   Remaining deficits: See Jethro Poling   Education / Equipment: HEP  Plan: Patient agrees to discharge.  Patient goals were not met. Patient is being discharged due to not returning since the last visit.  ?????     [employer], PT 05/15/19 12:50 PM

## 2019-05-08 ENCOUNTER — Ambulatory Visit: Payer: Medicaid Other | Admitting: Orthopaedic Surgery

## 2019-05-08 ENCOUNTER — Encounter: Payer: Self-pay | Admitting: Orthopaedic Surgery

## 2019-05-08 ENCOUNTER — Other Ambulatory Visit: Payer: Self-pay

## 2019-05-08 ENCOUNTER — Ambulatory Visit: Payer: Medicaid Other | Admitting: Physical Therapy

## 2019-05-08 ENCOUNTER — Telehealth: Payer: Self-pay | Admitting: Physical Therapy

## 2019-05-08 VITALS — Ht 64.0 in | Wt 210.0 lb

## 2019-05-08 DIAGNOSIS — M25561 Pain in right knee: Secondary | ICD-10-CM

## 2019-05-08 DIAGNOSIS — G8929 Other chronic pain: Secondary | ICD-10-CM

## 2019-05-08 NOTE — Progress Notes (Signed)
PROCEDURE NOTE:  The patient requests injections of the right knee , verbal consent was obtained.  The right knee was prepped appropriately after time out was performed.   Sterile technique was observed and injection of 1 cc of Depo-Medrol 40 mg with several cc's of plain xylocaine. Anesthesia was provided by ethyl chloride and a 20-gauge needle was used to inject the knee area. The injection was tolerated well.  A band aid dressing was applied.  The patient was advised to apply ice later today and tomorrow to the injection sight as needed.  If she is still having pain in three weeks and giving way, get MRI of the right knee on return.  Electronically Signed Sanjuana Kava, MD 3/9/20211:47 PM

## 2019-05-08 NOTE — Telephone Encounter (Signed)
Patient did not show for appointment.  Patient was called and PT left message to please call us back.  Gustavus Bryant, PT 05/08/19 1:57 PM

## 2019-05-10 DIAGNOSIS — R102 Pelvic and perineal pain: Secondary | ICD-10-CM | POA: Diagnosis not present

## 2019-05-10 DIAGNOSIS — R3915 Urgency of urination: Secondary | ICD-10-CM | POA: Diagnosis not present

## 2019-05-10 DIAGNOSIS — R350 Frequency of micturition: Secondary | ICD-10-CM | POA: Diagnosis not present

## 2019-05-15 ENCOUNTER — Ambulatory Visit: Payer: Medicaid Other | Admitting: Physical Therapy

## 2019-05-29 ENCOUNTER — Other Ambulatory Visit: Payer: Self-pay

## 2019-05-29 ENCOUNTER — Ambulatory Visit: Payer: Medicaid Other | Admitting: Orthopaedic Surgery

## 2019-05-29 ENCOUNTER — Encounter: Payer: Self-pay | Admitting: Orthopaedic Surgery

## 2019-05-29 VITALS — BP 127/93 | HR 71 | Temp 97.3°F | Ht 64.0 in | Wt 211.0 lb

## 2019-05-29 DIAGNOSIS — G8929 Other chronic pain: Secondary | ICD-10-CM | POA: Diagnosis not present

## 2019-05-29 DIAGNOSIS — M25561 Pain in right knee: Secondary | ICD-10-CM | POA: Diagnosis not present

## 2019-05-29 NOTE — Progress Notes (Signed)
My knee is much better  She has little pain of the knee.  She is doing well. She has no further swelling or pain.  NV intact.  ROM is near full today.  Encounter Diagnosis  Name Primary?  . Chronic pain of right knee Yes   I will see as needed.  Call if any problem.  Precautions discussed.   Electronically Signed Sanjuana Kava, MD 3/30/202110:20 AM

## 2019-05-30 ENCOUNTER — Ambulatory Visit: Payer: Medicaid Other | Admitting: Psychiatry

## 2019-05-30 DIAGNOSIS — G4733 Obstructive sleep apnea (adult) (pediatric): Secondary | ICD-10-CM | POA: Diagnosis not present

## 2019-05-31 DIAGNOSIS — N393 Stress incontinence (female) (male): Secondary | ICD-10-CM | POA: Diagnosis not present

## 2019-05-31 DIAGNOSIS — R32 Unspecified urinary incontinence: Secondary | ICD-10-CM | POA: Diagnosis not present

## 2019-06-06 ENCOUNTER — Other Ambulatory Visit: Payer: Self-pay

## 2019-06-06 ENCOUNTER — Ambulatory Visit (INDEPENDENT_AMBULATORY_CARE_PROVIDER_SITE_OTHER): Payer: Medicaid Other | Admitting: Psychiatry

## 2019-06-06 ENCOUNTER — Encounter: Payer: Self-pay | Admitting: Psychiatry

## 2019-06-06 DIAGNOSIS — F4310 Post-traumatic stress disorder, unspecified: Secondary | ICD-10-CM | POA: Diagnosis not present

## 2019-06-06 DIAGNOSIS — F1421 Cocaine dependence, in remission: Secondary | ICD-10-CM | POA: Diagnosis not present

## 2019-06-06 DIAGNOSIS — F419 Anxiety disorder, unspecified: Secondary | ICD-10-CM | POA: Diagnosis not present

## 2019-06-06 DIAGNOSIS — F3341 Major depressive disorder, recurrent, in partial remission: Secondary | ICD-10-CM

## 2019-06-06 MED ORDER — BUPROPION HCL ER (XL) 150 MG PO TB24: 150.0000 mg | ORAL_TABLET | ORAL | 0 refills | Status: DC

## 2019-06-06 MED ORDER — QUETIAPINE FUMARATE 100 MG PO TABS: 100.0000 mg | ORAL_TABLET | Freq: Every day | ORAL | 0 refills | Status: DC

## 2019-06-06 MED ORDER — BUSPIRONE HCL 15 MG PO TABS: 15.0000 mg | ORAL_TABLET | Freq: Two times a day (BID) | ORAL | 0 refills | Status: DC

## 2019-06-06 MED ORDER — ESCITALOPRAM OXALATE 20 MG PO TABS: ORAL_TABLET | ORAL | 0 refills | Status: DC

## 2019-06-06 MED ORDER — PROPRANOLOL HCL 10 MG PO TABS: 10.0000 mg | ORAL_TABLET | Freq: Three times a day (TID) | ORAL | 0 refills | Status: DC

## 2019-06-06 NOTE — Progress Notes (Signed)
BH MD/PA/NP OP Progress Note  I connected with  [name] on 06/06/19 by a video enabled telemedicine application and verified that I am speaking with the correct person using two identifiers.   I discussed the limitations of evaluation and management by telemedicine. The patient expressed understanding and agreed to proceed.    06/06/2019 2:55 PM Page Pucciarelli  MRN:  [id number]  Chief Complaint: " I am doing well."  HPI: Patient reported that she is doing well.  She noticed improvement in her mood after starting Wellbutrin.  She stated that she is coming out of her room more and is not isolating as much.  She stated that her energy has improved some as well.  She feels the combination is working well for her.  She does not think the medications need to be adjusted anymore at this point.  She informed that her family and her father are doing well.  She denies any acute issues or concerns at this time. She has not been able to connect with any therapists at this point.  Visit Diagnosis:    ICD-10-CM   1. MDD (major depressive disorder), recurrent, in partial remission ([place])  F33.41   2. Post traumatic stress disorder (PTSD)  F43.10   3. Cocaine use disorder, moderate, in sustained remission (HCC)  F14.21   4. Anxiety  F41.9     Past Psychiatric History: MDD, PTSD, substance abuse  Past Medical History:  Past Medical History:  Diagnosis Date  . Anemia   . Anxiety   . Asthma   . Depression   . Genital herpes   . GERD (gastroesophageal reflux disease)   . HIV (human immunodeficiency virus infection) ([place]) 11/24/2017  . HIV infection ([place]) 2008  . Hypertension   . Sleep apnea   . Substance abuse ([place])    RECOVERED 7 YEARS - COCAINE  . Vaginal Pap smear, abnormal     Past Surgical History:  Procedure Laterality Date  . ABDOMINAL HYSTERECTOMY     BENIGN MASS  . CESAREAN SECTION    . ESOPHAGOGASTRODUODENOSCOPY N/A 06/30/2017   Procedure: ESOPHAGOGASTRODUODENOSCOPY (EGD);   Surgeon: Rogene Houston, MD;  [facility];  Service: Endoscopy;  Laterality: N/A;  2:00  . HERNIA REPAIR  2017    Family Psychiatric History: Father-depression, anxiety, mother-depression, substance abuse in siblings   Family History:  Family History  Problem Relation Age of Onset  . Depression Mother   . Hyperlipidemia Mother   . Learning disabilities Mother   . Alcohol abuse Mother 61  . Alcohol abuse Father   . Arthritis Father   . Cancer Father   . Heart disease Father   . Hypertension Father   . Learning disabilities Father   . Alcohol abuse Sister        RECOVERED  . Alcohol abuse Brother   . Alcohol abuse Brother   . Alcohol abuse Brother     Social History:  Social History   Socioeconomic History  . Marital status: Married    Spouse name: Legrand Como  . Number of children: 4  . Years of education: 8  . Highest education level: Not on file  Occupational History  . 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 Smoking status: Never Smoker  . Smokeless tobacco: Never Used  Substance and Sexual Activity  . Alcohol use: Yes    Alcohol/week: 1.0 standard drinks    Types: 1 Cans of beer per week  Comment: occ.  . Drug use: No    Comment: none in 7 years  . Sexual activity: Not Currently    Birth control/protection: Surgical  Other Topics Concern  . 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 Difficulty of Paying Living Expenses:   Food Insecurity:   . Worried About Charity fundraiser in the Last Year:   . Arboriculturist in the Last Year:   Transportation Needs:   . Film/video editor (Medical):   Marland Kitchen Lack of Transportation (Non-Medical):   Physical Activity:   . Days of Exercise per Week:    . Minutes of Exercise per Session:   Stress:   . Feeling of Stress :   Social Connections:   . Frequency of Communication with Friends and Family:   . Frequency of Social Gatherings with Friends and Family:   . Attends Religious Services:   . Active Member of Clubs or Organizations:   . Attends Archivist Meetings:   Marland Kitchen Marital Status:     Allergies:  Allergies  Allergen Reactions  . Abilify [Aripiprazole] Other (See Comments)    Tardive dyskinesia  . Codeine Itching    Metabolic Disorder Labs: No results found for: HGBA1C, MPG No results found for: PROLACTIN Lab Results  Component Value Date   CHOL 248 (H) 02/23/2018   TRIG 170 (H) 02/23/2018   HDL 57 02/23/2018   CHOLHDL 4.4 02/23/2018   VLDL 46 (H) 05/17/2016   LDLCALC 159 (H) 02/23/2018   LDLCALC 99 05/17/2016   No results found for: TSH  Therapeutic Level Labs: No results found for: LITHIUM No results found for: VALPROATE No components found for:  CBMZ  Current Medications: Current Outpatient Medications  Medication Sig Dispense Refill  . acyclovir (ZOVIRAX) 800 MG tablet TAKE 1 TABLET EVERY DAY. (Patient taking differently: Take 800 mg by mouth at bedtime. ) 90 tablet 3  . albuterol (PROVENTIL HFA;VENTOLIN HFA) 108 (90 Base) MCG/ACT inhaler Inhale 2 puffs into the lungs every 4 (four) hours as needed for wheezing or shortness of breath. 1 Inhaler 3  . albuterol (PROVENTIL) (2.5 MG/3ML) 0.083% nebulizer solution USE 1 VIAL IN NEBULIZER EVERY 6 HOURS AS NEEDED FOR SHORTNESS OF BREATH OR WHEEZING. 75 mL 0  . benzonatate (TESSALON) 100 MG capsule Take 100 mg by mouth 3 (three) times daily as needed for cough.    Marland Kitchen buPROPion (WELLBUTRIN XL) 150 MG 24 hr tablet Take 1 tablet (150 mg total) by mouth every morning. 30 tablet 1  . busPIRone (BUSPAR) 15 MG tablet Take 15 mg by mouth 2 (two) times daily.    Marland Kitchen conjugated estrogens (PREMARIN) vaginal cream Place 1 Applicatorful vaginally daily. (Patient not  taking: Reported on 04/10/2019) 30 g 11  . escitalopram (LEXAPRO) 20 MG tablet TAKE (1) TABLET BY MOUTH ONCE DAILY. (Patient taking differently: Take 20 mg by mouth at bedtime) 90 tablet 1  . estradiol (ESTRACE) 0.1 MG/GM vaginal cream Use 1 grams per vagina nightly (Patient not taking: Reported on 04/10/2019) 30 g 12  . estradiol (ESTRACE) 1 MG tablet Take 1 mg by mouth every evening.     . fluconazole (DIFLUCAN) 100 MG tablet Take 1  tablet (100 mg total) by mouth daily. 7 tablet 0  . GENVOYA 150-150-200-10 MG TABS tablet TAKE ONE TABLET BY MOUTH ONCE DAILY. 30 tablet 5  . lisinopril-hydrochlorothiazide (PRINZIDE,ZESTORETIC) 10-12.5 MG tablet Take 1 tablet by mouth daily. (Patient taking differently: Take 1 tablet by mouth every evening. ) 90 tablet 3  . Melatonin 1 MG CAPS Take 2 mg by mouth at bedtime.    . naproxen (NAPROSYN) 500 MG tablet Take 1 tablet (500 mg total) by mouth 2 (two) times daily with a meal. 60 tablet 5  . oxybutynin (DITROPAN-XL) 10 MG 24 hr tablet TAKE (1) TABLET BY MOUTH AT BEDTIME. (Patient taking differently: Take 10 mg by mouth at bedtime. ) 90 tablet 0  . propranolol (INDERAL) 10 MG tablet Take 10 mg by mouth 3 (three) times daily.    . QUEtiapine (SEROQUEL) 100 MG tablet Take 100 mg by mouth at bedtime.     No current facility-administered medications for this visit.    Musculoskeletal: Strength & Muscle Tone: unable to assess due to telemed visit Gait & Station: unable to assess due to telemed visit Patient leans: unable to assess due to telemed visit   Psychiatric Specialty Exam: Review of Systems  Last menstrual period 09/30/2014.There is no height or weight on file to calculate BMI.  General Appearance: Fairly Groomed  Eye Contact:  Good  Speech:  Clear and Coherent and Normal Rate  Volume:  Normal  Mood:  Euthymic  Affect:  Congruent  Thought Process:  Goal Directed and Descriptions of Associations: Intact  Orientation:  Full (Time, Place, and Person)   Thought Content: Logical   Suicidal Thoughts:  No  Homicidal Thoughts:  No  Memory:  Immediate;   Good Recent;   Good  Judgement:  Fair  Insight:  Fair  Psychomotor Activity:  Normal  Concentration:  Concentration: Good and Attention Span: Good  Recall:  Good  Fund of Knowledge: Good  Language: Good  Akathisia:  Negative  Handed:  Right  AIMS (if indicated): not done  Assets:  Communication Skills Desire for Improvement Financial Resources/Insurance Housing  ADL's:  Intact  Cognition: WNL  Sleep:  Good   Screenings: PHQ2-9     Office Visit from 10/25/2018 in [facility] for Infectious Disease Office Visit from 04/26/2018 in [facility] for Infectious Disease Office Visit from 07/26/2017 in [facility] for Infectious Disease Office Visit from 06/20/2017 in [facility] Office Visit from 03/04/2017 in [facility]  PHQ-2 Total Score  1  0  0  2  0  PHQ-9 Total Score  -  -  -  4  -       Assessment and Plan: Patient appears to be doing better after Wellbutrin was added to her regimen.  She denied any acute issues or concerns at this time.  She expressed interest in her being connected to a therapist and was provided with contact information for one.  1. MDD (major depressive disorder), recurrent, in partial remission (HCC)  - escitalopram (LEXAPRO) 20 MG tablet; TAKE (1) TABLET BY MOUTH ONCE DAILY.  Dispense: 90 tablet; Refill: 0 - busPIRone (BUSPAR) 15 MG tablet; Take 1 tablet (15 mg total) by mouth 2 (two) times daily.  Dispense: 180 tablet; Refill: 0 - QUEtiapine (SEROQUEL) 100 MG tablet; Take 1 tablet (100 mg total) by mouth at bedtime.  Dispense: 90 tablet; Refill: 0 - buPROPion (WELLBUTRIN XL) 150 MG 24 hr tablet; Take 1 tablet (  150 mg total) by mouth every morning.  Dispense: 90 tablet; Refill: 0  2. Post traumatic stress disorder (PTSD)  - escitalopram (LEXAPRO) 20 MG tablet; TAKE (1) TABLET BY MOUTH ONCE  DAILY.  Dispense: 90 tablet; Refill: 0 - buPROPion (WELLBUTRIN XL) 150 MG 24 hr tablet; Take 1 tablet (150 mg total) by mouth every morning.  Dispense: 90 tablet; Refill: 0  3. Cocaine use disorder, moderate, in sustained remission ([place])   4. Anxiety  - busPIRone (BUSPAR) 15 MG tablet; Take 1 tablet (15 mg total) by mouth 2 (two) times daily.  Dispense: 180 tablet; Refill: 0 - propranolol (INDERAL) 10 MG tablet; Take 1 tablet (10 mg total) by mouth 3 (three) times daily.  Dispense: 270 tablet; Refill: 0  Pt was provided with contact information for therapist in the community.  Patient stated she will contact them for appointment. Continue same medication regimen. Follow up in 2 months.    N[place]ada Crane, MD 06/06/2019, 2:55 PM

## 2019-06-15 ENCOUNTER — Other Ambulatory Visit: Payer: Self-pay | Admitting: Internal Medicine

## 2019-06-15 ENCOUNTER — Other Ambulatory Visit (HOSPITAL_BASED_OUTPATIENT_CLINIC_OR_DEPARTMENT_OTHER): Payer: Self-pay

## 2019-06-15 DIAGNOSIS — G4710 Hypersomnia, unspecified: Secondary | ICD-10-CM

## 2019-06-15 DIAGNOSIS — R0683 Snoring: Secondary | ICD-10-CM

## 2019-06-15 DIAGNOSIS — G4730 Sleep apnea, unspecified: Secondary | ICD-10-CM

## 2019-06-26 DIAGNOSIS — H5213 Myopia, bilateral: Secondary | ICD-10-CM | POA: Diagnosis not present

## 2019-06-30 DIAGNOSIS — N393 Stress incontinence (female) (male): Secondary | ICD-10-CM | POA: Diagnosis not present

## 2019-06-30 DIAGNOSIS — R32 Unspecified urinary incontinence: Secondary | ICD-10-CM | POA: Diagnosis not present

## 2019-07-02 ENCOUNTER — Other Ambulatory Visit (HOSPITAL_COMMUNITY)
Admission: RE | Admit: 2019-07-02 | Discharge: 2019-07-02 | Disposition: A | Discharge status: Home / Self Care | Payer: Medicaid Other | Source: Ambulatory Visit | Attending: Internal Medicine | Admitting: Internal Medicine

## 2019-07-02 ENCOUNTER — Other Ambulatory Visit: Payer: Self-pay

## 2019-07-02 DIAGNOSIS — Z01812 Encounter for preprocedural laboratory examination: Secondary | ICD-10-CM | POA: Diagnosis not present

## 2019-07-02 DIAGNOSIS — Z20822 Contact with and (suspected) exposure to covid-19: Secondary | ICD-10-CM | POA: Diagnosis not present

## 2019-07-02 LAB — SARS CORONAVIRUS 2 (TAT 6-24 HRS): SARS Coronavirus 2: NEGATIVE

## 2019-07-04 ENCOUNTER — Ambulatory Visit: Payer: Medicaid Other | Attending: Internal Medicine | Admitting: Internal Medicine

## 2019-07-04 ENCOUNTER — Other Ambulatory Visit: Payer: Self-pay

## 2019-07-04 DIAGNOSIS — R0902 Hypoxemia: Secondary | ICD-10-CM | POA: Insufficient documentation

## 2019-07-04 DIAGNOSIS — G4730 Sleep apnea, unspecified: Secondary | ICD-10-CM

## 2019-07-04 DIAGNOSIS — G4710 Hypersomnia, unspecified: Secondary | ICD-10-CM

## 2019-07-04 DIAGNOSIS — R0683 Snoring: Secondary | ICD-10-CM | POA: Diagnosis present

## 2019-07-04 DIAGNOSIS — G4733 Obstructive sleep apnea (adult) (pediatric): Secondary | ICD-10-CM | POA: Diagnosis not present

## 2019-07-09 DIAGNOSIS — G4733 Obstructive sleep apnea (adult) (pediatric): Secondary | ICD-10-CM | POA: Diagnosis not present

## 2019-07-09 NOTE — Procedures (Signed)
   NAME: Chelsey Henson DATE OF BIRTH:  [birth year] MEDICAL RECORD NUMBER [id number]  LOCATION: [facility]  PHYSICIAN: Marius Ditch  DATE OF STUDY: 07/04/2019  SLEEP STUDY TYPE: Nocturnal Polysomnogram               REFERRING PHYSICIAN: Marius Ditch, MD  INDICATION FOR STUDY: History of OSA treated with CPAP. She felt better on CPAP. Machine recently broke and test was done to re-confirm diagnosis as old study is not available. Without OSA, she has snoring, witnessed apnea and excessive daytime sleepiness.   EPWORTH SLEEPINESS SCORE:  3 HEIGHT:    WEIGHT:      There is no height or weight on file to calculate BMI.  NECK SIZE:   in.  MEDICATIONS Patient self administered medications include: N/A. Medications administered during study include No sleep medicine administered.Marland Kitchen  SLEEP STUDY TECHNIQUE A multi-channel overnight Polysomnography study was performed. The channels recorded and monitored were central and occipital EEG, electrooculogram (EOG), submentalis EMG (chin), nasal and oral airflow, thoracic and abdominal wall motion, anterior tibialis EMG, snore microphone, electrocardiogram, and a pulse oximetry.  TECHNICAL COMMENTS Comments added by Technician: Patient was restless all through the night. Comments added by Scorer: N/A  SLEEP ARCHITECTURE The study was initiated at 9:02:15 PM and terminated at 5:45:28 AM. The total recorded time was 523.2 minutes. EEG confirmed total sleep time was 420.5 minutes yielding a sleep efficiency of 80.4%%. Sleep onset after lights out was 74.3 minutes with a REM latency of 305.5 minutes. The patient spent 3.9%% of the night in stage N1 sleep, 84.5%% in stage N2 sleep, 0.7%% in stage N3 and 10.8% in REM. Wake after sleep onset (WASO) was 28.4 minutes. The Arousal Index was 7.8/hour.  RESPIRATORY PARAMETERS There were a total of 50 respiratory disturbances out of which 5 were apneas ( 3 obstructive, 0 mixed, 2 central)  and 45 hypopneas. The apnea/hypopnea index (AHI) was 7.1 events/hour. The central sleep apnea index was 0.3 events/hour. The REM AHI was 30.3 events/hour and NREM AHI was 4.3 events/hour. The supine AHI was 6.1 events/hour and the non supine AHI was 9.7/hr. The patient's RDI was 11.4/hr overall and 33/hr in REM sleep. Respiratory disturbances were associated with oxygen desaturation down to a nadir of 80.0% during sleep. The mean oxygen saturation during the study was 94.4%. The cumulative time under 88% oxygen saturation was 5.5 minutes.  LEG MOVEMENT DATA The total leg movements were 0 with a resulting leg movement index of 0.0/hr . Associated arousal with leg movement index was 0.0/hr.  CARDIAC DATA The underlying cardiac rhythm was most consistent with sinus rhythm. Mean heart rate during sleep was 67.0 bpm. Additional rhythm abnormalities include None.  IMPRESSIONS - Mild Obstructive Sleep apnea(OSA) with symptoms of daytime sleepiness.  - Severe hypoxemia.  - No Significant Central Sleep Apnea (CSA) - No significant periodic leg movements(PLMs) during sleep.   DIAGNOSIS - Obstructive Sleep Apnea (327.23 [G47.33 ICD-10]) - Nocturnal Hypoxemia (327.26 [G47.36 ICD-10])  RECOMMENDATIONS - The patient should be treated due to presence of severe hypoxemia and symptoms of daytime sleepiness. She felt better when on CPAP before her machine broke.   Marius Ditch Sleep specialist, [place] Board of Internal Medicine  ELECTRONICALLY SIGNED ON:  07/09/2019, 8:22 PM [place] PH: [phone]   FX: [phone] [place]

## 2019-07-13 DIAGNOSIS — H5213 Myopia, bilateral: Secondary | ICD-10-CM | POA: Diagnosis not present

## 2019-07-20 ENCOUNTER — Other Ambulatory Visit: Payer: Self-pay

## 2019-07-20 ENCOUNTER — Ambulatory Visit (INDEPENDENT_AMBULATORY_CARE_PROVIDER_SITE_OTHER): Payer: Medicaid Other

## 2019-07-20 ENCOUNTER — Ambulatory Visit
Admission: EM | Admit: 2019-07-20 | Discharge: 2019-07-20 | Disposition: A | Discharge status: Home / Self Care | Payer: Medicaid Other | Attending: Family Medicine | Admitting: Family Medicine

## 2019-07-20 DIAGNOSIS — S99922A Unspecified injury of left foot, initial encounter: Secondary | ICD-10-CM | POA: Diagnosis not present

## 2019-07-20 DIAGNOSIS — S92512A Displaced fracture of proximal phalanx of left lesser toe(s), initial encounter for closed fracture: Secondary | ICD-10-CM

## 2019-07-20 DIAGNOSIS — M79672 Pain in left foot: Secondary | ICD-10-CM | POA: Diagnosis not present

## 2019-07-20 DIAGNOSIS — S92511A Displaced fracture of proximal phalanx of right lesser toe(s), initial encounter for closed fracture: Secondary | ICD-10-CM | POA: Diagnosis not present

## 2019-07-20 MED ORDER — HYDROCODONE-ACETAMINOPHEN 5-325 MG PO TABS: 1.0000 | ORAL_TABLET | ORAL | 0 refills | Status: AC | PRN

## 2019-07-20 NOTE — ED Triage Notes (Signed)
Pain, swelling and bruising to left foot after tripping over dog and then tripping over a chair. Did not hit head, no other injuries. Ambulatory.

## 2019-07-20 NOTE — Discharge Instructions (Addendum)
You have broken one of the bones in your foot.  Take the ibuprofen as prescribed. Rest and elevate your foot. Apply ice packs 2-3 times a day for up to 20 minutes each. Wear the post op shoe when you are up and active.  Follow up with your primary care provider or an orthopedist if you symptoms continue or worsen; Or if you develop new symptoms, such as numbness, tingling, or weakness.

## 2019-07-20 NOTE — ED Provider Notes (Signed)
[facility]   [id number] 07/20/19 Arrival Time: 1229  [id number] PAIN  SUBJECTIVE: History from: patient. Bayli Quesinberry is a 49 y.o. female complains of left foot pain that began earlier today after she tripped over her dog and then tripping over a chair. Localizes the pain to the lateral aspect of the left foot. Describes the pain as constant and achy in character. Has tried OTC medications without relief. Symptoms are made worse with activity. Denies similar symptoms in the past. Denies fever, chills, erythema, ecchymosis, effusion, weakness, numbness and tingling, saddle paresthesias, loss of bowel or bladder function.      ROS: As per HPI.  All other pertinent ROS negative.     Past Medical History:  Diagnosis Date  . Anemia   . Anxiety   . Asthma   . Depression   . Genital herpes   . GERD (gastroesophageal reflux disease)   . HIV (human immunodeficiency virus infection) ([place]) 11/24/2017  . HIV infection ([place]) 2008  . Hypertension   . Sleep apnea   . Substance abuse ([place])    RECOVERED 7 YEARS - COCAINE  . Vaginal Pap smear, abnormal    Past Surgical History:  Procedure Laterality Date  . ABDOMINAL HYSTERECTOMY     BENIGN MASS  . CESAREAN SECTION    . ESOPHAGOGASTRODUODENOSCOPY N/A 06/30/2017   Procedure: ESOPHAGOGASTRODUODENOSCOPY (EGD);  Surgeon: Rogene Houston, MD;  [facility];  Service: Endoscopy;  Laterality: N/A;  2:00  . HERNIA REPAIR  2017   Allergies  Allergen Reactions  . Abilify [Aripiprazole] Other (See Comments)    Tardive dyskinesia  . Codeine Itching   No current facility-administered medications on file prior to encounter.   Current Outpatient Medications on File Prior to Encounter  Medication Sig Dispense Refill  . acyclovir (ZOVIRAX) 800 MG tablet TAKE 1 TABLET EVERY DAY. (Patient taking differently: Take 800 mg by mouth at bedtime. ) 90 tablet 3  . albuterol (PROVENTIL HFA;VENTOLIN HFA) 108 (90 Base) MCG/ACT inhaler Inhale 2  puffs into the lungs every 4 (four) hours as needed for wheezing or shortness of breath. 1 Inhaler 3  . albuterol (PROVENTIL) (2.5 MG/3ML) 0.083% nebulizer solution USE 1 VIAL IN NEBULIZER EVERY 6 HOURS AS NEEDED FOR SHORTNESS OF BREATH OR WHEEZING. 75 mL 0  . buPROPion (WELLBUTRIN XL) 150 MG 24 hr tablet Take 1 tablet (150 mg total) by mouth every morning. 90 tablet 0  . busPIRone (BUSPAR) 15 MG tablet Take 1 tablet (15 mg total) by mouth 2 (two) times daily. 180 tablet 0  . escitalopram (LEXAPRO) 20 MG tablet TAKE (1) TABLET BY MOUTH ONCE DAILY. 90 tablet 0  . estradiol (ESTRACE) 1 MG tablet Take 1 mg by mouth every evening.     Marland Kitchen GENVOYA 150-150-200-10 MG TABS tablet TAKE ONE TABLET BY MOUTH ONCE DAILY. 30 tablet 3  . lisinopril-hydrochlorothiazide (PRINZIDE,ZESTORETIC) 10-12.5 MG tablet Take 1 tablet by mouth daily. (Patient taking differently: Take 1 tablet by mouth every evening. ) 90 tablet 3  . Melatonin 1 MG CAPS Take 2 mg by mouth at bedtime.    . naproxen (NAPROSYN) 500 MG tablet Take 1 tablet (500 mg total) by mouth 2 (two) times daily with a meal. 60 tablet 5  . oxybutynin (DITROPAN-XL) 10 MG 24 hr tablet TAKE (1) TABLET BY MOUTH AT BEDTIME. (Patient taking differently: Take 10 mg by mouth at bedtime. ) 90 tablet 0  . propranolol (INDERAL) 10 MG tablet Take 1 tablet (10 mg total)  by mouth 3 (three) times daily. 270 tablet 0  . QUEtiapine (SEROQUEL) 100 MG tablet Take 1 tablet (100 mg total) by mouth at bedtime. 90 tablet 0  . [DISCONTINUED] budesonide-formoterol (SYMBICORT) 160-4.5 MCG/ACT inhaler Inhale 2 puffs into the lungs 2 (two) times daily.    . [DISCONTINUED] montelukast (SINGULAIR) 10 MG tablet Take 1 tablet (10 mg total) by mouth at bedtime. 30 tablet 3   Social History   Socioeconomic History  . Marital status: Married    Spouse name: Legrand Como  . Number of children: 4  . Years of education: 8  . Highest education level: Not on file  Occupational History  . Occupation:  unem    Comment: waitress  Tobacco Use  . Smoking status: Never Smoker  . Smokeless tobacco: Never Used  Substance and Sexual Activity  . Alcohol use: Yes    Alcohol/week: 1.0 standard drinks    Types: 1 Cans of beer per week    Comment: occ.  . Drug use: No    Comment: none in 7 years  . Sexual activity: Not Currently    Birth control/protection: Surgical  Other Topics Concern  . 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 Difficulty of Paying Living Expenses:   Food Insecurity:   . Worried About Charity fundraiser in the Last Year:   . Arboriculturist in the Last Year:   Transportation Needs:   . Film/video editor (Medical):   Marland Kitchen Lack of Transportation (Non-Medical):   Physical Activity:   . Days of Exercise per Week:   . Minutes of Exercise per Session:   Stress:   . Feeling of Stress :   Social Connections:   . Frequency of Communication with Friends and Family:   . Frequency of Social Gatherings with Friends and Family:   . Attends Religious Services:   . Active Member of Clubs or Organizations:   . Attends Archivist Meetings:   Marland Kitchen Marital Status:   Intimate Partner Violence:   . Fear of Current or Ex-Partner:   . Emotionally Abused:   Marland Kitchen Physically Abused:   . Sexually Abused:    Family History  Problem Relation Age of Onset  . Depression Mother   . Hyperlipidemia Mother   . Learning disabilities Mother   . Alcohol abuse Mother 41  . Alcohol abuse Father   . Arthritis Father   . Cancer Father   . Heart disease Father   . Hypertension Father   . Learning disabilities Father   . Alcohol abuse Sister        RECOVERED  . Alcohol abuse Brother   . Alcohol abuse Brother   . Alcohol abuse Brother      OBJECTIVE:  Vitals:   07/20/19 1237  BP: 130/86  Pulse: 80  Resp: 16  Temp: (!) 97.5 F (36.4 C)  SpO2: 95%    General appearance: ALERT; in no acute distress.  Head: NCAT Lungs: Normal respiratory effort CV: pedal pulses 2+ bilaterally. Cap refill < 2 seconds Musculoskeletal: ecchymosis, swelling, very tender to palpation to the lateral aspect of the left foot Inspection: Skin warm, dry, clear and intact.  Palpation: Nontender  to palpation ROM: FROM active and passive Strength: Skin: warm and dry Neurologic: Ambulates without difficulty; Sensation intact about the upper/ lower extremities Psychological: alert and cooperative; normal mood and affect  DIAGNOSTIC STUDIES:  DG Foot Complete Left  Result Date: 07/20/2019 CLINICAL DATA:  Injured foot 3 days ago. Persistent pain and bruising along the lateral aspect of the foot. EXAM: LEFT FOOT - COMPLETE 3+ VIEW COMPARISON:  None. FINDINGS: The joint spaces are maintained.  No arthropathic findings. There is a mildly displaced transverse fracture through the distal aspect of the proximal phalanx of the fifth toe. No definite intra-articular involvement. IMPRESSION: Mildly displaced fracture involving the proximal phalanx of the fifth toe. Electronically Signed   By: Marijo Sanes M.D.   On: 07/20/2019 12:56     ASSESSMENT & PLAN:  1. Left foot pain   2. Closed displaced fracture of proximal phalanx of lesser toe of left foot, initial encounter    Meds ordered this encounter  Medications  . HYDROcodone-acetaminophen (NORCO/VICODIN) 5-325 MG tablet    Sig: Take 1 tablet by mouth every 4 (four) hours as needed for up to 5 days for moderate pain or severe pain.    Dispense:  12 tablet    Refill:  0    Order Specific Question:   Supervising Provider    Answer:   Chase Picket [[id number]]   Prescribed Norco Continue conservative management of rest, ice, and gentle stretches Take naproxen as needed for pain relief (may cause  abdominal discomfort, ulcers, and GI bleeds avoid taking with other NSAIDs) Take cyclobenzaprine at nighttime for symptomatic relief. Avoid driving or operating heavy machinery while using medication. Follow up with PCP if symptoms persist Return or go to the ER if you have any new or worsening symptoms (fever, chills, chest pain, abdominal pain, changes in bowel or bladder habits, pain radiating into lower legs.  [place] Controlled Substances Registry consulted for this patient. I feel the risk/benefit ratio today is favorable for proceeding with this prescription for a controlled substance. Medication sedation precautions given.  Reviewed expectations re: course of current medical issues. Questions answered. Outlined signs and symptoms indicating need for more acute intervention. Patient verbalized understanding. After Visit Summary given.       Faustino Congress, NP 07/20/19 1351

## 2019-07-21 IMAGING — US ULTRASOUND ABDOMEN LIMITED
1 series · 13 of 25 positions shown · non-contrast
Comparison: 06/06/2017

Addendum:
CLINICAL DATA: Abdominal distension, history hypertension, HIV
Normally distended without stones or wall thickening. No
pericholecystic fluid or sonographic Murphy sign.

EXAM:
ULTRASOUND ABDOMEN LIMITED RIGHT UPPER QUADRANT
HISTORY: Abdominal distention and bloating for 6 months

[Series 1: ultrasound abdomen limited · 0.19mm/px · 13 of 56 slices shown]
[im 1/56]
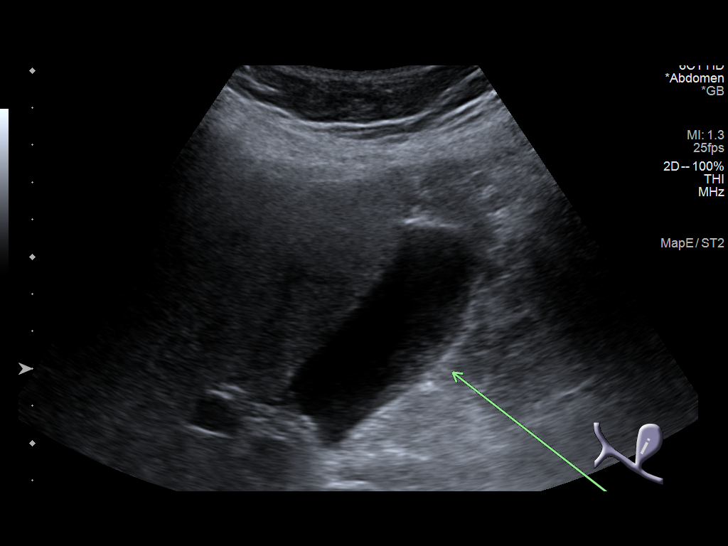
[im 5/56]
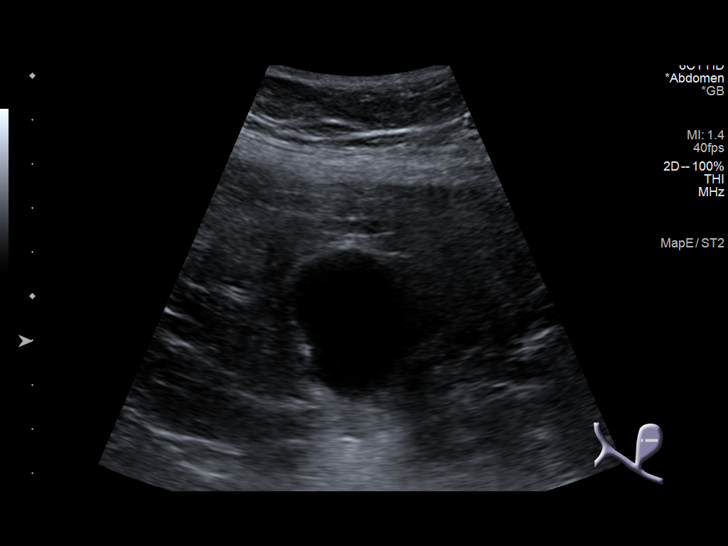
[im 10/56]
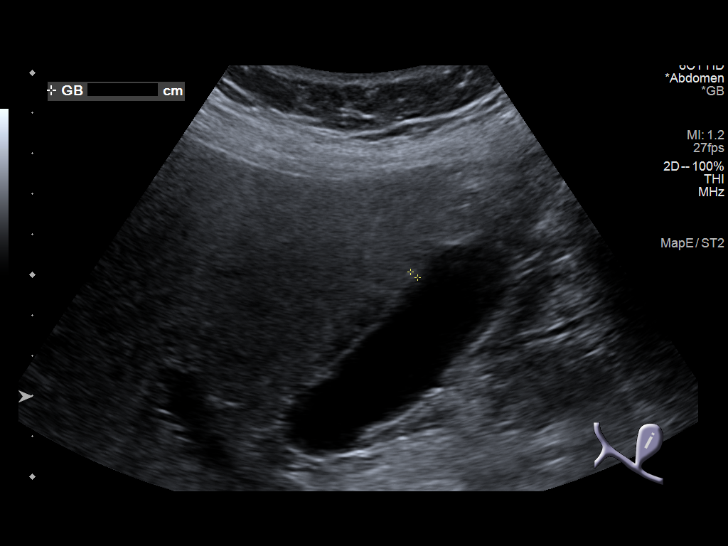
[im 14/56]
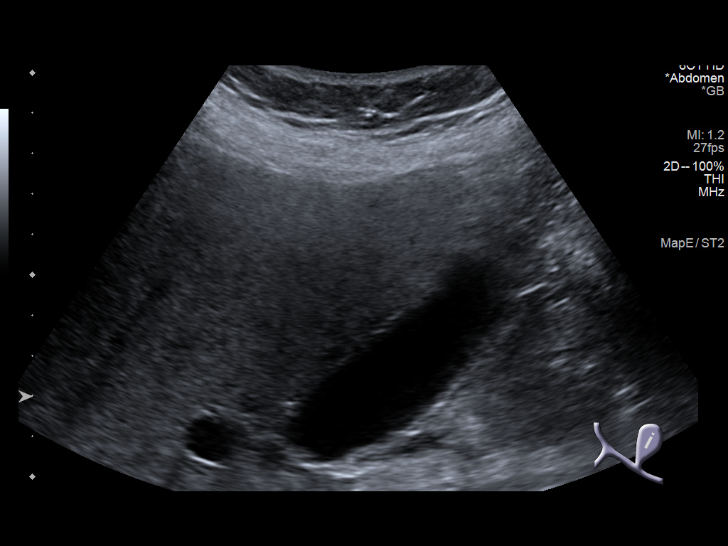
[im 19/56]
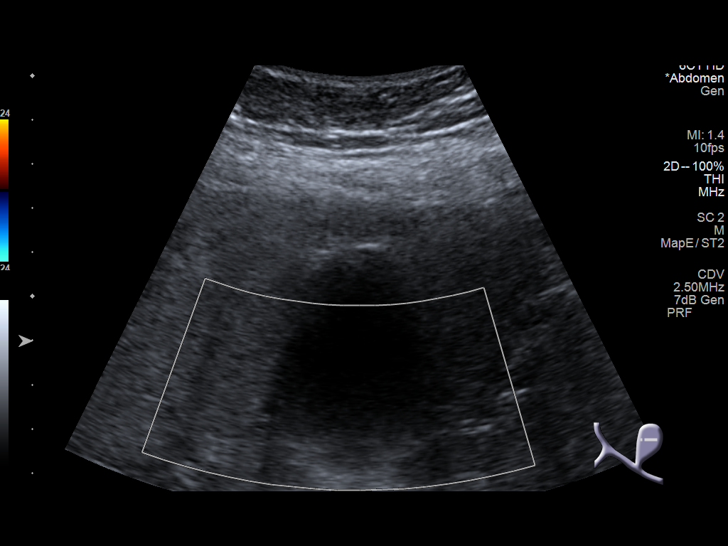
[im 23/56]
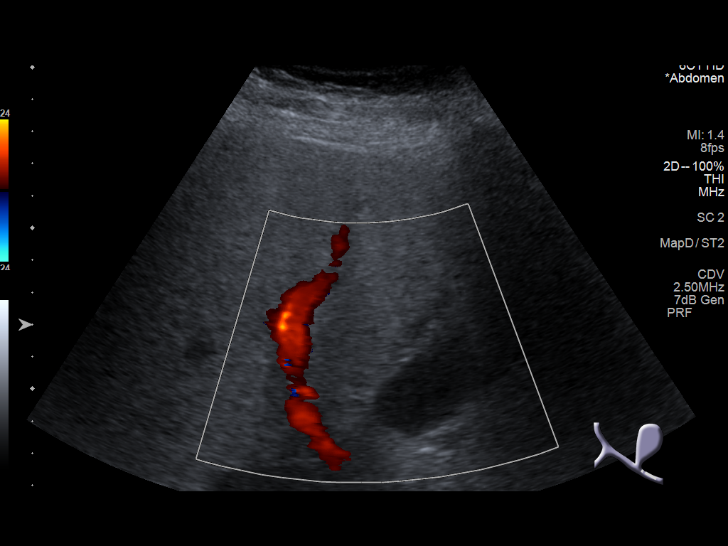
[im 28/56]
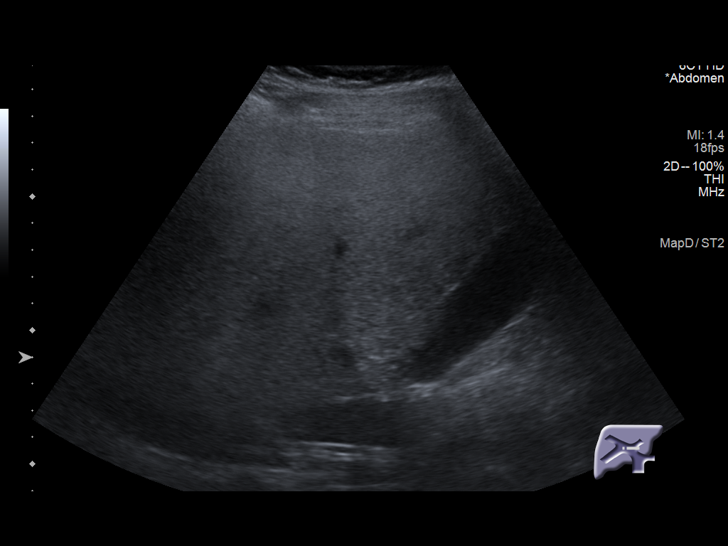
[im 33/56]
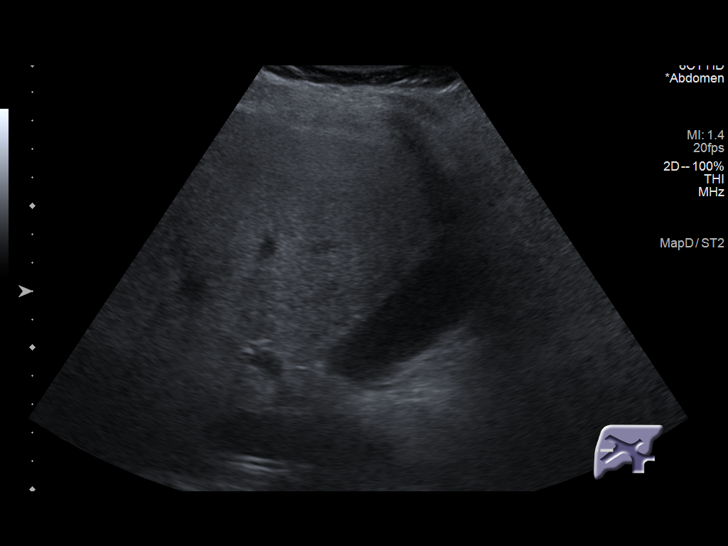
[im 37/56]
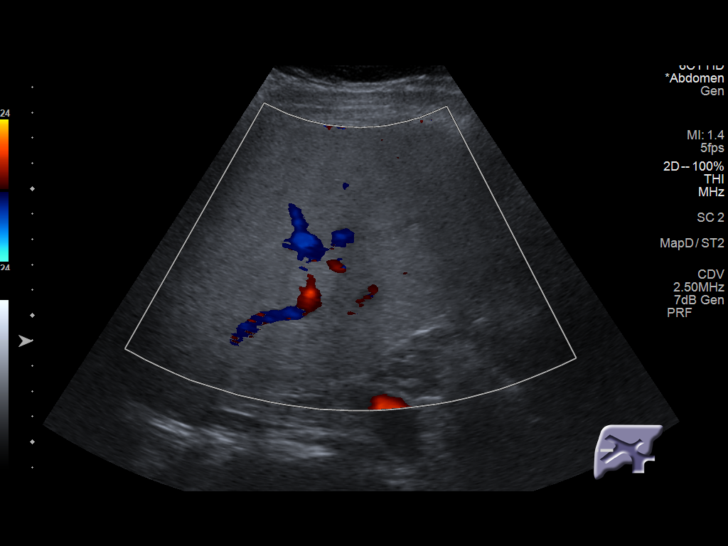
[im 42/56]
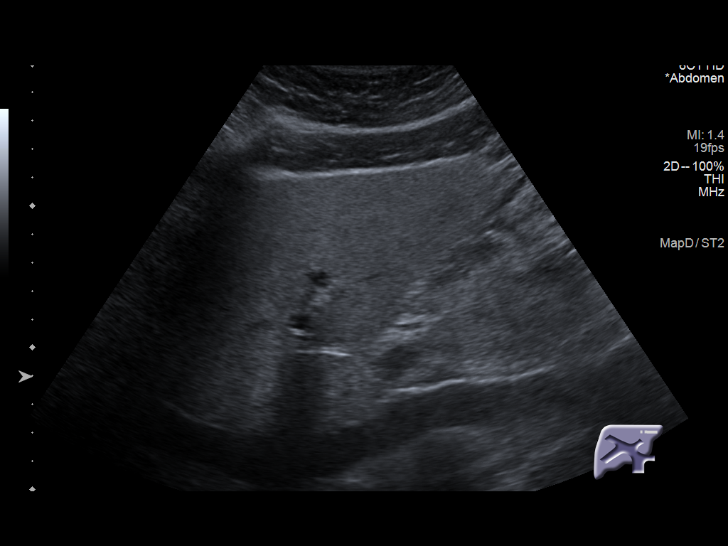
[im 46/56]
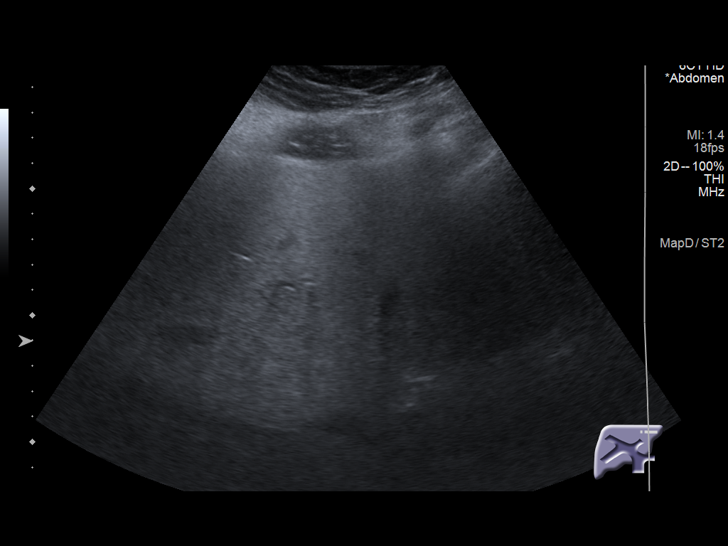
[im 51/56]
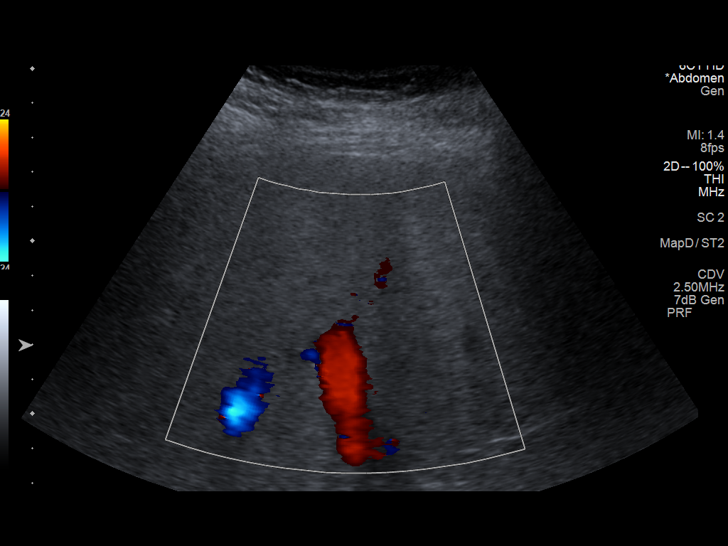
[im 56/56]
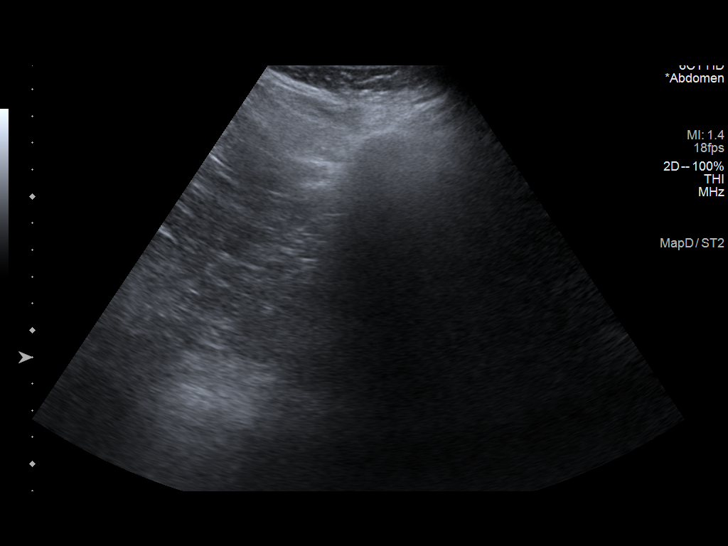

[13 of 25 positions shown; findings below may reference images not displayed]

FINDINGS: Gallbladder:

Small amount of dependent sludge within gallbladder. Normally
distended without stones or wall thickening. No pericholecystic
fluid or sonographic Murphy sign.

Common bile duct:

Diameter: 3 mm diameter, normal; only a short segment at the porta
hepatis visualized

Liver:

Echogenic parenchyma, likely fatty infiltration though this can be
seen with cirrhosis and certain infiltrative disorders. No focal
hepatic mass or nodularity. Portal vein is patent on color Doppler
imaging with normal direction of blood flow towards the liver.

No RIGHT upper quadrant free fluid
IMPRESSION: Probable fatty infiltration of liver as above.

Small amount of sludge within gallbladder.

Otherwise negative exam.

ADDENDUM:
Patient returned for repeat imaging of the spleen.

Spleen measures 11.6 cm in length with a calculated volume of 455
mL, minimally prominent.

No focal splenic lesion identified.

Imaging of the site of patient's symptoms in the LEFT upper quadrant
shows no focal sonographic abnormalities.

Remainder of report as previously dictated.

*** End of Addendum ***

## 2019-07-24 DIAGNOSIS — S92512D Displaced fracture of proximal phalanx of left lesser toe(s), subsequent encounter for fracture with routine healing: Secondary | ICD-10-CM | POA: Diagnosis not present

## 2019-07-25 DIAGNOSIS — G4733 Obstructive sleep apnea (adult) (pediatric): Secondary | ICD-10-CM | POA: Diagnosis not present

## 2019-07-26 DIAGNOSIS — S92512D Displaced fracture of proximal phalanx of left lesser toe(s), subsequent encounter for fracture with routine healing: Secondary | ICD-10-CM | POA: Diagnosis not present

## 2019-07-26 DIAGNOSIS — M7061 Trochanteric bursitis, right hip: Secondary | ICD-10-CM | POA: Diagnosis not present
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
## 2019-07-31 ENCOUNTER — Telehealth: Payer: Medicaid Other | Admitting: Psychiatry

## 2019-07-31 DIAGNOSIS — G4733 Obstructive sleep apnea (adult) (pediatric): Secondary | ICD-10-CM | POA: Diagnosis not present

## 2019-08-01 DIAGNOSIS — N393 Stress incontinence (female) (male): Secondary | ICD-10-CM | POA: Diagnosis not present

## 2019-08-01 DIAGNOSIS — R32 Unspecified urinary incontinence: Secondary | ICD-10-CM | POA: Diagnosis not present

## 2019-08-06 ENCOUNTER — Telehealth (INDEPENDENT_AMBULATORY_CARE_PROVIDER_SITE_OTHER): Payer: Self-pay | Admitting: Internal Medicine

## 2019-08-06 ENCOUNTER — Telehealth (HOSPITAL_COMMUNITY): Payer: Medicaid Other | Admitting: Psychiatry

## 2019-08-06 ENCOUNTER — Other Ambulatory Visit: Payer: Self-pay

## 2019-08-06 ENCOUNTER — Telehealth (HOSPITAL_COMMUNITY): Payer: Self-pay | Admitting: Psychiatry

## 2019-08-06 NOTE — Telephone Encounter (Signed)
Refill sent to pharmacy. Last OV in 2019-will need OV before additional refills.

## 2019-08-06 NOTE — Telephone Encounter (Signed)
Sent link for video visit through [place]. Patient did not sign in. Called the patient  for appointment scheduled today. The patient did not answer the phone. Left voice message to contact the office.

## 2019-08-17 DIAGNOSIS — F411 Generalized anxiety disorder: Secondary | ICD-10-CM | POA: Diagnosis not present

## 2019-08-17 DIAGNOSIS — F331 Major depressive disorder, recurrent, moderate: Secondary | ICD-10-CM | POA: Diagnosis not present

## 2019-08-17 DIAGNOSIS — B20 Human immunodeficiency virus [HIV] disease: Secondary | ICD-10-CM | POA: Diagnosis not present

## 2019-08-17 DIAGNOSIS — F4310 Post-traumatic stress disorder, unspecified: Secondary | ICD-10-CM | POA: Diagnosis not present

## 2019-08-17 DIAGNOSIS — B370 Candidal stomatitis: Secondary | ICD-10-CM | POA: Diagnosis not present

## 2019-08-17 DIAGNOSIS — B379 Candidiasis, unspecified: Secondary | ICD-10-CM | POA: Diagnosis not present

## 2019-08-17 DIAGNOSIS — F339 Major depressive disorder, recurrent, unspecified: Secondary | ICD-10-CM | POA: Diagnosis not present

## 2019-08-17 DIAGNOSIS — F410 Panic disorder [episodic paroxysmal anxiety] without agoraphobia: Secondary | ICD-10-CM | POA: Diagnosis not present

## 2019-08-17 DIAGNOSIS — E782 Mixed hyperlipidemia: Secondary | ICD-10-CM | POA: Diagnosis not present

## 2019-08-17 DIAGNOSIS — F5101 Primary insomnia: Secondary | ICD-10-CM | POA: Diagnosis not present

## 2019-08-17 DIAGNOSIS — F419 Anxiety disorder, unspecified: Secondary | ICD-10-CM | POA: Diagnosis not present

## 2019-08-20 DIAGNOSIS — Z029 Encounter for administrative examinations, unspecified: Secondary | ICD-10-CM

## 2019-08-22 DIAGNOSIS — N951 Menopausal and female climacteric states: Secondary | ICD-10-CM | POA: Diagnosis not present

## 2019-08-22 DIAGNOSIS — N3281 Overactive bladder: Secondary | ICD-10-CM | POA: Diagnosis not present

## 2019-08-22 DIAGNOSIS — K760 Fatty (change of) liver, not elsewhere classified: Secondary | ICD-10-CM | POA: Diagnosis not present

## 2019-08-22 DIAGNOSIS — I10 Essential (primary) hypertension: Secondary | ICD-10-CM | POA: Diagnosis not present

## 2019-08-22 DIAGNOSIS — F410 Panic disorder [episodic paroxysmal anxiety] without agoraphobia: Secondary | ICD-10-CM | POA: Diagnosis not present

## 2019-08-22 DIAGNOSIS — K219 Gastro-esophageal reflux disease without esophagitis: Secondary | ICD-10-CM | POA: Diagnosis not present

## 2019-08-22 DIAGNOSIS — E782 Mixed hyperlipidemia: Secondary | ICD-10-CM | POA: Diagnosis not present

## 2019-08-22 DIAGNOSIS — J4520 Mild intermittent asthma, uncomplicated: Secondary | ICD-10-CM | POA: Diagnosis not present

## 2019-08-22 DIAGNOSIS — B20 Human immunodeficiency virus [HIV] disease: Secondary | ICD-10-CM | POA: Diagnosis not present

## 2019-08-22 DIAGNOSIS — F411 Generalized anxiety disorder: Secondary | ICD-10-CM | POA: Diagnosis not present

## 2019-08-22 DIAGNOSIS — G4700 Insomnia, unspecified: Secondary | ICD-10-CM | POA: Diagnosis not present

## 2019-08-27 DIAGNOSIS — G4733 Obstructive sleep apnea (adult) (pediatric): Secondary | ICD-10-CM | POA: Diagnosis not present

## 2019-08-30 DIAGNOSIS — R32 Unspecified urinary incontinence: Secondary | ICD-10-CM | POA: Diagnosis not present

## 2019-08-30 DIAGNOSIS — G4733 Obstructive sleep apnea (adult) (pediatric): Secondary | ICD-10-CM | POA: Diagnosis not present

## 2019-08-30 DIAGNOSIS — N393 Stress incontinence (female) (male): Secondary | ICD-10-CM | POA: Diagnosis not present

## 2019-09-27 DIAGNOSIS — G4733 Obstructive sleep apnea (adult) (pediatric): Secondary | ICD-10-CM | POA: Diagnosis not present

## 2019-09-30 DIAGNOSIS — G4733 Obstructive sleep apnea (adult) (pediatric): Secondary | ICD-10-CM | POA: Diagnosis not present

## 2019-10-01 DIAGNOSIS — N393 Stress incontinence (female) (male): Secondary | ICD-10-CM | POA: Diagnosis not present

## 2019-10-01 DIAGNOSIS — R32 Unspecified urinary incontinence: Secondary | ICD-10-CM | POA: Diagnosis not present

## 2019-10-04 ENCOUNTER — Ambulatory Visit (INDEPENDENT_AMBULATORY_CARE_PROVIDER_SITE_OTHER): Payer: Self-pay | Admitting: Gastroenterology

## 2019-10-31 DIAGNOSIS — G4733 Obstructive sleep apnea (adult) (pediatric): Secondary | ICD-10-CM | POA: Diagnosis not present

## 2019-10-31 DIAGNOSIS — R32 Unspecified urinary incontinence: Secondary | ICD-10-CM | POA: Diagnosis not present

## 2019-10-31 DIAGNOSIS — N393 Stress incontinence (female) (male): Secondary | ICD-10-CM | POA: Diagnosis not present

## 2019-11-30 DIAGNOSIS — R32 Unspecified urinary incontinence: Secondary | ICD-10-CM | POA: Diagnosis not present

## 2019-11-30 DIAGNOSIS — G4733 Obstructive sleep apnea (adult) (pediatric): Secondary | ICD-10-CM | POA: Diagnosis not present

## 2019-11-30 DIAGNOSIS — N393 Stress incontinence (female) (male): Secondary | ICD-10-CM | POA: Diagnosis not present

## 2019-12-04 DIAGNOSIS — Z23 Encounter for immunization: Secondary | ICD-10-CM | POA: Diagnosis not present

## 2019-12-24 DIAGNOSIS — J4520 Mild intermittent asthma, uncomplicated: Secondary | ICD-10-CM | POA: Diagnosis not present

## 2019-12-24 DIAGNOSIS — B379 Candidiasis, unspecified: Secondary | ICD-10-CM | POA: Diagnosis not present

## 2019-12-24 DIAGNOSIS — N393 Stress incontinence (female) (male): Secondary | ICD-10-CM | POA: Diagnosis not present

## 2019-12-24 DIAGNOSIS — R3981 Functional urinary incontinence: Secondary | ICD-10-CM | POA: Diagnosis not present

## 2019-12-24 DIAGNOSIS — K219 Gastro-esophageal reflux disease without esophagitis: Secondary | ICD-10-CM | POA: Diagnosis not present

## 2019-12-24 DIAGNOSIS — F339 Major depressive disorder, recurrent, unspecified: Secondary | ICD-10-CM | POA: Diagnosis not present

## 2019-12-24 DIAGNOSIS — I10 Essential (primary) hypertension: Secondary | ICD-10-CM | POA: Diagnosis not present

## 2019-12-24 DIAGNOSIS — R1030 Lower abdominal pain, unspecified: Secondary | ICD-10-CM | POA: Diagnosis not present

## 2019-12-24 DIAGNOSIS — N3281 Overactive bladder: Secondary | ICD-10-CM | POA: Diagnosis not present

## 2019-12-24 DIAGNOSIS — B20 Human immunodeficiency virus [HIV] disease: Secondary | ICD-10-CM | POA: Diagnosis not present

## 2019-12-24 DIAGNOSIS — F4310 Post-traumatic stress disorder, unspecified: Secondary | ICD-10-CM | POA: Diagnosis not present

## 2019-12-24 DIAGNOSIS — N760 Acute vaginitis: Secondary | ICD-10-CM | POA: Diagnosis not present

## 2019-12-27 DIAGNOSIS — J45909 Unspecified asthma, uncomplicated: Secondary | ICD-10-CM | POA: Diagnosis not present

## 2019-12-27 DIAGNOSIS — F411 Generalized anxiety disorder: Secondary | ICD-10-CM | POA: Diagnosis not present

## 2019-12-27 DIAGNOSIS — K760 Fatty (change of) liver, not elsewhere classified: Secondary | ICD-10-CM | POA: Diagnosis not present

## 2019-12-27 DIAGNOSIS — E6609 Other obesity due to excess calories: Secondary | ICD-10-CM | POA: Diagnosis not present

## 2019-12-27 DIAGNOSIS — Z6837 Body mass index (BMI) 37.0-37.9, adult: Secondary | ICD-10-CM | POA: Diagnosis not present

## 2019-12-27 DIAGNOSIS — E782 Mixed hyperlipidemia: Secondary | ICD-10-CM | POA: Diagnosis not present

## 2019-12-27 DIAGNOSIS — N3281 Overactive bladder: Secondary | ICD-10-CM | POA: Diagnosis not present

## 2019-12-27 DIAGNOSIS — G4700 Insomnia, unspecified: Secondary | ICD-10-CM | POA: Diagnosis not present

## 2019-12-27 DIAGNOSIS — K219 Gastro-esophageal reflux disease without esophagitis: Secondary | ICD-10-CM | POA: Diagnosis not present

## 2019-12-27 DIAGNOSIS — I10 Essential (primary) hypertension: Secondary | ICD-10-CM | POA: Diagnosis not present

## 2019-12-27 DIAGNOSIS — B20 Human immunodeficiency virus [HIV] disease: Secondary | ICD-10-CM | POA: Diagnosis not present

## 2019-12-28 IMAGING — CR PORTABLE CHEST - 1 VIEW
1 series · 1 of 1 positions shown · non-contrast
Comparison: 11/24/2017 and earlier.

CLINICAL DATA: Progressively worsening cough, shortness of breath
and nausea/vomiting which began approximately 1 week ago.

EXAM:
PORTABLE CHEST 1 VIEW

[portable]
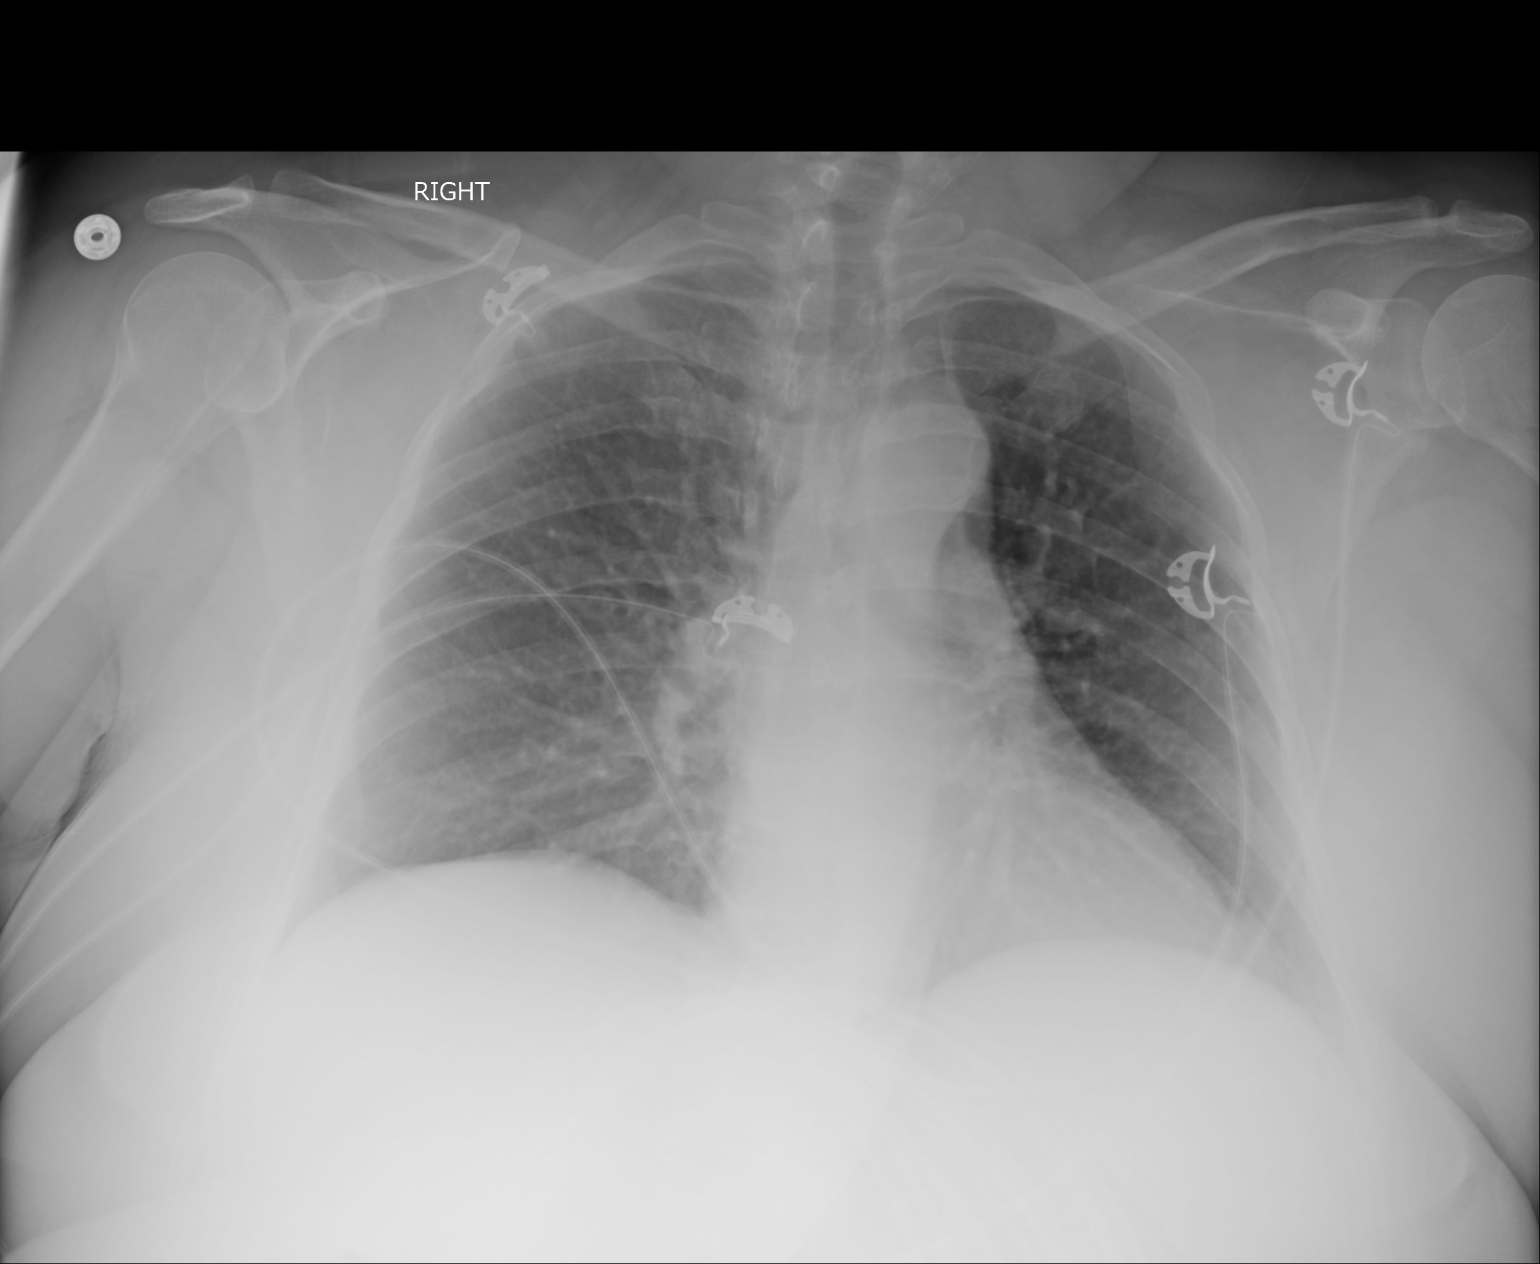

[1 of 1 positions shown; findings below may reference images not displayed]

FINDINGS: Suboptimal inspiration accounts for crowded bronchovascular
markings, especially in the bases, and accentuates the cardiac
silhouette. Taking this into account, cardiomediastinal silhouette
unremarkable and unchanged. Lungs clear. No confluent or
ground-glass airspace consolidation. No pleural effusions. Normal
pulmonary vascularity.
IMPRESSION: Suboptimal inspiration. No acute cardiopulmonary disease.

## 2019-12-31 DIAGNOSIS — G4733 Obstructive sleep apnea (adult) (pediatric): Secondary | ICD-10-CM | POA: Diagnosis not present

## 2020-01-02 DIAGNOSIS — R32 Unspecified urinary incontinence: Secondary | ICD-10-CM | POA: Diagnosis not present

## 2020-01-02 DIAGNOSIS — N393 Stress incontinence (female) (male): Secondary | ICD-10-CM | POA: Diagnosis not present

## 2020-01-03 DIAGNOSIS — G4700 Insomnia, unspecified: Secondary | ICD-10-CM | POA: Diagnosis not present

## 2020-01-03 DIAGNOSIS — F411 Generalized anxiety disorder: Secondary | ICD-10-CM | POA: Diagnosis not present

## 2020-01-03 DIAGNOSIS — L209 Atopic dermatitis, unspecified: Secondary | ICD-10-CM | POA: Diagnosis not present

## 2020-01-30 DIAGNOSIS — R32 Unspecified urinary incontinence: Secondary | ICD-10-CM | POA: Diagnosis not present

## 2020-01-30 DIAGNOSIS — N393 Stress incontinence (female) (male): Secondary | ICD-10-CM | POA: Diagnosis not present

## 2020-01-30 DIAGNOSIS — G4733 Obstructive sleep apnea (adult) (pediatric): Secondary | ICD-10-CM | POA: Diagnosis not present

## 2020-02-11 ENCOUNTER — Other Ambulatory Visit (INDEPENDENT_AMBULATORY_CARE_PROVIDER_SITE_OTHER): Payer: Self-pay

## 2020-02-11 NOTE — Telephone Encounter (Signed)
Patient not seen in clinic in >1 year, was asked to schedule appointment by Thayer Headings during her last refill request. Will need to make appointment for further refills.  Maylon Peppers, MD Gastroenterology and Hepatology [facility]

## 2020-02-13 ENCOUNTER — Other Ambulatory Visit (INDEPENDENT_AMBULATORY_CARE_PROVIDER_SITE_OTHER): Payer: Self-pay | Admitting: *Deleted

## 2020-02-13 DIAGNOSIS — K219 Gastro-esophageal reflux disease without esophagitis: Secondary | ICD-10-CM

## 2020-02-13 MED ORDER — FAMOTIDINE 40 MG PO TABS: ORAL_TABLET | ORAL | 0 refills | Status: DC

## 2020-02-13 NOTE — Telephone Encounter (Signed)
Will send refill for one month. Patient needs an appointment with [place] or with PCP for further refills

## 2020-03-01 DIAGNOSIS — G4733 Obstructive sleep apnea (adult) (pediatric): Secondary | ICD-10-CM | POA: Diagnosis not present

## 2020-03-04 DIAGNOSIS — R32 Unspecified urinary incontinence: Secondary | ICD-10-CM | POA: Diagnosis not present

## 2020-04-01 DIAGNOSIS — G4733 Obstructive sleep apnea (adult) (pediatric): Secondary | ICD-10-CM | POA: Diagnosis not present

## 2020-04-02 DIAGNOSIS — R32 Unspecified urinary incontinence: Secondary | ICD-10-CM | POA: Diagnosis not present

## 2020-04-04 ENCOUNTER — Ambulatory Visit
Admission: EM | Admit: 2020-04-04 | Discharge: 2020-04-04 | Disposition: A | Discharge status: Home / Self Care | Payer: Medicaid Other | Attending: Family Medicine | Admitting: Family Medicine

## 2020-04-04 ENCOUNTER — Other Ambulatory Visit: Payer: Self-pay

## 2020-04-04 ENCOUNTER — Encounter: Payer: Self-pay | Admitting: Emergency Medicine

## 2020-04-04 DIAGNOSIS — K047 Periapical abscess without sinus: Secondary | ICD-10-CM

## 2020-04-04 DIAGNOSIS — M795 Residual foreign body in soft tissue: Secondary | ICD-10-CM | POA: Diagnosis not present

## 2020-04-04 DIAGNOSIS — M79644 Pain in right finger(s): Secondary | ICD-10-CM

## 2020-04-04 DIAGNOSIS — M545 Low back pain, unspecified: Secondary | ICD-10-CM | POA: Diagnosis not present

## 2020-04-04 DIAGNOSIS — K0889 Other specified disorders of teeth and supporting structures: Secondary | ICD-10-CM | POA: Diagnosis not present

## 2020-04-04 MED ORDER — AMOXICILLIN-POT CLAVULANATE 875-125 MG PO TABS: 1.0000 | ORAL_TABLET | Freq: Two times a day (BID) | ORAL | 0 refills | Status: AC

## 2020-04-04 NOTE — Discharge Instructions (Addendum)
We removed the glass from her finger  I have sent in Augmentin for you to take twice a day for 7 days for a dental infection  Follow up with this office or with primary care if symptoms are persisting.  Follow up in the ER for high fever, trouble swallowing, trouble breathing, other concerning symptoms.

## 2020-04-04 NOTE — ED Provider Notes (Signed)
[facility]    CSN: [id number] Arrival date & time: 04/04/20  1315      History   Chief Complaint Chief Complaint  Patient presents with  . glass in finger    HPI Chelsey Henson is a 50 y.o. female.   Reports that she was swiping on her phone and got a small piece of glass in her right thumb.  Reports that the area is tender to touch, bleeding is controlled.  Also reports that she has been having right lower dental pain for the last 2 weeks.  Reports that there is probably an infection to her right lower jaw.  Has had this issue in the past, and has had to be on antibiotics for this in the past.  Denies headache, cough, nausea, vomiting, diarrhea, rash, fever, other symptoms.  ROS per HPI  The history is provided by the patient.    Past Medical History:  Diagnosis Date  . Anemia   . Anxiety   . Asthma   . Depression   . Genital herpes   . GERD (gastroesophageal reflux disease)   . HIV (human immunodeficiency virus infection) ([place]) 11/24/2017  . HIV infection ([place]) 2008  . Hypertension   . Sleep apnea   . Substance abuse ([place])    RECOVERED 7 YEARS - COCAINE  . Vaginal Pap smear, abnormal     Patient Active Problem List   Diagnosis Date Noted  . Post traumatic stress disorder (PTSD) 04/30/2019  . Anxiety 04/30/2019  . Routine screening for STI (sexually transmitted infection) 10/27/2018  . Transaminitis 05/24/2017  . Gastroesophageal reflux disease without esophagitis 04/06/2017  . MDD (major depressive disorder), recurrent episode, moderate ([place]) 09/08/2016  . Cocaine use disorder, moderate, in sustained remission ([place]) 09/08/2016  . Abdominal pain 07/19/2016  . OSA (obstructive sleep apnea) 04/15/2016  . Asthma 04/15/2016  . HIV (human immunodeficiency virus infection) ([place]) 04/05/2016  . Essential hypertension 04/05/2016  . Chronic insomnia 04/05/2016    Past Surgical History:  Procedure Laterality Date  . ABDOMINAL HYSTERECTOMY     BENIGN  MASS  . CESAREAN SECTION    . ESOPHAGOGASTRODUODENOSCOPY N/A 06/30/2017   Procedure: ESOPHAGOGASTRODUODENOSCOPY (EGD);  Surgeon: Rogene Houston, MD;  [facility];  Service: Endoscopy;  Laterality: N/A;  2:00  . HERNIA REPAIR  2017    OB History    Gravida  6   Para  4   Term  4   Preterm      AB  2   Living  4     SAB  2   IAB      Ectopic      Multiple      Live Births               Home Medications    Prior to Admission medications   Medication Sig Start Date End Date Taking? Authorizing Provider  amoxicillin-clavulanate (AUGMENTIN) 875-125 MG tablet Take 1 tablet by mouth 2 (two) times daily for 7 days. 04/04/20 04/11/20 Yes Faustino Congress, NP  acyclovir (ZOVIRAX) 800 MG tablet TAKE 1 TABLET EVERY DAY. Patient taking differently: Take 800 mg by mouth at bedtime.  11/02/16   Raylene Everts, MD  albuterol (PROVENTIL HFA;VENTOLIN HFA) 108 (90 Base) MCG/ACT inhaler Inhale 2 puffs into the lungs every 4 (four) hours as needed for wheezing or shortness of breath. 07/09/17   Noemi Chapel, MD  albuterol (PROVENTIL) (2.5 MG/3ML) 0.083% nebulizer solution USE 1 VIAL IN NEBULIZER EVERY  6 HOURS AS NEEDED FOR SHORTNESS OF BREATH OR WHEEZING. 08/28/18   Thayer Headings, MD  buPROPion (WELLBUTRIN XL) 150 MG 24 hr tablet Take 1 tablet (150 mg total) by mouth every morning. 06/06/19   [place] Crane, MD  busPIRone (BUSPAR) 15 MG tablet Take 1 tablet (15 mg total) by mouth 2 (two) times daily. 06/06/19   [place] Crane, MD  escitalopram (LEXAPRO) 20 MG tablet TAKE (1) TABLET BY MOUTH ONCE DAILY. 06/06/19   [place] Crane, MD  estradiol (ESTRACE) 1 MG tablet Take 1 mg by mouth every evening.     [provider]  famotidine (PEPCID) 40 MG tablet TAKE (1) TABLET BY MOUTH TWICE DAILY. 02/13/20   Harvel Quale, MD  GENVOYA 150-150-200-10 MG TABS tablet TAKE ONE TABLET BY MOUTH ONCE DAILY. 06/15/19   Thayer Headings, MD  lisinopril-hydrochlorothiazide  (PRINZIDE,ZESTORETIC) 10-12.5 MG tablet Take 1 tablet by mouth daily. Patient taking differently: Take 1 tablet by mouth every evening.  11/16/16   Raylene Everts, MD  Melatonin 1 MG CAPS Take 2 mg by mouth at bedtime.    [provider]  naproxen (NAPROSYN) 500 MG tablet Take 1 tablet (500 mg total) by mouth 2 (two) times daily with a meal. 04/10/19   Sanjuana Kava, MD  oxybutynin (DITROPAN-XL) 10 MG 24 hr tablet TAKE (1) TABLET BY MOUTH AT BEDTIME. Patient taking differently: Take 10 mg by mouth at bedtime.  06/20/17   Raylene Everts, MD  propranolol (INDERAL) 10 MG tablet Take 1 tablet (10 mg total) by mouth 3 (three) times daily. 06/06/19   [place] Crane, MD  QUEtiapine (SEROQUEL) 100 MG tablet Take 1 tablet (100 mg total) by mouth at bedtime. 06/06/19   [place] Crane, MD  budesonide-formoterol [facility]) 160-4.5 MCG/ACT inhaler Inhale 2 puffs into the lungs 2 (two) times daily.  09/25/18  [provider]  montelukast (SINGULAIR) 10 MG tablet Take 1 tablet (10 mg total) by mouth at bedtime. 07/26/17 09/25/18  Thayer Headings, MD    Family History Family History  Problem Relation Age of Onset  . Depression Mother   . Hyperlipidemia Mother   . Learning disabilities Mother   . Alcohol abuse Mother 40  . Alcohol abuse Father   . Arthritis Father   . Cancer Father   . Heart disease Father   . Hypertension Father   . Learning disabilities Father   . Alcohol abuse Sister        RECOVERED  . Alcohol abuse Brother   . Alcohol abuse Brother   . Alcohol abuse Brother     Social History Social History   Tobacco Use  . Smoking status: Never Smoker  . Smokeless tobacco: Never Used  Vaping Use  . Vaping Use: Never used  Substance Use Topics  . Alcohol use: Yes    Alcohol/week: 1.0 standard drink    Types: 1 Cans of beer per week    Comment: occ.  . Drug use: No    Comment: none in 7 years     Allergies   Abilify [aripiprazole] and Codeine   Review of  Systems Review of Systems   Physical Exam Triage Vital Signs ED Triage Vitals  Enc Vitals Group     BP 04/04/20 1325 119/76     Pulse Rate 04/04/20 1325 88     Resp 04/04/20 1325 18     Temp 04/04/20 1325 98.5 F (36.9 C)     Temp Source 04/04/20 1325 Oral  SpO2 04/04/20 1325 94 %     Weight 04/04/20 1323 210 lb (95.3 kg)     Height --      Head Circumference --      Peak Flow --      Pain Score 04/04/20 1323 0     Pain Loc --      Pain Edu? --      Excl. in [place]? --    No data found.  Updated Vital Signs BP 119/76 (BP Location: Right Arm)   Pulse 88   Temp 98.5 F (36.9 C) (Oral)   Resp 18   Wt 210 lb (95.3 kg)   LMP 09/30/2014 (Approximate)   SpO2 94%   BMI 36.05 kg/m   Physical Exam Vitals and nursing note reviewed.  Constitutional:      General: She is not in acute distress.    Appearance: Normal appearance. She is well-developed and well-nourished. She is not ill-appearing.  HENT:     Head: Normocephalic and atraumatic.     Right Ear: Tympanic membrane, ear canal and external ear normal.     Left Ear: Tympanic membrane, ear canal and external ear normal.     Nose: Nose normal.     Mouth/Throat:     Mouth: Mucous membranes are moist.     Dentition: Dental tenderness and gingival swelling present.      Comments: Area of erythema, tenderness, pain. Missing teeth here as well. Eyes:     Conjunctiva/sclera: Conjunctivae normal.  Cardiovascular:     Rate and Rhythm: Normal rate and regular rhythm.     Heart sounds: No murmur heard.   Pulmonary:     Effort: Pulmonary effort is normal. No respiratory distress.     Breath sounds: Normal breath sounds.  Abdominal:     Palpations: Abdomen is soft.     Tenderness: There is no abdominal tenderness.  Musculoskeletal:        General: No edema.     Cervical back: Normal range of motion and neck supple.  Skin:    General: Skin is warm and dry.  Neurological:     Mental Status: She is alert.  Psychiatric:         Mood and Affect: Mood and affect normal.      UC Treatments / Results  Labs (all labs ordered are listed, but only abnormal results are displayed) Labs Reviewed - No data to display  EKG   Radiology No results found.  Procedures Foreign Body Removal  Date/Time: 04/04/2020 2:00 PM Performed by: Faustino Congress, NP Authorized by: Faustino Congress, NP   Consent:    Consent obtained:  Verbal   Consent given by:  Patient   Risks discussed:  Infection, bleeding and incomplete removal   Alternatives discussed:  No treatment Universal protocol:    Patient identity confirmed:  Verbally with patient Location:    Location:  Finger   Finger location:  R thumb   Depth:  Intradermal   Tendon involvement:  None Pre-procedure details:    Imaging:  None   Neurovascular status: intact   Anesthesia:    Anesthesia method:  None Procedure type:    Procedure complexity:  Simple Procedure details:    Localization method:  Finder needle   Removal mechanism:  Forceps   Foreign bodies recovered:  1   Description:  Very small shard of glass   Intact foreign body removal: yes   Post-procedure details:    Skin closure:  None  Procedure completion:  Tolerated well, no immediate complications   (including critical care time)  Medications Ordered in UC Medications - No data to display  Initial Impression / Assessment and Plan / UC Course  I have reviewed the triage vital signs and the nursing notes.  Pertinent labs & imaging results that were available during my care of the patient were reviewed by me and considered in my medical decision making (see chart for details).    Right thumb pain Dental pain Dental infection  Prescribed Augmentin twice daily x7 days Glass removed from right thumb Tolerated well Follow up with this office or with primary care if symptoms are persisting.  Follow up in the ER for high fever, trouble swallowing, trouble breathing, other  concerning symptoms.  Final Clinical Impressions(s) / UC Diagnoses   Final diagnoses:  Thumb pain, right  Pain, dental  Dental abscess   Discharge Instructions   None    ED Prescriptions    Medication Sig Dispense Auth. Provider   amoxicillin-clavulanate (AUGMENTIN) 875-125 MG tablet Take 1 tablet by mouth 2 (two) times daily for 7 days. 14 tablet Faustino Congress, NP     PDMP not reviewed this encounter.   Faustino Congress, NP 04/04/20 1402

## 2020-04-04 NOTE — ED Triage Notes (Signed)
Got a piece of glass in right thumb, screen was broken on phone.

## 2020-04-11 ENCOUNTER — Ambulatory Visit
Admission: EM | Admit: 2020-04-11 | Discharge: 2020-04-11 | Disposition: A | Discharge status: Home / Self Care | Payer: Medicaid Other | Attending: Emergency Medicine | Admitting: Emergency Medicine

## 2020-04-11 ENCOUNTER — Other Ambulatory Visit: Payer: Self-pay

## 2020-04-11 DIAGNOSIS — J069 Acute upper respiratory infection, unspecified: Secondary | ICD-10-CM

## 2020-04-11 DIAGNOSIS — R519 Headache, unspecified: Secondary | ICD-10-CM | POA: Diagnosis not present

## 2020-04-11 DIAGNOSIS — Z1152 Encounter for screening for COVID-19: Secondary | ICD-10-CM

## 2020-04-11 DIAGNOSIS — R197 Diarrhea, unspecified: Secondary | ICD-10-CM

## 2020-04-11 MED ORDER — BENZONATATE 100 MG PO CAPS
100.0000 mg | ORAL_CAPSULE | Freq: Three times a day (TID) | ORAL | 0 refills | Status: DC | PRN
Start: 2020-04-11 — End: 2020-05-28

## 2020-04-11 MED ORDER — PREDNISONE 10 MG PO TABS
20.0000 mg | ORAL_TABLET | Freq: Every day | ORAL | 0 refills | Status: DC
Start: 2020-04-11 — End: 2020-05-28

## 2020-04-11 MED ORDER — CETIRIZINE HCL 10 MG PO TABS: 10.0000 mg | ORAL_TABLET | Freq: Every day | ORAL | 0 refills | Status: DC

## 2020-04-11 NOTE — ED Provider Notes (Signed)
[place]   [id number] 04/11/20 Arrival Time: 1440   CC: COVID symptoms  SUBJECTIVE: History from: patient.  Chelsey Henson is a 50 y.o. female who presented to the urgent care for complaint of fever, cough, headache and diarrhea for the past few days.  Denies sick exposure to COVID, flu or strep.  Denies recent travel.  Has tried OTC medication without relief.  Denies alleviating or aggravating factors.  Denies previous symptoms in the past.   Denies fever, chills, fatigue, sinus pain, rhinorrhea, sore throat, SOB, wheezing, chest pain, nausea, changes in bowel or bladder habits.    ROS: As per HPI.  All other pertinent ROS negative.      Past Medical History:  Diagnosis Date  . Anemia   . Anxiety   . Asthma   . Depression   . Genital herpes   . GERD (gastroesophageal reflux disease)   . HIV (human immunodeficiency virus infection) ([place]) 11/24/2017  . HIV infection ([place]) 2008  . Hypertension   . Sleep apnea   . 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 Vaginal Pap smear, abnormal    Past Surgical History:  Procedure Laterality Date  . ABDOMINAL HYSTERECTOMY     BENIGN MASS  . CESAREAN SECTION    . ESOPHAGOGASTRODUODENOSCOPY N/A 06/30/2017   Procedure: ESOPHAGOGASTRODUODENOSCOPY (EGD);  Surgeon: Rogene Houston, MD;  Location: [facility];  Service: Endoscopy;  Laterality: N/A;  2:00  . HERNIA REPAIR  2017   Allergies  Allergen Reactions  . Abilify [Aripiprazole] Other (See Comments)    Tardive dyskinesia  . Codeine Itching   No current facility-administered medications on file prior to encounter.   Current Outpatient Medications on File Prior to Encounter  Medication Sig Dispense Refill  . acyclovir (ZOVIRAX) 800 MG tablet TAKE 1 TABLET EVERY DAY. (Patient taking differently: Take 800 mg by mouth at bedtime. ) 90 tablet 3  . albuterol (PROVENTIL HFA;VENTOLIN HFA) 108 (90 Base) MCG/ACT inhaler Inhale 2 puffs into the lungs every 4 (four)  hours as needed for wheezing or shortness of breath. 1 Inhaler 3  . albuterol (PROVENTIL) (2.5 MG/3ML) 0.083% nebulizer solution USE 1 VIAL IN NEBULIZER EVERY 6 HOURS AS NEEDED FOR SHORTNESS OF BREATH OR WHEEZING. 75 mL 0  . amoxicillin-clavulanate (AUGMENTIN) 875-125 MG tablet Take 1 tablet by mouth 2 (two) times daily for 7 days. 14 tablet 0  . buPROPion (WELLBUTRIN XL) 150 MG 24 hr tablet Take 1 tablet (150 mg total) by mouth every morning. 90 tablet 0  . busPIRone (BUSPAR) 15 MG tablet Take 1 tablet (15 mg total) by mouth 2 (two) times daily. 180 tablet 0  . escitalopram (LEXAPRO) 20 MG tablet TAKE (1) TABLET BY MOUTH ONCE DAILY. 90 tablet 0  . estradiol (ESTRACE) 1 MG tablet Take 1 mg by mouth every evening.     . famotidine (PEPCID) 40 MG tablet TAKE (1) TABLET BY MOUTH TWICE DAILY. 60 tablet 0  . GENVOYA 150-150-200-10 MG TABS tablet TAKE ONE TABLET BY MOUTH ONCE DAILY. 30 tablet 3  . lisinopril-hydrochlorothiazide (PRINZIDE,ZESTORETIC) 10-12.5 MG tablet Take 1 tablet by mouth daily. (Patient taking differently: Take 1 tablet by mouth every evening. ) 90 tablet 3  . Melatonin 1 MG CAPS Take 2 mg by mouth at bedtime.    . naproxen (NAPROSYN) 500 MG tablet Take 1 tablet (500 mg total) by mouth 2 (two) times daily with a meal. 60 tablet 5  . oxybutynin (DITROPAN-XL) 10  MG 24 hr tablet TAKE (1) TABLET BY MOUTH AT BEDTIME. (Patient taking differently: Take 10 mg by mouth at bedtime. ) 90 tablet 0  . propranolol (INDERAL) 10 MG tablet Take 1 tablet (10 mg total) by mouth 3 (three) times daily. 270 tablet 0  . QUEtiapine (SEROQUEL) 100 MG tablet Take 1 tablet (100 mg total) by mouth at bedtime. 90 tablet 0  . [DISCONTINUED] budesonide-formoterol (SYMBICORT) 160-4.5 MCG/ACT inhaler Inhale 2 puffs into the lungs 2 (two) times daily.    . [DISCONTINUED] montelukast (SINGULAIR) 10 MG tablet Take 1 tablet (10 mg total) by mouth at bedtime. 30 tablet 3   Social History   Socioeconomic History  .  Marital status: Married    Spouse name: Legrand Como  . Number of children: 4  . Years of education: 8  . Highest education level: Not on file  Occupational History  . 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 Smoking status: Never Smoker  . Smokeless tobacco: Never Used  Vaping Use  . Vaping Use: Never used  Substance and Sexual Activity  . Alcohol use: Yes    Alcohol/week: 1.0 standard drink    Types: 1 Cans of beer per week    Comment: occ.  . Drug use: No    Comment: none in 7 years  . Sexual activity: Not Currently    Birth control/protection: Surgical  Other Topics Concern  . 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 Depression Mother   . Hyperlipidemia Mother   . Learning disabilities Mother   . Alcohol abuse Mother 57  . Alcohol abuse Father   . Arthritis Father   . Cancer Father   . Heart disease Father   . Hypertension Father   . Learning disabilities Father   . Alcohol abuse Sister        RECOVERED  . Alcohol abuse Brother   . Alcohol abuse Brother   . Alcohol abuse Brother     OBJECTIVE:  Vitals:   04/11/20 1454  BP: 135/85  Pulse: 79  Resp: 18  Temp: 97.6 F (36.4 C)  SpO2: 96%     General appearance: alert; appears fatigued, but nontoxic; speaking in full sentences and tolerating own secretions HEENT: NCAT; Ears: EACs clear, TMs pearly gray; Eyes: PERRL.  EOM grossly intact. Sinuses: nontender; Nose: nares patent without rhinorrhea, Throat:  oropharynx clear, tonsils non erythematous or enlarged, uvula midline  Neck: supple without LAD Lungs: unlabored respirations, symmetrical air entry; cough: moderate; no respiratory distress; CTAB Heart: regular rate and rhythm.  Radial pulses 2+ symmetrical bilaterally Skin: warm and dry Psychological: alert and cooperative; normal mood and affect  LABS:  No results found for this or any previous visit (from the past 24 hour(s)).   ASSESSMENT & PLAN:  1. Encounter for screening for COVID-19   2. URI with cough and congestion   3. Acute nonintractable headache,  unspecified headache type   4. Diarrhea, unspecified type     Meds ordered this encounter  Medications  . cetirizine (ZYRTEC ALLERGY) 10 MG tablet    Sig: Take 1 tablet (10 mg total) by mouth daily.    Dispense:  30 tablet    Refill:  0  . predniSONE (DELTASONE) 10 MG tablet    Sig: Take 2 tablets (20 mg total) by mouth daily.    Dispense:  15 tablet    Refill:  0  . benzonatate (TESSALON) 100 MG capsule    Sig: Take 1 capsule (100 mg total) by mouth 3 (three) times daily as needed for cough.    Dispense:  30 capsule    Refill:  0    Discharge instructions  COVID testing ordered.  It will take between 2-7 days for test results.  Someone will contact you regarding abnormal results.    Get plenty of rest and push fluids Tessalon Perles prescribed for cough Zyrtec for nasal congestion, runny nose, and/or sore throat Prednisone was prescribed Use OTC medication like ibuprofen or Tylenol as needed for pain/fever Use OTC Imodium as needed for diarrhea Use medications daily for symptom relief Use OTC medications like ibuprofen or tylenol as needed fever or pain Call or go to the ED if you have any new or worsening symptoms such as fever, worsening cough, shortness of breath, chest tightness, chest pain, turning blue, changes in mental status, etc...   Reviewed expectations re: course of current medical issues.  Questions answered. Outlined signs and symptoms indicating need for more acute intervention. Patient verbalized understanding. After Visit Summary given.         Emerson Monte, [place] 04/11/20 [phone]

## 2020-04-11 NOTE — ED Triage Notes (Signed)
Pt presents with co fever, cough, headache and diarrhea for past few days

## 2020-04-11 NOTE — Discharge Instructions (Signed)
COVID testing ordered.  It will take between 2-7 days for test results.  Someone will contact you regarding abnormal results.    Get plenty of rest and push fluids Tessalon Perles prescribed for cough Zyrtec for nasal congestion, runny nose, and/or sore throat Prednisone was prescribed Use OTC medication like ibuprofen or Tylenol as needed for pain/fever Use OTC Imodium as needed for diarrhea Use medications daily for symptom relief Use OTC medications like ibuprofen or tylenol as needed fever or pain Call or go to the ED if you have any new or worsening symptoms such as fever, worsening cough, shortness of breath, chest tightness, chest pain, turning blue, changes in mental status, etc..Marland Kitchen

## 2020-04-13 LAB — COVID-19, FLU A+B NAA
Influenza A, NAA: NOT DETECTED
Influenza B, NAA: NOT DETECTED
SARS-CoV-2, NAA: NOT DETECTED

## 2020-04-21 DIAGNOSIS — K760 Fatty (change of) liver, not elsewhere classified: Secondary | ICD-10-CM | POA: Diagnosis not present

## 2020-04-21 DIAGNOSIS — E782 Mixed hyperlipidemia: Secondary | ICD-10-CM | POA: Diagnosis not present

## 2020-04-21 DIAGNOSIS — G4700 Insomnia, unspecified: Secondary | ICD-10-CM | POA: Diagnosis not present

## 2020-04-21 DIAGNOSIS — B20 Human immunodeficiency virus [HIV] disease: Secondary | ICD-10-CM | POA: Diagnosis not present

## 2020-04-21 DIAGNOSIS — K219 Gastro-esophageal reflux disease without esophagitis: Secondary | ICD-10-CM | POA: Diagnosis not present

## 2020-04-21 DIAGNOSIS — N3281 Overactive bladder: Secondary | ICD-10-CM | POA: Diagnosis not present

## 2020-04-21 DIAGNOSIS — J45909 Unspecified asthma, uncomplicated: Secondary | ICD-10-CM | POA: Diagnosis not present

## 2020-04-21 DIAGNOSIS — F411 Generalized anxiety disorder: Secondary | ICD-10-CM | POA: Diagnosis not present

## 2020-04-21 DIAGNOSIS — Z6837 Body mass index (BMI) 37.0-37.9, adult: Secondary | ICD-10-CM | POA: Diagnosis not present

## 2020-04-21 DIAGNOSIS — I10 Essential (primary) hypertension: Secondary | ICD-10-CM | POA: Diagnosis not present

## 2020-04-28 ENCOUNTER — Ambulatory Visit: Payer: Medicaid Other | Admitting: Nurse Practitioner

## 2020-04-29 DIAGNOSIS — G4733 Obstructive sleep apnea (adult) (pediatric): Secondary | ICD-10-CM | POA: Diagnosis not present

## 2020-04-29 DIAGNOSIS — R32 Unspecified urinary incontinence: Secondary | ICD-10-CM | POA: Diagnosis not present

## 2020-05-06 ENCOUNTER — Ambulatory Visit: Payer: Medicaid Other | Admitting: Nurse Practitioner

## 2020-05-17 ENCOUNTER — Other Ambulatory Visit: Payer: Self-pay

## 2020-05-17 ENCOUNTER — Ambulatory Visit
Admission: EM | Admit: 2020-05-17 | Discharge: 2020-05-17 | Disposition: A | Discharge status: Home / Self Care | Payer: Medicaid Other | Attending: Emergency Medicine | Admitting: Emergency Medicine

## 2020-05-17 ENCOUNTER — Encounter: Payer: Self-pay | Admitting: Emergency Medicine

## 2020-05-17 DIAGNOSIS — J019 Acute sinusitis, unspecified: Secondary | ICD-10-CM

## 2020-05-17 DIAGNOSIS — R0981 Nasal congestion: Secondary | ICD-10-CM | POA: Diagnosis not present

## 2020-05-17 MED ORDER — AMOXICILLIN-POT CLAVULANATE 875-125 MG PO TABS: 1.0000 | ORAL_TABLET | Freq: Two times a day (BID) | ORAL | 0 refills | Status: AC

## 2020-05-17 NOTE — ED Triage Notes (Signed)
Nasal congestion, cough x 1 week.

## 2020-05-17 NOTE — Discharge Instructions (Signed)
Get plenty of rest and push fluids Augmentin for sinus infection Use OTC zyrtec for nasal congestion, runny nose, and/or sore throat Use OTC flonase for nasal congestion and runny nose Use medications daily for symptom relief Use OTC medications like ibuprofen or tylenol as needed fever or pain Call or go to the ED if you have any new or worsening symptoms such as fever, worsening cough, shortness of breath, chest tightness, chest pain, turning blue, changes in mental status, etc..Marland Kitchen

## 2020-05-17 NOTE — ED Provider Notes (Signed)
[place]   [id number] 05/17/20 Arrival Time: 9292   CC: congestion and cough  SUBJECTIVE: History from: patient.  Chelsey Henson is a 50 y.o. female who presents with hoarse voice, sinus congestion, pressure, and cough x 1 week.  Denies sick exposure to COVID, flu or strep.  Has tried OTC medications without relief.  Symptoms are made worse time.  Reports previous symptoms in the past.   Denies fever, chills,  SOB, wheezing, chest pain, nausea, changes in bowel or bladder habits.     ROS: As per HPI.  All other pertinent ROS negative.     Past Medical History:  Diagnosis Date  . Anemia   . Anxiety   . Asthma   . Depression   . Genital herpes   . GERD (gastroesophageal reflux disease)   . HIV (human immunodeficiency virus infection) ([place]) 11/24/2017  . HIV infection ([place]) 2008  . Hypertension   . Sleep apnea   . 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 Vaginal Pap smear, abnormal    Past Surgical History:  Procedure Laterality Date  . ABDOMINAL HYSTERECTOMY     BENIGN MASS  . CESAREAN SECTION    . ESOPHAGOGASTRODUODENOSCOPY N/A 06/30/2017   Procedure: ESOPHAGOGASTRODUODENOSCOPY (EGD);  Surgeon: Rogene Houston, MD;  [facility];  Service: Endoscopy;  Laterality: N/A;  2:00  . HERNIA REPAIR  2017   Allergies  Allergen Reactions  . Abilify [Aripiprazole] Other (See Comments)    Tardive dyskinesia  . Codeine Itching   No current facility-administered medications on file prior to encounter.   Current Outpatient Medications on File Prior to Encounter  Medication Sig Dispense Refill  . acyclovir (ZOVIRAX) 800 MG tablet TAKE 1 TABLET EVERY DAY. (Patient taking differently: Take 800 mg by mouth at bedtime. ) 90 tablet 3  . albuterol (PROVENTIL HFA;VENTOLIN HFA) 108 (90 Base) MCG/ACT inhaler Inhale 2 puffs into the lungs every 4 (four) hours as needed for wheezing or shortness of breath. 1 Inhaler 3  . albuterol (PROVENTIL) (2.5  MG/3ML) 0.083% nebulizer solution USE 1 VIAL IN NEBULIZER EVERY 6 HOURS AS NEEDED FOR SHORTNESS OF BREATH OR WHEEZING. 75 mL 0  . benzonatate (TESSALON) 100 MG capsule Take 1 capsule (100 mg total) by mouth 3 (three) times daily as needed for cough. 30 capsule 0  . buPROPion (WELLBUTRIN XL) 150 MG 24 hr tablet Take 1 tablet (150 mg total) by mouth every morning. 90 tablet 0  . busPIRone (BUSPAR) 15 MG tablet Take 1 tablet (15 mg total) by mouth 2 (two) times daily. 180 tablet 0  . cetirizine (ZYRTEC ALLERGY) 10 MG tablet Take 1 tablet (10 mg total) by mouth daily. 30 tablet 0  . escitalopram (LEXAPRO) 20 MG tablet TAKE (1) TABLET BY MOUTH ONCE DAILY. 90 tablet 0  . estradiol (ESTRACE) 1 MG tablet Take 1 mg by mouth every evening.     . famotidine (PEPCID) 40 MG tablet TAKE (1) TABLET BY MOUTH TWICE DAILY. 60 tablet 0  . GENVOYA 150-150-200-10 MG TABS tablet TAKE ONE TABLET BY MOUTH ONCE DAILY. 30 tablet 3  . lisinopril-hydrochlorothiazide (PRINZIDE,ZESTORETIC) 10-12.5 MG tablet Take 1 tablet by mouth daily. (Patient taking differently: Take 1 tablet by mouth every evening. ) 90 tablet 3  . Melatonin 1 MG CAPS Take 2 mg by mouth at bedtime.    . naproxen (NAPROSYN) 500 MG tablet Take 1 tablet (500 mg total) by mouth 2 (two) times daily with  a meal. 60 tablet 5  . oxybutynin (DITROPAN-XL) 10 MG 24 hr tablet TAKE (1) TABLET BY MOUTH AT BEDTIME. (Patient taking differently: Take 10 mg by mouth at bedtime. ) 90 tablet 0  . predniSONE (DELTASONE) 10 MG tablet Take 2 tablets (20 mg total) by mouth daily. 15 tablet 0  . propranolol (INDERAL) 10 MG tablet Take 1 tablet (10 mg total) by mouth 3 (three) times daily. 270 tablet 0  . QUEtiapine (SEROQUEL) 100 MG tablet Take 1 tablet (100 mg total) by mouth at bedtime. 90 tablet 0  . [DISCONTINUED] budesonide-formoterol (SYMBICORT) 160-4.5 MCG/ACT inhaler Inhale 2 puffs into the lungs 2 (two) times daily.    . [DISCONTINUED] montelukast (SINGULAIR) 10 MG tablet  Take 1 tablet (10 mg total) by mouth at bedtime. 30 tablet 3   Social History   Socioeconomic History  . Marital status: Married    Spouse name: Legrand Como  . Number of children: 4  . Years of education: 8  . Highest education level: Not on file  Occupational History  . 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 Smoking status: Never Smoker  . Smokeless tobacco: Never Used  Vaping Use  . Vaping Use: Never used  Substance and Sexual Activity  . Alcohol use: Yes    Alcohol/week: 1.0 standard drink    Types: 1 Cans of beer per week    Comment: occ.  . Drug use: No    Comment: none in 7 years  . Sexual activity: Not Currently    Birth control/protection: Surgical  Other Topics Concern  . 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 Depression Mother   . Hyperlipidemia Mother   . Learning disabilities Mother   . Alcohol abuse Mother 62  . Alcohol abuse Father   . Arthritis Father   . Cancer Father   . Heart disease Father   . Hypertension Father   . Learning disabilities Father   . Alcohol abuse Sister        RECOVERED  . Alcohol abuse Brother   . Alcohol abuse Brother   . Alcohol abuse Brother     OBJECTIVE:  Vitals:   05/17/20 1237  BP: 132/83  Pulse: 94  Resp: 17  Temp: 97.6 F (36.4 C)  TempSrc: Tympanic  SpO2: 95%     General appearance: alert; appears fatigued, but nontoxic; speaking in full sentences and tolerating own secretions HEENT: NCAT; Ears: EACs  clear, TMs pearly gray; Eyes: PERRL.  EOM grossly intact. Sinuses: TTP; Nose: nares patent without rhinorrhea, Throat: oropharynx clear, tonsils non erythematous or enlarged, uvula midline  Neck: supple without LAD Lungs: unlabored respirations, symmetrical air entry; cough: absent; no respiratory distress; CTAB Heart: regular rate and rhythm.   Skin: warm and dry Psychological: alert and cooperative; normal mood and affect  ASSESSMENT & PLAN:  1. Sinus congestion   2. Acute non-recurrent sinusitis, unspecified  location     Meds ordered this encounter  Medications  . amoxicillin-clavulanate (AUGMENTIN) 875-125 MG tablet    Sig: Take 1 tablet by mouth every 12 (twelve) hours for 10 days.    Dispense:  20 tablet    Refill:  0    Order Specific Question:   Supervising Provider    Answer:   Raylene Everts [[id number]]   Get plenty of rest and push fluids Augmentin for sinus infection Use OTC zyrtec for nasal congestion, runny nose, and/or sore throat Use OTC flonase for nasal congestion and runny nose Use medications daily for symptom relief Use OTC medications like ibuprofen or tylenol as needed fever or pain Call or go to the ED if you have any new or worsening symptoms such as fever, worsening cough, shortness of breath, chest tightness, chest pain, turning blue, changes in mental status, etc...   Reviewed expectations re: course of current medical issues. Questions answered. Outlined signs and symptoms indicating need for more acute intervention. Patient verbalized understanding. After Visit Summary given.         Lestine Box, PA-C 05/17/20 1303

## 2020-05-27 DIAGNOSIS — K136 Irritative hyperplasia of oral mucosa: Secondary | ICD-10-CM | POA: Diagnosis not present

## 2020-05-28 ENCOUNTER — Other Ambulatory Visit: Payer: Self-pay

## 2020-05-28 ENCOUNTER — Ambulatory Visit: Payer: Medicaid Other | Admitting: Nurse Practitioner

## 2020-05-28 VITALS — BP 124/84 | HR 82 | Temp 98.6°F | Resp 20 | Ht 64.0 in | Wt 219.0 lb

## 2020-05-28 DIAGNOSIS — I10 Essential (primary) hypertension: Secondary | ICD-10-CM

## 2020-05-28 DIAGNOSIS — Z7689 Persons encountering health services in other specified circumstances: Secondary | ICD-10-CM | POA: Diagnosis not present

## 2020-05-28 DIAGNOSIS — F1421 Cocaine dependence, in remission: Secondary | ICD-10-CM | POA: Diagnosis not present

## 2020-05-28 DIAGNOSIS — F331 Major depressive disorder, recurrent, moderate: Secondary | ICD-10-CM

## 2020-05-28 DIAGNOSIS — R7401 Elevation of levels of liver transaminase levels: Secondary | ICD-10-CM

## 2020-05-28 DIAGNOSIS — B20 Human immunodeficiency virus [HIV] disease: Secondary | ICD-10-CM

## 2020-05-28 DIAGNOSIS — K219 Gastro-esophageal reflux disease without esophagitis: Secondary | ICD-10-CM | POA: Diagnosis not present
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
## 2020-05-28 NOTE — Assessment & Plan Note (Addendum)
-  takes [place] -saw Dr. Linus Salmons with infectious disease in Aug 2020 Lab Results  Component Value Date   CD4TCELL 25 (L) 04/13/2018   CD4TABS 590 04/13/2018   -She states she has appt with Dr. Linus Salmons in a few months

## 2020-05-28 NOTE — Assessment & Plan Note (Signed)
-  Dr. Juel Burrow labs from 04/16/20 show AST 68 and ALT 112 -will recheck with next set of labs

## 2020-05-28 NOTE — Patient Instructions (Signed)
Please have fasting labs drawn 2-3 days prior to your appointment so we can discuss the results during your office visit.

## 2020-05-28 NOTE — Assessment & Plan Note (Signed)
-  takes pepcid OTC PRN

## 2020-05-28 NOTE — Assessment & Plan Note (Signed)
-  BP well-controlled today -no longer taking BP meds

## 2020-05-28 NOTE — Assessment & Plan Note (Signed)
-  get records from Dr. Juel Burrow office

## 2020-05-30 DIAGNOSIS — R32 Unspecified urinary incontinence: Secondary | ICD-10-CM | POA: Diagnosis not present

## 2020-06-09 DIAGNOSIS — G4733 Obstructive sleep apnea (adult) (pediatric): Secondary | ICD-10-CM | POA: Diagnosis not present

## 2020-06-12 ENCOUNTER — Other Ambulatory Visit: Payer: Self-pay | Admitting: Nurse Practitioner

## 2020-06-12 DIAGNOSIS — F4310 Post-traumatic stress disorder, unspecified: Secondary | ICD-10-CM

## 2020-06-12 DIAGNOSIS — F3341 Major depressive disorder, recurrent, in partial remission: Secondary | ICD-10-CM

## 2020-06-12 NOTE — Telephone Encounter (Signed)
Do you want to fill this for pt? You have not sent this in for her before.

## 2020-06-12 NOTE — Telephone Encounter (Signed)
I sent it in for her!

## 2020-06-30 DIAGNOSIS — R32 Unspecified urinary incontinence: Secondary | ICD-10-CM | POA: Diagnosis not present

## 2020-07-01 ENCOUNTER — Telehealth: Payer: Self-pay

## 2020-07-01 NOTE — Telephone Encounter (Signed)
error 

## 2020-07-07 ENCOUNTER — Other Ambulatory Visit: Payer: Self-pay | Admitting: Nurse Practitioner

## 2020-07-09 ENCOUNTER — Telehealth: Payer: Self-pay

## 2020-07-09 NOTE — Telephone Encounter (Signed)
Patient came in needs med refill. Patient says no longer gets this meds from Dr Nevada Crane.patient cb# [phone].  QUEtiapine (SEROQUEL) 100 MG tablet   Pharmacy: [facility]

## 2020-07-10 ENCOUNTER — Other Ambulatory Visit: Payer: Self-pay | Admitting: Nurse Practitioner

## 2020-07-10 DIAGNOSIS — G4700 Insomnia, unspecified: Secondary | ICD-10-CM

## 2020-07-10 MED ORDER — TRAZODONE HCL 100 MG PO TABS: 100.0000 mg | ORAL_TABLET | Freq: Every day | ORAL | 1 refills | Status: DC

## 2020-07-16 ENCOUNTER — Ambulatory Visit: Payer: Medicaid Other | Admitting: Nurse Practitioner

## 2020-07-17 ENCOUNTER — Other Ambulatory Visit: Payer: Self-pay | Admitting: Nurse Practitioner

## 2020-07-18 ENCOUNTER — Other Ambulatory Visit: Payer: Self-pay

## 2020-07-18 DIAGNOSIS — B20 Human immunodeficiency virus [HIV] disease: Secondary | ICD-10-CM

## 2020-07-18 MED ORDER — ACYCLOVIR 800 MG PO TABS: ORAL_TABLET | ORAL | 0 refills | Status: AC

## 2020-07-18 MED ORDER — GENVOYA 150-150-200-10 MG PO TABS: 1.0000 | ORAL_TABLET | Freq: Every day | ORAL | 0 refills | Status: DC

## 2020-07-23 ENCOUNTER — Other Ambulatory Visit: Payer: Self-pay

## 2020-07-23 ENCOUNTER — Encounter: Payer: Self-pay | Admitting: Nurse Practitioner

## 2020-07-23 ENCOUNTER — Ambulatory Visit: Payer: Medicaid Other | Admitting: Nurse Practitioner

## 2020-07-23 DIAGNOSIS — R21 Rash and other nonspecific skin eruption: Secondary | ICD-10-CM

## 2020-07-23 DIAGNOSIS — I10 Essential (primary) hypertension: Secondary | ICD-10-CM

## 2020-07-23 DIAGNOSIS — F419 Anxiety disorder, unspecified: Secondary | ICD-10-CM | POA: Diagnosis not present

## 2020-07-23 DIAGNOSIS — R7401 Elevation of levels of liver transaminase levels: Secondary | ICD-10-CM | POA: Diagnosis not present

## 2020-07-23 NOTE — Assessment & Plan Note (Signed)
-  she states that she has been having more frequent anxiety attacks -she was requesting xanax -referral to psych

## 2020-07-23 NOTE — Assessment & Plan Note (Signed)
-  BP well controlled today; no adverse med effects

## 2020-07-23 NOTE — Assessment & Plan Note (Signed)
-  facial rash not improved with triamcinolone cream -referral to derm

## 2020-07-23 NOTE — Patient Instructions (Signed)
Please have fasting labs drawn within the week.

## 2020-07-23 NOTE — Assessment & Plan Note (Signed)
-  recheck LFTs
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
## 2020-07-30 DIAGNOSIS — R32 Unspecified urinary incontinence: Secondary | ICD-10-CM | POA: Diagnosis not present

## 2020-08-06 ENCOUNTER — Telehealth (INDEPENDENT_AMBULATORY_CARE_PROVIDER_SITE_OTHER): Payer: Medicaid Other | Admitting: Licensed Clinical Social Worker

## 2020-08-06 ENCOUNTER — Other Ambulatory Visit: Payer: Self-pay

## 2020-08-06 DIAGNOSIS — F331 Major depressive disorder, recurrent, moderate: Secondary | ICD-10-CM

## 2020-08-06 DIAGNOSIS — F419 Anxiety disorder, unspecified: Secondary | ICD-10-CM

## 2020-08-12 ENCOUNTER — Telehealth: Payer: Self-pay | Admitting: Licensed Clinical Social Worker

## 2020-08-12 DIAGNOSIS — F419 Anxiety disorder, unspecified: Secondary | ICD-10-CM

## 2020-08-12 NOTE — Progress Notes (Signed)
Chelsey Henson Initial Clinical Assessment  MRN: [id number] NAME: Chelsey Henson Date: 08/12/20  Start time: 1140a End time: 1150a Total time: 10  Type of Contact: telephone (Initial Telephone Assessment or Urgent Video Visit) Patient consent obtained: Yes  Referring Provider: Demetrius Revel, NP Reason for Visit today: initiate [facility] services  Treatment History Patient recently received Inpatient Treatment: No    Patient currently being seen by therapist/psychiatrist: No  Patient currently receiving the following services: n/a  Past Psychiatric History/Diagnosis/Hospitalization(s): Anxiety: No Bipolar Disorder: No Depression: No Mania: No Psychosis: No Schizophrenia: No Personality Disorder: No Hospitalization for psychiatric illness: No History of Electroconvulsive Shock Therapy: No Prior Suicide Attempts: No  Decreased need for sleep: Yes  Euphoria: No  Self Injurious behaviors: No  Family History of mental illness: No  Family History of substance abuse: No  Substance Abuse: No  DUI: No  Insomnia: No   History of violence: No  Physical, sexual or emotional abuse: No  Prior outpatient mental health therapy: No   Clinical Assessment  PHQ-9 & GAD-7 Assessments: This is an evidence based assessment tool for depression and anxiety symptoms in adolescents and adults.  Score cut-off points for each section are as follows: 5-9: Mild, 10-14: Moderate, 15+: Severe  PHQ-9 for Depression = 7   GAD-7 for Anxiety = 14   How difficult have these problems made it for you to do your work, take care of things at home, or get along with other people? difficult   Social Functioning Social maturity: WNL Social judgement: WNL  Stress Current stressors: "I have so many, I don't know anymore." Familial stressors: n/a Sleep: poor Appetite: poor Coping ability: overwhelmed Patient taking medications as prescribed:    Current medications:  Outpatient Encounter Medications  as of 08/06/2020  Medication Sig   acyclovir (ZOVIRAX) 800 MG tablet TAKE (1) TABLET BY MOUTH ONCE DAILY.   albuterol (PROVENTIL HFA;VENTOLIN HFA) 108 (90 Base) MCG/ACT inhaler Inhale 2 puffs into the lungs every 4 (four) hours as needed for wheezing or shortness of breath.   atorvastatin (LIPITOR) 40 MG tablet Take 40 mg by mouth daily.   elvitegravir-cobicistat-emtricitabine-tenofovir (GENVOYA) 150-150-200-10 MG TABS tablet Take 1 tablet by mouth daily.   escitalopram (LEXAPRO) 20 MG tablet TAKE (1) TABLET BY MOUTH ONCE DAILY.   estradiol (ESTRACE) 1 MG tablet Take 1 mg by mouth every evening.    famotidine (PEPCID) 40 MG tablet TAKE (1) TABLET BY MOUTH TWICE DAILY.   Melatonin 1 MG CAPS Take 2 mg by mouth at bedtime.   oxybutynin (DITROPAN-XL) 10 MG 24 hr tablet TAKE (1) TABLET BY MOUTH AT BEDTIME. (Patient taking differently: Take 10 mg by mouth at bedtime.)   QUEtiapine (SEROQUEL) 100 MG tablet Take 1 tablet (100 mg total) by mouth at bedtime.   traZODone (DESYREL) 100 MG tablet Take 1 tablet (100 mg total) by mouth at bedtime.   No facility-administered encounter medications on file as of 08/06/2020.     Self-harm and/or Suicidal Behaviors Risk Assessment Self-harm risk factors: no Patient endorses recent self injurious thoughts and/or behaviors: No  Suicide ideations: No plan to harm self or others   Danger to Others Risk Assessment Danger to others risk factors: no Patient endorses recent thoughts of harming others: No    Substance Use Assessment Patient recently consumed alcohol: No  Patient recently used drugs: No  Patient is concerned about dependence or abuse of substances: No    Goals, Interventions and Follow-up Plan Goals: Increase healthy adjustment  to current life circumstances Interventions: Motivational Interviewing and CBT Cognitive Behavioral Therapy Follow-up Plan:  weekly VBH services  Summary of Clinical Assessment Summary: Chelsey Henson is a 50 year old woman  referred by her PCP, Demetrius Revel, NP for assistance with her anxiety.  She reports that her panic attacks have increased and she is requesting medication to reduce symptoms.  She has a history of Xanax and reports that she was able to complete ADLs while on the medication. She is having anxiousness most of the day, is worried about random things such as the weather, finances, etc and is irritable. She is currently on lexapro since April 2022.   Chelsey Henson was late for her session and will follow up with her next week.   Lubertha South, LCSW

## 2020-08-25 ENCOUNTER — Ambulatory Visit: Payer: Medicaid Other | Admitting: Nurse Practitioner

## 2020-08-27 ENCOUNTER — Ambulatory Visit: Payer: Medicaid Other | Admitting: Nurse Practitioner

## 2020-08-28 NOTE — BH Specialist Note (Signed)
error 

## 2020-08-30 DIAGNOSIS — R32 Unspecified urinary incontinence: Secondary | ICD-10-CM | POA: Diagnosis not present

## 2020-09-01 ENCOUNTER — Other Ambulatory Visit: Payer: Self-pay

## 2020-09-01 ENCOUNTER — Ambulatory Visit
Admission: RE | Admit: 2020-09-01 | Discharge: 2020-09-01 | Disposition: A | Discharge status: Home / Self Care | Payer: Medicaid Other | Source: Ambulatory Visit | Attending: Family Medicine | Admitting: Family Medicine

## 2020-09-01 VITALS — BP 123/88 | HR 85 | Temp 98.1°F | Resp 20

## 2020-09-01 DIAGNOSIS — M5440 Lumbago with sciatica, unspecified side: Secondary | ICD-10-CM | POA: Diagnosis not present

## 2020-09-01 DIAGNOSIS — L304 Erythema intertrigo: Secondary | ICD-10-CM

## 2020-09-01 MED ORDER — DEXAMETHASONE SODIUM PHOSPHATE 10 MG/ML IJ SOLN
10.0000 mg | Freq: Once | INTRAMUSCULAR | Status: AC
  Administered 2020-09-01: 10 mg via INTRAMUSCULAR

## 2020-09-01 MED ORDER — TIZANIDINE HCL 2 MG PO TABS: 2.0000 mg | ORAL_TABLET | Freq: Two times a day (BID) | ORAL | 0 refills | Status: DC | PRN

## 2020-09-01 MED ORDER — TRIAMCINOLONE ACETONIDE 0.1 % EX CREA
1.0000 | TOPICAL_CREAM | Freq: Two times a day (BID) | CUTANEOUS | 0 refills | Status: DC | PRN
Start: 2020-09-01 — End: 2022-05-07

## 2020-09-01 MED ORDER — KETOROLAC TROMETHAMINE 30 MG/ML IJ SOLN
30.0000 mg | Freq: Once | INTRAMUSCULAR | Status: AC
  Administered 2020-09-01: 30 mg via INTRAMUSCULAR

## 2020-09-01 MED ORDER — NYSTATIN 100000 UNIT/GM EX POWD
1.0000 | Freq: Two times a day (BID) | CUTANEOUS | 0 refills | Status: DC | PRN
Start: 2020-09-01 — End: 2021-07-28

## 2020-09-01 MED ORDER — NAPROXEN 500 MG PO TABS
500.0000 mg | ORAL_TABLET | Freq: Two times a day (BID) | ORAL | 0 refills | Status: DC
Start: 2020-09-01 — End: 2022-05-06

## 2020-09-01 NOTE — Discharge Instructions (Addendum)
For back pain I have prescribed you naproxen to take twice daily as needed along with tizanidine.  Tizanidine can result in severe drowsiness and decreased alertness therefore do not operate motor vehicle or machinery while taking medication. For the rash and back pain you received a steroid shot here in clinic. You also received Toradol which will help with back pain. For treatment of the rash refer to as intertrigo I have sent over triamcinolone cream apply twice daily for the next 7 to 10 days.  After rash is resolved apply nystatin powder this is to prevent recurrence of the rash.

## 2020-09-01 NOTE — ED Triage Notes (Signed)
Pt presents with low back pain for past 3 days , denies injury but lifts a lot at work, also has rash on neck  that is painful

## 2020-09-01 NOTE — ED Provider Notes (Signed)
[facility]    CSN: [id number] Arrival date & time: 09/01/20  1139      History   Chief Complaint Chief Complaint  Patient presents with   Back Pain    HPI Chelsey Henson is a 50 y.o. female.   HPI Patient presents today with mid bilateral low back pain.  Patient denies any known injury.  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 She has a history of sciatica affecting the left lower extremity. She has been taking NSAIDs without relief of symptoms. Rash present x few days. Reports wearing jewelry the other day and subsequently develop a rash encircling her neck which is itching and red. No drainage. No rash involving other areas of the body.   Past Medical History:  Diagnosis Date   Anemia    Anxiety    Asthma    COPD (chronic obstructive pulmonary disease) ([place]) 04/05/2016   Phreesia 05/28/2020   Depression    Depression    Phreesia 05/28/2020   Genital herpes    GERD (gastroesophageal reflux disease)    HIV (human immunodeficiency virus infection) ([place]) 11/24/2017   HIV infection ([place]) 2008   Hypertension    Sleep apnea    Substance abuse ([place])    cocaine- last use was Oct 2009   Vaginal Pap smear, abnormal     Patient Active Problem List   Diagnosis Date Noted   Rash 07/23/2020   Post traumatic stress disorder (PTSD) 04/30/2019   Anxiety 04/30/2019   Encounter to establish care 10/27/2018   Transaminitis 05/24/2017   Gastroesophageal reflux disease without esophagitis 04/06/2017   MDD (major depressive disorder), recurrent episode, moderate ([place]) 09/08/2016   Cocaine use disorder, moderate, in sustained remission ([place]) 09/08/2016   OSA (obstructive sleep apnea) 04/15/2016   Asthma 04/15/2016   HIV (human immunodeficiency virus infection) ([place]) 04/05/2016   Essential hypertension 04/05/2016    Past Surgical History:  Procedure Laterality Date    ABDOMINAL HYSTERECTOMY     BENIGN MASS; total hysterectomy   CESAREAN SECTION     ESOPHAGOGASTRODUODENOSCOPY N/A 06/30/2017   Procedure: ESOPHAGOGASTRODUODENOSCOPY (EGD);  Surgeon: Rogene Houston, MD;  [facility];  Service: Endoscopy;  Laterality: N/A;  2:00   HERNIA REPAIR  2017    OB History     Gravida  6   Para  4   Term  4   Preterm      AB  2   Living  4      SAB  2   IAB      Ectopic      Multiple      Live Births               Home Medications    Prior to Admission medications   Medication Sig Start Date End Date Taking? Authorizing Provider  naproxen (NAPROSYN) 500 MG tablet Take 1 tablet (500 mg total) by mouth 2 (two) times daily. 09/01/20  Yes Scot Jun, FNP  nystatin (MYCOSTATIN/NYSTOP) powder Apply 1 application topically 2 (two) times daily as needed. 09/01/20  Yes Scot Jun, FNP  tiZANidine (ZANAFLEX) 2 MG tablet Take 1-2 tablets (2-4 mg total) by mouth 2 (two) times daily as needed for muscle spasms. 09/01/20  Yes Scot Jun, FNP  acyclovir (ZOVIRAX) 800 MG tablet TAKE (1) TABLET BY MOUTH ONCE DAILY. 07/18/20   Demetrius Revel  M, NP  albuterol (PROVENTIL HFA;VENTOLIN HFA) 108 (90 Base) MCG/ACT inhaler Inhale 2 puffs into the lungs every 4 (four) hours as needed for wheezing or shortness of breath. 07/09/17   Noemi Chapel, MD  atorvastatin (LIPITOR) 40 MG tablet Take 40 mg by mouth daily.    [provider]  elvitegravir-cobicistat-emtricitabine-tenofovir (GENVOYA) 150-150-200-10 MG TABS tablet Take 1 tablet by mouth daily. 07/18/20   Noreene Larsson, NP  escitalopram (LEXAPRO) 20 MG tablet TAKE (1) TABLET BY MOUTH ONCE DAILY. 06/12/20   Noreene Larsson, NP  estradiol (ESTRACE) 1 MG tablet Take 1 mg by mouth every evening.     [provider]  famotidine (PEPCID) 40 MG tablet TAKE (1) TABLET BY MOUTH TWICE DAILY. 02/13/20   Harvel Quale, MD  Melatonin 1 MG CAPS Take 2 mg by mouth at bedtime.     [provider]  oxybutynin (DITROPAN-XL) 10 MG 24 hr tablet TAKE (1) TABLET BY MOUTH AT BEDTIME. Patient taking differently: Take 10 mg by mouth at bedtime. 06/20/17   Raylene Everts, MD  QUEtiapine (SEROQUEL) 100 MG tablet Take 1 tablet (100 mg total) by mouth at bedtime. 06/06/19   [place] Crane, MD  traZODone (DESYREL) 100 MG tablet Take 1 tablet (100 mg total) by mouth at bedtime. 07/10/20   Noreene Larsson, NP  budesonide-formoterol [facility]) 160-4.5 MCG/ACT inhaler Inhale 2 puffs into the lungs 2 (two) times daily.  09/25/18  [provider]  montelukast (SINGULAIR) 10 MG tablet Take 1 tablet (10 mg total) by mouth at bedtime. 07/26/17 09/25/18  Comer, Okey Regal, MD    Family History Family History  Problem Relation Age of Onset   Depression Mother    Hyperlipidemia Mother    Learning disabilities Mother    Alcohol abuse Mother 74   Alcohol abuse Father    Arthritis Father    Cancer Father    Heart disease Father    Hypertension Father    Learning disabilities Father    Alcohol abuse Sister        RECOVERED   Alcohol abuse Brother    Alcohol abuse Brother    Alcohol abuse Brother     Social History Social History   Tobacco Use   Smoking status: Never   Smokeless tobacco: Never  Vaping Use   Vaping Use: Never used  Substance Use Topics   Alcohol use: Yes    Alcohol/week: 1.0 standard drink    Types: 1 Cans of beer per week    Comment: occ.   Drug use: No    Comment: none in 7 years     Allergies   Abilify [aripiprazole] and Codeine   Review of Systems Review of Systems Pertinent negatives listed in HPI   Physical Exam Triage Vital Signs ED Triage Vitals  Enc Vitals Group     BP 09/01/20 1218 123/88     Pulse Rate 09/01/20 1218 85     Resp 09/01/20 1218 20     Temp 09/01/20 1218 98.1 F (36.7 C)     Temp src --      SpO2 09/01/20 1218 96 %     Weight --      Height --      Head Circumference --      Peak Flow --      Pain  Score 09/01/20 1215 6     Pain Loc --      Pain Edu? --      Excl.  in [place]? --    No data found.  Updated Vital Signs BP 123/88   Pulse 85   Temp 98.1 F (36.7 C)   Resp 20   LMP 09/30/2014 (Approximate)   SpO2 96%   Visual Acuity Right Eye Distance:   Left Eye Distance:   Bilateral Distance:    Right Eye Near:   Left Eye Near:    Bilateral Near:     Physical Exam HENT:     Head: Normocephalic and atraumatic.  Cardiovascular:     Rate and Rhythm: Normal rate and regular rhythm.  Pulmonary:     Effort: Pulmonary effort is normal.     Breath sounds: Normal breath sounds.  Musculoskeletal:       Arms:  Skin:    Capillary Refill: Capillary refill takes less than 2 seconds.     Findings: Erythema and rash present.  Neurological:     General: No focal deficit present.  Psychiatric:        Mood and Affect: Mood normal.        Behavior: Behavior normal.        Thought Content: Thought content normal.        Judgment: Judgment normal.     UC Treatments / Results  Labs (all labs ordered are listed, but only abnormal results are displayed) Labs Reviewed - No data to display  EKG   Radiology No results found.  Procedures Procedures (including critical care time)  Medications Ordered in UC Medications  ketorolac (TORADOL) 30 MG/ML injection 30 mg (has no administration in time range)  dexamethasone (DECADRON) injection 10 mg (has no administration in time range)    Initial Impression / Assessment and Plan / UC Course  I have reviewed the triage vital signs and the nursing notes.  Pertinent labs & imaging results that were available during my care of the patient were reviewed by me and considered in my medical decision making (see chart for details).    Acute bilateral low back pain with sciatica treatment today with dexamethasone 10 mg and Toradol 30 mg IM given here in clinic.  Advised to discontinue ibuprofen and start naproxen along with tizanidine as  needed as needed for pain.  For rash treatment with dexamethasone here in clinic today will resolve the inflammation associated with rash for preventative treatment recommend use of nystatin powder to prevent moisture which can result in intertrigo.  Return precautions given.  Final Clinical Impressions(s) / UC Diagnoses   Final diagnoses:  Acute bilateral low back pain with sciatica, sciatica laterality unspecified  Erythema intertrigo     Discharge Instructions      For back pain I have prescribed you naproxen to take twice daily as needed along with tizanidine.  Tizanidine can result in severe drowsiness and decreased alertness therefore do not operate motor vehicle or machinery while taking medication. For the rash and back pain you received a steroid shot here in clinic. You also received Toradol which will help with back pain. For treatment of the rash refer to as intertrigo I have sent over triamcinolone cream apply twice daily for the next 7 to 10 days.  After rash is resolved apply nystatin powder this is to prevent recurrence of the rash.   ED Prescriptions     Medication Sig Dispense Auth. Provider   naproxen (NAPROSYN) 500 MG tablet Take 1 tablet (500 mg total) by mouth 2 (two) times daily. 20 tablet Scot Jun, FNP   tiZANidine (ZANAFLEX) 2  MG tablet Take 1-2 tablets (2-4 mg total) by mouth 2 (two) times daily as needed for muscle spasms. 12 tablet Scot Jun, FNP   nystatin (MYCOSTATIN/NYSTOP) powder Apply 1 application topically 2 (two) times daily as needed. 60 g Scot Jun, FNP      PDMP not reviewed this encounter.   Scot Jun, FNP 09/06/20 1122

## 2020-09-18 DIAGNOSIS — G4733 Obstructive sleep apnea (adult) (pediatric): Secondary | ICD-10-CM | POA: Diagnosis not present

## 2020-09-25 ENCOUNTER — Other Ambulatory Visit: Payer: Self-pay | Admitting: Nurse Practitioner

## 2020-09-25 DIAGNOSIS — B20 Human immunodeficiency virus [HIV] disease: Secondary | ICD-10-CM

## 2020-09-30 DIAGNOSIS — R32 Unspecified urinary incontinence: Secondary | ICD-10-CM | POA: Diagnosis not present

## 2020-10-20 ENCOUNTER — Other Ambulatory Visit (HOSPITAL_COMMUNITY): Payer: Self-pay | Admitting: Family Medicine

## 2020-10-20 DIAGNOSIS — M545 Low back pain, unspecified: Secondary | ICD-10-CM

## 2020-10-21 ENCOUNTER — Other Ambulatory Visit: Payer: Self-pay

## 2020-10-21 ENCOUNTER — Ambulatory Visit (HOSPITAL_COMMUNITY)
Admission: RE | Admit: 2020-10-21 | Discharge: 2020-10-21 | Disposition: A | Discharge status: Home / Self Care | Payer: Medicaid Other | Source: Ambulatory Visit | Attending: Family Medicine | Admitting: Family Medicine

## 2020-10-21 DIAGNOSIS — M545 Low back pain, unspecified: Secondary | ICD-10-CM | POA: Diagnosis not present

## 2020-10-22 ENCOUNTER — Other Ambulatory Visit: Payer: Self-pay | Admitting: Nurse Practitioner

## 2020-10-22 DIAGNOSIS — B20 Human immunodeficiency virus [HIV] disease: Secondary | ICD-10-CM

## 2020-10-31 DIAGNOSIS — R32 Unspecified urinary incontinence: Secondary | ICD-10-CM | POA: Diagnosis not present

## 2020-11-18 ENCOUNTER — Ambulatory Visit: Payer: Medicaid Other | Admitting: Orthopaedic Surgery

## 2020-11-18 ENCOUNTER — Encounter: Payer: Self-pay | Admitting: Orthopaedic Surgery

## 2020-11-18 ENCOUNTER — Other Ambulatory Visit: Payer: Self-pay

## 2020-11-18 VITALS — BP 147/91 | HR 62 | Ht 64.0 in | Wt 240.6 lb

## 2020-11-18 DIAGNOSIS — G8929 Other chronic pain: Secondary | ICD-10-CM

## 2020-11-18 DIAGNOSIS — M25561 Pain in right knee: Secondary | ICD-10-CM | POA: Diagnosis not present

## 2020-11-18 NOTE — Progress Notes (Signed)
PROCEDURE NOTE:  The patient requests injections of the right knee , verbal consent was obtained.  The right knee was prepped appropriately after time out was performed.   Sterile technique was observed and injection of 1 cc of [place] 20m with several cc's of plain xylocaine. Anesthesia was provided by ethyl chloride and a 20-gauge needle was used to inject the knee area. The injection was tolerated well.  A band aid dressing was applied.  The patient was advised to apply ice later today and tomorrow to the injection sight as needed.   See prn.  Note for limited hours at work given.  Call if any problem.  Precautions discussed.  Electronically Signed WSanjuana Kava MD 9/20/20222:54 PM

## 2020-11-18 NOTE — Patient Instructions (Signed)
Work note for patient to work 4 days a week 8 hours a day and limited standing.

## 2020-11-20 ENCOUNTER — Other Ambulatory Visit: Payer: Self-pay | Admitting: Nurse Practitioner

## 2020-11-20 DIAGNOSIS — G4700 Insomnia, unspecified: Secondary | ICD-10-CM

## 2020-11-20 DIAGNOSIS — B20 Human immunodeficiency virus [HIV] disease: Secondary | ICD-10-CM

## 2020-11-24 ENCOUNTER — Ambulatory Visit
Admission: EM | Admit: 2020-11-24 | Discharge: 2020-11-24 | Disposition: A | Discharge status: Home / Self Care | Payer: Medicaid Other | Attending: Family Medicine | Admitting: Family Medicine

## 2020-11-24 ENCOUNTER — Other Ambulatory Visit: Payer: Self-pay

## 2020-11-24 ENCOUNTER — Encounter: Payer: Self-pay | Admitting: Emergency Medicine

## 2020-11-24 ENCOUNTER — Telehealth: Payer: Self-pay | Admitting: Orthopedic Surgery

## 2020-11-24 DIAGNOSIS — R609 Edema, unspecified: Secondary | ICD-10-CM

## 2020-11-24 DIAGNOSIS — M545 Low back pain, unspecified: Secondary | ICD-10-CM

## 2020-11-24 MED ORDER — KETOROLAC TROMETHAMINE 60 MG/2ML IM SOLN
60.0000 mg | Freq: Once | INTRAMUSCULAR | Status: AC
  Administered 2020-11-24: 60 mg via INTRAMUSCULAR

## 2020-11-24 MED ORDER — FUROSEMIDE 20 MG PO TABS: 20.0000 mg | ORAL_TABLET | Freq: Every day | ORAL | 0 refills | Status: DC

## 2020-11-24 NOTE — Discharge Instructions (Signed)
HOME CARE INSTRUCTIONS: For many people, back pain returns. Since low back pain is rarely dangerous, it is often a condition that people can learn to manage on their own. Please remain active. It is stressful on the back to sit or stand in one place. Do not sit, drive, or stand in one place for more than 30 minutes at a time. Take short walks on level surfaces as soon as pain allows. Try to increase the length of time you walk each day. Do not stay in bed. Resting more than 1 or 2 days can delay your recovery. Do not avoid exercise or work. Your body is made to move. It is not dangerous to be active, even though your back may hurt. Your back will likely heal faster if you return to being active before your pain is gone. Over-the-counter medicines to reduce pain and inflammation are often the most helpful.  SEEK MEDICAL CARE IF: You have pain that is not relieved with rest or medicine. You have pain that does not improve in 1 week. You have new symptoms. You are generally not feeling well.  SEEK IMMEDIATE MEDICAL CARE IF: You have pain that radiates from your back into your legs. You develop new bowel or bladder control problems. You have unusual weakness or numbness in your arms or legs. You develop nausea or vomiting. You develop abdominal pain. You feel faint.  Swelling happens when fluid collects in small spaces around tissues and organs inside the body. Another word for swelling is "edema." Some common parts of the body where people can have swelling are the lower legs or hands. This typically is worse in the areas of the body that are closest to the ground (because of gravity)  Symptoms of swelling can include puffiness of the skin, which can cause the skin to look stretched and shiny. This often occurs with swelling in the lower legs and can be worse after you sit or stand for a long time.  Treatment of edema includes several components: treatment of the underlying cause (if possible),  reducing the amount of salt (sodium) in your diet, and, in many cases, use of a medication called a diuretic to eliminate excess fluid. Using compression stockings and elevating the legs may also be recommended.

## 2020-11-24 NOTE — ED Triage Notes (Signed)
Pt here for lower back pain; pt sts stands up a lot at work and some bilateral ankle swelling

## 2020-11-24 NOTE — Telephone Encounter (Signed)
Patient asking about physical therapy after going to [facility] urgent care in [place] today, 11/24/20, after recent visit with Dr Luna Glasgow - said was given a steroid shot there. Please advise if therapy may be an option.

## 2020-11-26 NOTE — ED Provider Notes (Signed)
[place]   [id number] 11/24/20 Arrival Time: 1132  ASSESSMENT & PLAN:  1. Acute bilateral low back pain without sciatica   2. Peripheral edema     Able to ambulate here and hemodynamically stable. No indication for imaging of back at this time given no trauma and normal neurological exam. Discussed.  Meds ordered this encounter  Medications   ketorolac (TORADOL) injection 60 mg   furosemide (LASIX) 20 MG tablet    Sig: Take 1 tablet (20 mg total) by mouth daily for 5 days.    Dispense:  5 tablet    Refill:  0   Encourage ROM/movement as tolerated.    Discharge Instructions      HOME CARE INSTRUCTIONS: For many people, back pain returns. Since low back pain is rarely dangerous, it is often a condition that people can learn to manage on their own. Please remain active. It is stressful on the back to sit or stand in one place. Do not sit, drive, or stand in one place for more than 30 minutes at a time. Take short walks on level surfaces as soon as pain allows. Try to increase the length of time you walk each day. Do not stay in bed. Resting more than 1 or 2 days can delay your recovery. Do not avoid exercise or work. Your body is made to move. It is not dangerous to be active, even though your back may hurt. Your back will likely heal faster if you return to being active before your pain is gone. Over-the-counter medicines to reduce pain and inflammation are often the most helpful.  SEEK MEDICAL CARE IF: You have pain that is not relieved with rest or medicine. You have pain that does not improve in 1 week. You have new symptoms. You are generally not feeling well.  SEEK IMMEDIATE MEDICAL CARE IF: You have pain that radiates from your back into your legs. You develop new bowel or bladder control problems. You have unusual weakness or numbness in your arms or legs. You develop nausea or vomiting. You develop abdominal pain. You feel faint.  Swelling happens when  fluid collects in small spaces around tissues and organs inside the body. Another word for swelling is "edema." Some common parts of the body where people can have swelling are the lower legs or hands. This typically is worse in the areas of the body that are closest to the ground (because of gravity)  Symptoms of swelling can include puffiness of the skin, which can cause the skin to look stretched and shiny. This often occurs with swelling in the lower legs and can be worse after you sit or stand for a long time.  Treatment of edema includes several components: treatment of the underlying cause (if possible), reducing the amount of salt (sodium) in your diet, and, in many cases, use of a medication called a diuretic to eliminate excess fluid. Using compression stockings and elevating the legs may also be recommended.        Reviewed expectations re: course of current medical issues. Questions answered. Outlined signs and symptoms indicating need for more acute intervention. Patient verbalized understanding. After Visit Summary given.   SUBJECTIVE: History from: patient.  Chelsey Henson is a 50 y.o. female who presents with complaint of intermittent low back discomfort and LE edema; h/o same symptoms in the past; sev days. Ankle swelling worse in evenings; better in mornings; is painful sometimes. No assoc SOB/CP. No recent or prolonged immobilization. No new  medications. No injury/trauma. Normal bowel/bladder habits.  OBJECTIVE:  Vitals:   11/24/20 1209  BP: (!) 159/91  Pulse: 78  Resp: 16  Temp: 98 F (36.7 C)  TempSrc: Oral  SpO2: 96%    General appearance: alert; no distress HEENT: [place]; AT Neck: supple with FROM; without midline tenderness CV: regular Lungs: unlabored respirations; speaks full sentences without difficulty Abdomen: soft, non-tender; non-distended Back: no specific tenderness to palpation; FROM at waist; bruising: none; without midline  tenderness Extremities: with bilateral 1+ ankle edema; normal ROM of bilateral LE Skin: warm and dry Neurologic: normal gait; normal sensation and strength of bilateral LE Psychological: alert and cooperative; normal mood and affect   Allergies  Allergen Reactions   Abilify [Aripiprazole] Other (See Comments)    Tardive dyskinesia   Codeine Itching    Past Medical History:  Diagnosis Date   Anemia    Anxiety    Asthma    COPD (chronic obstructive pulmonary disease) ([place]) 04/05/2016   Phreesia 05/28/2020   Depression    Depression    Phreesia 05/28/2020   Genital herpes    GERD (gastroesophageal reflux disease)    HIV (human immunodeficiency virus infection) ([place]) 11/24/2017   HIV infection ([place]) 2008   Hypertension    Sleep apnea    Substance abuse ([place])    cocaine- last use was Oct 2009   Vaginal Pap smear, abnormal    Social History   Socioeconomic History   Marital status: Legally Separated    Spouse name: Legrand Como   Number of children: 4   Years of education: 8   Highest education level: Not on file  Occupational History   Occupation: [employer]    Comment: delivery  Tobacco Use   Smoking status: Never   Smokeless tobacco: Never  Vaping Use   Vaping Use: Never used  Substance and Sexual Activity   Alcohol use: Yes    Alcohol/week: 1.0 standard drink    Types: 1 Cans of beer per week    Comment: occ.   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 [employer]   Social Determinants of Radio broadcast assistant Strain: Not on file  Food Insecurity: Not on file  Transportation Needs: Not on file  Physical Activity: Not on file  Stress: Not on file   Social Connections: Not on file  Intimate Partner Violence: Not on file   Family History  Problem Relation Age of Onset   Depression Mother    Hyperlipidemia Mother    Learning disabilities Mother    Alcohol abuse Mother 55   Alcohol abuse Father    Arthritis Father    Cancer Father    Heart disease Father    Hypertension Father    Learning disabilities Father    Alcohol abuse Sister        RECOVERED   Alcohol abuse Brother    Alcohol abuse Brother    Alcohol abuse Brother    Past Surgical History:  Procedure Laterality Date   ABDOMINAL HYSTERECTOMY     BENIGN MASS; total hysterectomy   CESAREAN SECTION     ESOPHAGOGASTRODUODENOSCOPY  N/A 06/30/2017   Procedure: ESOPHAGOGASTRODUODENOSCOPY (EGD);  Surgeon: Rogene Houston, MD;  [facility];  Service: Endoscopy;  Laterality: N/A;  2:00   HERNIA REPAIR  2017      Vanessa Kick, MD 11/26/20 [phone]

## 2020-12-02 ENCOUNTER — Ambulatory Visit: Payer: Medicaid Other | Admitting: Orthopaedic Surgery

## 2020-12-04 ENCOUNTER — Encounter: Payer: Self-pay | Admitting: Orthopaedic Surgery

## 2020-12-04 ENCOUNTER — Ambulatory Visit (INDEPENDENT_AMBULATORY_CARE_PROVIDER_SITE_OTHER): Payer: Medicaid Other | Admitting: Orthopaedic Surgery

## 2020-12-04 ENCOUNTER — Other Ambulatory Visit: Payer: Self-pay

## 2020-12-04 VITALS — BP 145/80 | HR 82 | Ht 64.0 in | Wt 243.0 lb

## 2020-12-04 DIAGNOSIS — M545 Low back pain, unspecified: Secondary | ICD-10-CM | POA: Diagnosis not present

## 2020-12-04 DIAGNOSIS — Z6841 Body Mass Index (BMI) 40.0 and over, adult: Secondary | ICD-10-CM | POA: Diagnosis not present

## 2020-12-04 NOTE — Progress Notes (Signed)
My back is hurting.  She has been to Urgent Care twice for her lower back pain.  Most recent was 11/24/20.  I have reviewed the notes. She has had X-rays of the lumbar spine.  I have independently reviewed and interpreted x-rays of this patient done at another site by another physician or qualified health professional..  She has mid lower back pain with no radiation.  She has pain walking and standing but not laying down.  She has no trauma, no weakness, no redness.  She has been on Zanaflex and Robaxin with little help.  She wants to go to PT.  I will change to Flexeril and arrange PT.  Spine/Pelvis examination:  Inspection:  Overall, sacoiliac joint benign and hips nontender; without crepitus or defects.   Thoracic spine inspection: Alignment normal without kyphosis present   Lumbar spine inspection:  Alignment  with normal lumbar lordosis, without scoliosis apparent.   Thoracic spine palpation:  without tenderness of spinal processes   Lumbar spine palpation: without tenderness of lumbar area; without tightness of lumbar muscles    Range of Motion:   Lumbar flexion, forward flexion is normal without pain or tenderness    Lumbar extension is full without pain or tenderness   Left lateral bend is normal without pain or tenderness   Right lateral bend is normal without pain or tenderness   Straight leg raising is normal  Strength & tone: normal   Stability overall normal stability  Encounter Diagnoses  Name Primary?   Lumbar pain Yes   Body mass index 40.0-44.9, adult (HCC)    Morbid obesity ([place])    Return in one month.  Call if any problem.  Precautions discussed.  Electronically Signed Sanjuana Kava, MD 10/6/202211:32 AM

## 2020-12-08 ENCOUNTER — Telehealth: Payer: Self-pay

## 2020-12-08 MED ORDER — CYCLOBENZAPRINE HCL 10 MG PO TABS: 10.0000 mg | ORAL_TABLET | Freq: Every day | ORAL | 0 refills | Status: DC

## 2020-12-08 NOTE — Telephone Encounter (Signed)
Patient walked in stating that her pharmacy never received a script for a muscle relaxer from you. I looked and don't see where you sent anything in. She stated that she told you about the Zanaflex not helping and that you told her you would send in another muscle relaxer to take at bedtime.  PATIENT USES [facility]

## 2020-12-11 ENCOUNTER — Ambulatory Visit: Payer: Self-pay

## 2020-12-19 DIAGNOSIS — R32 Unspecified urinary incontinence: Secondary | ICD-10-CM | POA: Diagnosis not present

## 2020-12-31 ENCOUNTER — Ambulatory Visit (HOSPITAL_COMMUNITY): Payer: Medicaid Other

## 2021-01-01 ENCOUNTER — Ambulatory Visit (HOSPITAL_COMMUNITY): Payer: Medicaid Other | Attending: Orthopaedic Surgery | Admitting: Physical Therapy

## 2021-01-01 DIAGNOSIS — G8929 Other chronic pain: Secondary | ICD-10-CM | POA: Insufficient documentation

## 2021-01-01 DIAGNOSIS — M545 Low back pain, unspecified: Secondary | ICD-10-CM | POA: Insufficient documentation

## 2021-01-01 DIAGNOSIS — M6281 Muscle weakness (generalized): Secondary | ICD-10-CM | POA: Insufficient documentation

## 2021-01-05 ENCOUNTER — Other Ambulatory Visit: Payer: Self-pay | Admitting: Nurse Practitioner

## 2021-01-05 DIAGNOSIS — B20 Human immunodeficiency virus [HIV] disease: Secondary | ICD-10-CM

## 2021-01-06 ENCOUNTER — Telehealth: Payer: Self-pay | Admitting: Orthopaedic Surgery

## 2021-01-08 DIAGNOSIS — R32 Unspecified urinary incontinence: Secondary | ICD-10-CM | POA: Diagnosis not present

## 2021-01-09 ENCOUNTER — Ambulatory Visit (HOSPITAL_COMMUNITY): Payer: Medicaid Other | Admitting: Physical Therapy

## 2021-01-09 ENCOUNTER — Other Ambulatory Visit: Payer: Self-pay

## 2021-01-09 DIAGNOSIS — M6281 Muscle weakness (generalized): Secondary | ICD-10-CM

## 2021-01-09 DIAGNOSIS — M545 Low back pain, unspecified: Secondary | ICD-10-CM | POA: Diagnosis not present

## 2021-01-09 DIAGNOSIS — G8929 Other chronic pain: Secondary | ICD-10-CM

## 2021-01-09 NOTE — Therapy (Signed)
[place], [place], [address] Phone: [phone]   Fax:  [phone]  Physical Therapy Evaluation  Patient Details  Name: Chelsey Henson MRN: [id number] Date of Birth: Aug 17, 1970 Referring Provider (PT): Chelsey Henson   Encounter Date: 01/09/2021   PT End of Session - 01/09/21 1344     Visit Number 1    Number of Visits 12    Date for PT Re-Evaluation 02/20/21    Authorization Type medicaid-put in    Progress Note Due on Visit 10    PT Start Time 1320    PT Stop Time 1350    PT Time Calculation (min) 30 min    Activity Tolerance Patient tolerated treatment well    Behavior During Therapy [facility] for tasks assessed/performed             Past Medical History:  Diagnosis Date   Anemia    Anxiety    Asthma    COPD (chronic obstructive pulmonary disease) ([place]) 04/05/2016   Phreesia 05/28/2020   Depression    Depression    Phreesia 05/28/2020   Genital herpes    GERD (gastroesophageal reflux disease)    HIV (human immunodeficiency virus infection) ([place]) 11/24/2017   HIV infection ([place]) 2008   Hypertension    Sleep apnea    Substance abuse ([place])    cocaine- last use was Oct 2009   Vaginal Pap smear, abnormal     Past Surgical History:  Procedure Laterality Date   ABDOMINAL HYSTERECTOMY     BENIGN MASS; total hysterectomy   CESAREAN SECTION     ESOPHAGOGASTRODUODENOSCOPY N/A 06/30/2017   Procedure: ESOPHAGOGASTRODUODENOSCOPY (EGD);  Surgeon: Chelsey Houston, MD;  Location[facility];  Service: Endoscopy;  Laterality: N/A;  2:00   HERNIA REPAIR  2017    There were no vitals filed for this visit.    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  Her pain is in the lower back equal bilaterally with Rt pain going into the hip.    Pertinent  History anxiety; Rt OA has hx of getting cortisone shots in it on a regular basis    Limitations Walking;Standing;Lifting    How long can you sit comfortably? usually not a problem unless it has already been irritated    How long can you stand comfortably? 6-8 hr    How long can you walk comfortably? 6-8 hr    Patient Stated Goals less pain    Currently in Pain? Yes    Pain Score 5    worst 10; best 3   Pain Location Back    Pain Orientation Lower    Pain Descriptors / Indicators Aching    Pain Type Chronic pain    Pain Onset More than a month ago    Pain Frequency Constant    Aggravating Factors  weight bearing    Pain Relieving Factors heat  and mm relaxors    Effect of Pain on Daily Activities just works thru it                [facility] PT Assessment - 01/09/21 0001       Assessment   Medical Diagnosis Low back pain    Referring Provider (PT) Chelsey Henson    Onset Date/Surgical Date --  chronic   Next MD Visit not scheduled    Prior Therapy none      Precautions   Precautions None      Restrictions   Weight Bearing Restrictions No      Balance Screen   Has the patient fallen in the past 6 months No    Has the patient had a decrease in activity level because of a fear of falling?  No    Is the patient reluctant to leave their home because of a fear of falling?  No      Home Environment   Living Environment Private residence    Type of [place] Access Level entry      Prior Function   Level of Independence Independent      Cognition   Overall Cognitive Status Within Functional Limits for tasks assessed      ROM / Strength   AROM / PROM / Strength AROM;Strength      AROM   AROM Assessment Site Lumbar    Lumbar Flexion fingers 12" from floor   reps increase pain pain is increased by coming up   Lumbar Extension 25   reps increase pain     Strength   Strength Assessment Site Hip;Knee;Ankle    Right/Left Hip Right;Left    Right Hip Flexion 4+/5     Right Hip Extension 4-/5    Right Hip ABduction 5/5    Left Hip Flexion 4/5    Left Hip Extension 4-/5    Left Hip ABduction 5/5    Right/Left Knee Right;Left    Right Knee Extension 3+/5    Left Knee Extension 3+/5    Right/Left Ankle Right;Left    Right Ankle Dorsiflexion 5/5    Left Ankle Dorsiflexion 5/5                        Objective measurements completed on examination: See above findings.                PT Education - 01/09/21 1344     Education Details HEP    Person(s) Educated Patient    Methods Explanation    Comprehension Verbalized understanding              PT Short Term Goals - 01/09/21 1356       PT SHORT TERM GOAL #1   Title Pt to be I in HEP to decrease her pain to no greater than a 7/10    Time 3    Period Weeks    Status New    Target Date 01/30/21      PT SHORT TERM GOAL #2   Title PT ROM in hip and back to be improved to allow pt to bend down to pick an item off the ground without difficulty.    Time 3    Period Weeks    Status New               PT Long Term Goals - 01/09/21 1358       PT LONG TERM GOAL #1   Title PT to be I in advanced HEP to allow LE mm to be at least 4+/5 to allow pt to work a full day without her pain increasing over 3/10    Time 6    Period Weeks    Status New    Target Date 02/20/21      PT LONG  TERM GOAL #2   Title PT to be able to be on her feet for six hours without having increased pain    Time 6    Period Weeks                    Plan - 01/09/21 1351     Clinical Impression Statement Chelsey Henson is a 49 yo female who has had increased low back pain for years.  The pain exacerbated in the last three years but she just lived with it until she was fed up and went to the MD who has referred her to OP PT.  Evaluation demonstrates decreased ROM, decreased strength, and increased pain.  Chelsey Henson will benefit from skilled PT to improve her core strength and  flexibility to assist in decreasing her pain.    Personal Factors and Comorbidities Fitness;Time since onset of injury/illness/exacerbation;Comorbidity 2    Comorbidities obesity, chronic Rt knee pain, chronic hx of back pain    Examination-Activity Limitations Lift;Locomotion Level    Examination-Participation Restrictions Community Activity;Occupation;Shop    Stability/Clinical Decision Making Evolving/Moderate complexity    Clinical Decision Making Moderate    Rehab Potential Fair    PT Frequency 2x / week    PT Duration 6 weeks    PT Treatment/Interventions Therapeutic activities;Therapeutic exercise;Patient/family education;Manual techniques    PT Next Visit Plan begin lumbar stabilization progressing supine, prone to standing    PT Home Exercise Plan ab and glut set LAQ             Patient will benefit from skilled therapeutic intervention in order to improve the following deficits and impairments:  Decreased activity tolerance, Decreased range of motion, Obesity, Pain, Decreased strength  Visit Diagnosis: Muscle weakness (generalized)  Chronic bilateral low back pain without sciatica     Problem List Patient Active Problem List   Diagnosis Date Noted   Rash 07/23/2020   Post traumatic stress disorder (PTSD) 04/30/2019   Anxiety 04/30/2019   Encounter to establish care 10/27/2018   Transaminitis 05/24/2017   Gastroesophageal reflux disease without esophagitis 04/06/2017   MDD (major depressive disorder), recurrent episode, moderate ([place]) 09/08/2016   Cocaine use disorder, moderate, in sustained remission ([place]) 09/08/2016   OSA (obstructive sleep apnea) 04/15/2016   Asthma 04/15/2016   HIV (human immunodeficiency virus infection) ([place]) 04/05/2016   Essential hypertension 04/05/2016   Rayetta Humphrey, PT CLT [phone]  01/09/2021, 2:02 PM  [place], [place], [address] Phone: [phone]    Fax:  [phone]  Name: Chelsey Henson MRN: [id number] Date of Birth: [birth year]

## 2021-01-13 ENCOUNTER — Ambulatory Visit (HOSPITAL_COMMUNITY): Payer: Medicaid Other | Admitting: Physical Therapy

## 2021-01-13 ENCOUNTER — Telehealth (HOSPITAL_COMMUNITY): Payer: Self-pay | Admitting: Physical Therapy

## 2021-01-13 NOTE — Telephone Encounter (Signed)
Left VM for missed appt, told pt that this PT has openings at 11:15, 1, 1:45 if she wants to call back to reschedule. Informed of next appt.  10:09 AM,01/13/21 Domenic Moras, PT, DPT Physical Therapist at [facility]

## 2021-01-14 ENCOUNTER — Other Ambulatory Visit: Payer: Self-pay | Admitting: Family Medicine

## 2021-01-14 DIAGNOSIS — G4733 Obstructive sleep apnea (adult) (pediatric): Secondary | ICD-10-CM | POA: Diagnosis not present

## 2021-01-15 ENCOUNTER — Ambulatory Visit (HOSPITAL_COMMUNITY): Payer: Medicaid Other | Admitting: Physical Therapy

## 2021-01-15 ENCOUNTER — Ambulatory Visit: Payer: Medicaid Other | Admitting: Orthopaedic Surgery

## 2021-01-15 ENCOUNTER — Other Ambulatory Visit: Payer: Self-pay

## 2021-01-15 DIAGNOSIS — M6281 Muscle weakness (generalized): Secondary | ICD-10-CM | POA: Diagnosis not present

## 2021-01-15 DIAGNOSIS — G8929 Other chronic pain: Secondary | ICD-10-CM | POA: Diagnosis not present

## 2021-01-15 DIAGNOSIS — M545 Low back pain, unspecified: Secondary | ICD-10-CM | POA: Diagnosis not present

## 2021-01-15 NOTE — Therapy (Signed)
[place], [place], [address] Phone: [phone]   Fax:  [phone]  Physical Therapy Treatment  Patient Details  Name: Chelsey Henson MRN: [id number] Date of Birth: 1970/05/07 Referring Provider (PT): Sanjuana Kava   Encounter Date: 01/15/2021   PT End of Session - 01/15/21 1614     Visit Number 2    Number of Visits 12    Date for PT Re-Evaluation 02/20/21    Authorization Type medicaid-put in    Authorization Time Period 3 visits from 11/15 thru 11/28    Authorization - Visit Number 1    Authorization - Number of Visits 3    Progress Note Due on Visit 10    PT Start Time 1615    PT Stop Time 1655    PT Time Calculation (min) 40 min    Activity Tolerance Patient tolerated treatment well    Behavior During Therapy [facility] for tasks assessed/performed             Past Medical History:  Diagnosis Date   Anemia    Anxiety    Asthma    COPD (chronic obstructive pulmonary disease) ([place]) 04/05/2016   Phreesia 05/28/2020   Depression    Depression    Phreesia 05/28/2020   Genital herpes    GERD (gastroesophageal reflux disease)    HIV (human immunodeficiency virus infection) ([place]) 11/24/2017   HIV infection ([place]) 2008   Hypertension    Sleep apnea    Substance abuse ([place])    cocaine- last use was Oct 2009   Vaginal Pap smear, abnormal     Past Surgical History:  Procedure Laterality Date   ABDOMINAL HYSTERECTOMY     BENIGN MASS; total hysterectomy   CESAREAN SECTION     ESOPHAGOGASTRODUODENOSCOPY N/A 06/30/2017   Procedure: ESOPHAGOGASTRODUODENOSCOPY (EGD);  Surgeon: Rogene Houston, MD;  [facility];  Service: Endoscopy;  Laterality: N/A;  2:00   HERNIA REPAIR  2017    There were no vitals filed for this visit.   Subjective Assessment - 01/15/21 1615     Subjective PT states that she has only done her exercise about three times since the evaluation.    Pertinent History  anxiety; Rt OA has hx of getting cortisone shots in it on a regular basis    Limitations Walking;Standing;Lifting    How long can you sit comfortably? usually not a problem unless it has already been irritated    How long can you stand comfortably? 6-8 hr    How long can you walk comfortably? 6-8 hr    Patient Stated Goals less pain    Currently in Pain? Yes    Pain Score 4     Pain Location Back    Pain Descriptors / Indicators Aching    Pain Onset More than a month ago    Pain Frequency Constant    Aggravating Factors  WB    Pain Relieving Factors heat and mm relaxors    Effect of Pain on Daily Activities works thru is                               [facility] Adult PT Treatment/Exercise - 01/15/21 0001       Lumbar Exercises: Standing   Heel Raises 10 reps      Lumbar Exercises: Seated   Long Arc Quad on Chair Strengthening;Both;10 reps    Other  Seated Lumbar Exercises ab/glut set x 10      Lumbar Exercises: Supine   Ab Set 10 reps    Glut Set 10 reps    Bent Knee Raise 10 reps      Lumbar Exercises: Sidelying   Clam 10 reps    Other Sidelying Lumbar Exercises ITband stretch 3 x 20"                       PT Short Term Goals - 01/15/21 1646       PT SHORT TERM GOAL #1   Title Pt to be I in HEP to decrease her pain to no greater than a 7/10    Time 3    Period Weeks    Status On-going    Target Date 01/30/21      PT SHORT TERM GOAL #2   Title PT ROM in hip and back to be improved to allow pt to bend down to pick an item off the ground without difficulty.    Time 3    Period Weeks    Status On-going               PT Long Term Goals - 01/15/21 1646       PT LONG TERM GOAL #1   Title PT to be I in advanced HEP to allow LE mm to be at least 4+/5 to allow pt to work a full day without her pain increasing over 3/10    Time 6    Status On-going      PT LONG TERM GOAL #2   Title PT to be able to be on her feet for six hours  without having increased pain    Time 6    Period Weeks    Status On-going      PT LONG TERM GOAL #3   Title Pt will be able to walk for at least 1/2 mile    Time 6    Status On-going                   Plan - 01/15/21 1702     Clinical Impression Statement Therapist stressed to pt the iimportance of completing HEP if pt wants her pain to decreased.  Therapist and pt spoke about the health benefits of walking.  Began beginning stabilization exercises attempted bridge but pt was unable to complete therefore pt given glut sets as a HEP    Personal Factors and Comorbidities Fitness;Time since onset of injury/illness/exacerbation;Comorbidity 2    Comorbidities obesity, chronic Rt knee pain, chronic hx of back pain    Examination-Activity Limitations Lift;Locomotion Level    Examination-Participation Restrictions Community Activity;Occupation;Shop    Stability/Clinical Decision Making Evolving/Moderate complexity    Rehab Potential Fair    PT Frequency 2x / week    PT Duration 6 weeks    PT Treatment/Interventions Therapeutic activities;Therapeutic exercise;Patient/family education;Manual techniques    PT Next Visit Plan begin lumbar stabilization progressing supine, prone to standing    PT Home Exercise Plan ab and glut set LAQ             Patient will benefit from skilled therapeutic intervention in order to improve the following deficits and impairments:  Decreased activity tolerance, Decreased range of motion, Obesity, Pain, Decreased strength  Visit Diagnosis: Muscle weakness (generalized)  Chronic bilateral low back pain without sciatica     Problem List Patient Active Problem List   Diagnosis Date Noted  Rash 07/23/2020   Post traumatic stress disorder (PTSD) 04/30/2019   Anxiety 04/30/2019   Encounter to establish care 10/27/2018   Transaminitis 05/24/2017   Gastroesophageal reflux disease without esophagitis 04/06/2017   MDD (major depressive  disorder), recurrent episode, moderate ([place]) 09/08/2016   Cocaine use disorder, moderate, in sustained remission ([place]) 09/08/2016   OSA (obstructive sleep apnea) 04/15/2016   Asthma 04/15/2016   HIV (human immunodeficiency virus infection) ([place]) 04/05/2016   Essential hypertension 04/05/2016   Rayetta Humphrey, PT CLT [phone]  01/15/2021, 5:05 PM  [place], [place], [address] Phone: [phone]   Fax:  [phone]  Name: Chelsey Henson MRN: [id number] Date of Birth: [birth year]

## 2021-01-19 ENCOUNTER — Encounter (HOSPITAL_COMMUNITY): Payer: Medicaid Other | Admitting: Physical Therapy

## 2021-01-21 ENCOUNTER — Encounter (HOSPITAL_COMMUNITY): Payer: Medicaid Other | Admitting: Physical Therapy

## 2021-01-21 IMAGING — DX DG FOOT COMPLETE 3+V*L*
3 series · 3 of 3 positions shown · non-contrast
Comparison: None.

CLINICAL DATA: Injured foot 3 days ago. Persistent pain and
bruising along the lateral aspect of the foot.

EXAM:
LEFT FOOT - COMPLETE 3+ VIEW

[foot ap]
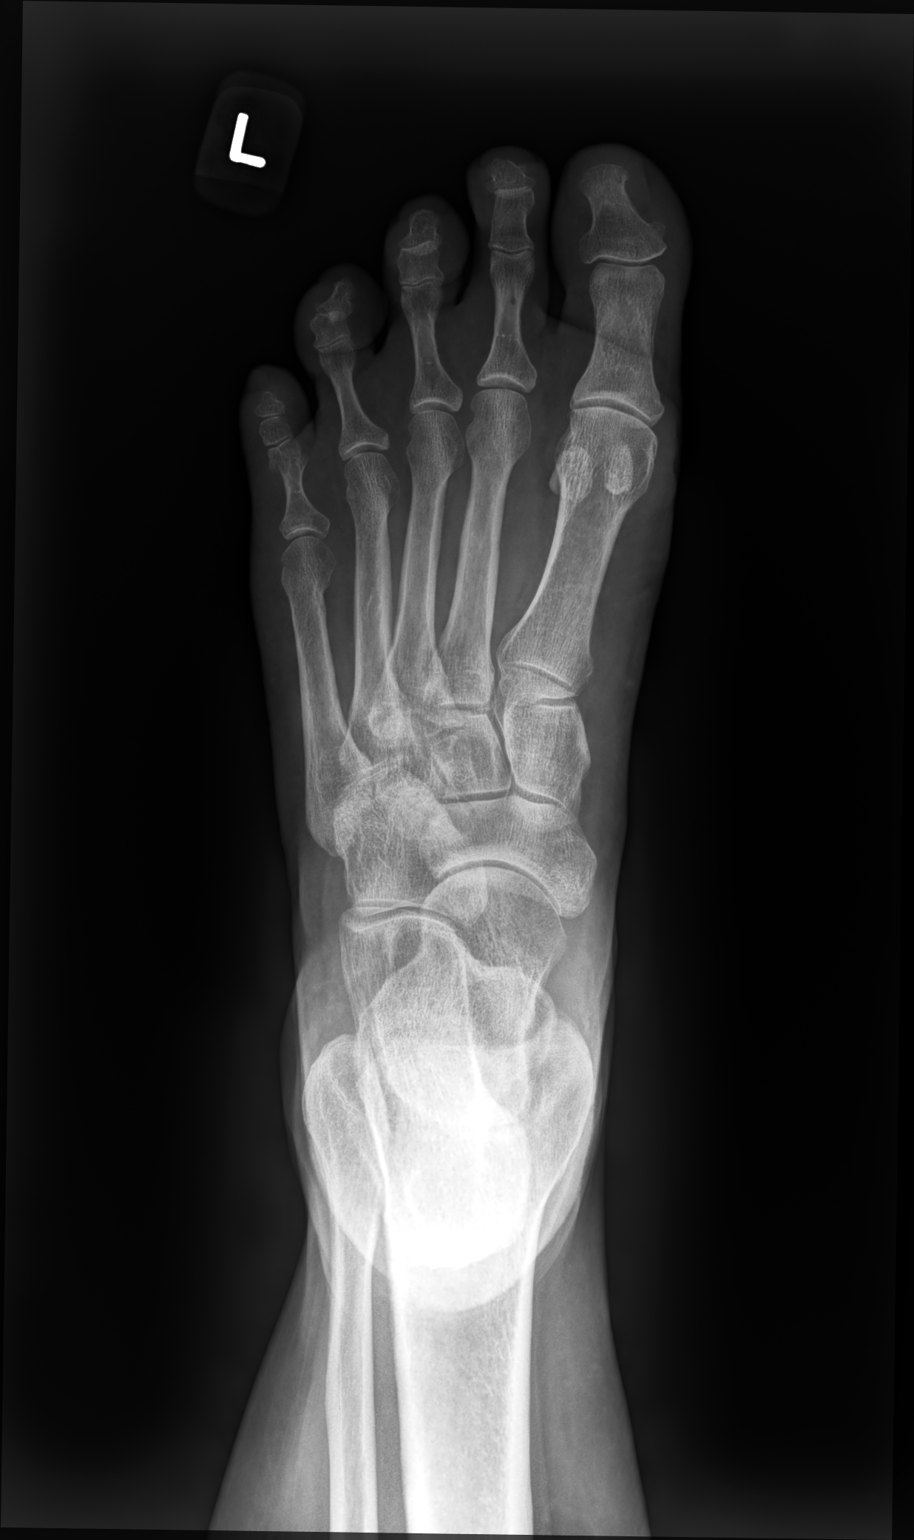

[foot mlo]
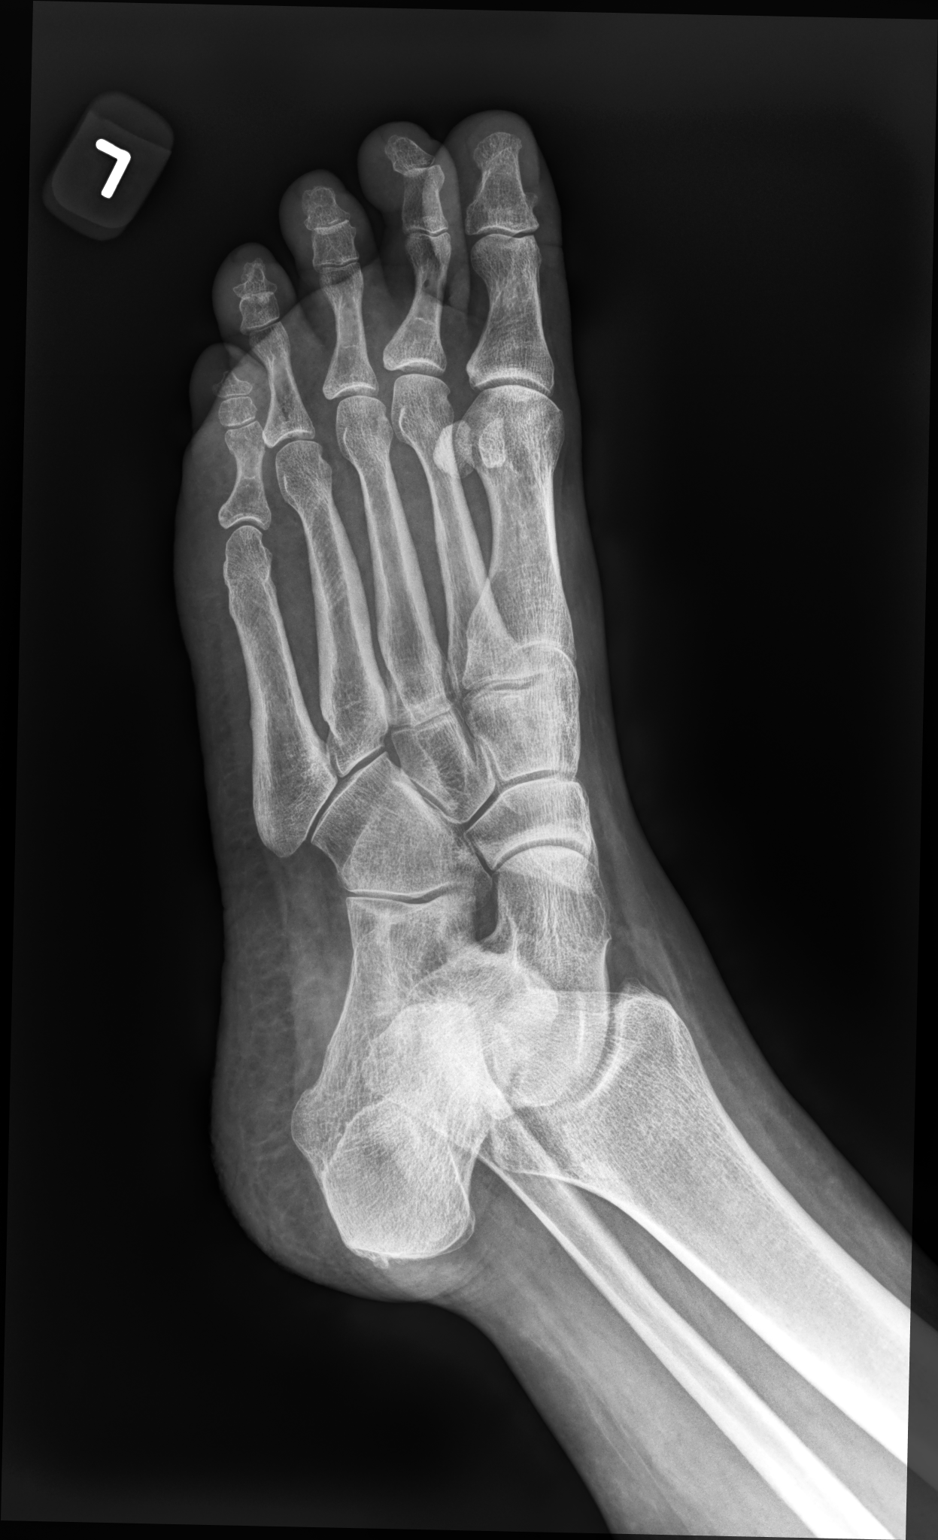

[foot lat]
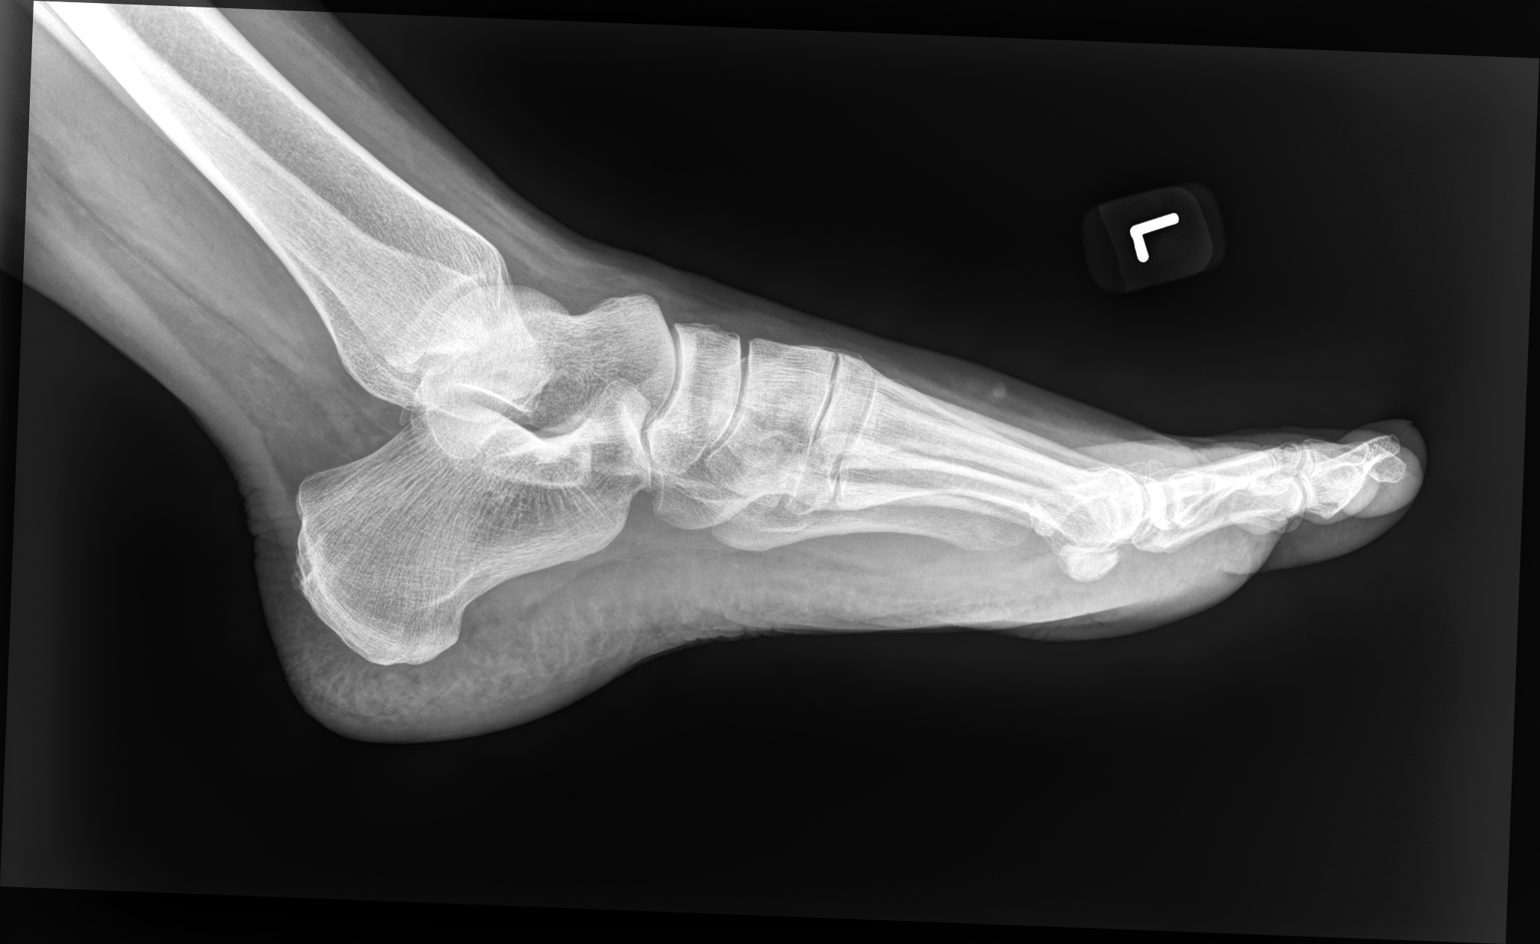

[3 of 3 positions shown; findings below may reference images not displayed]

FINDINGS: The joint spaces are maintained.  No arthropathic findings.

There is a mildly displaced transverse fracture through the distal
aspect of the proximal phalanx of the fifth toe. No definite
intra-articular involvement.
IMPRESSION: Mildly displaced fracture involving the proximal phalanx of the
fifth toe.

## 2021-01-26 ENCOUNTER — Other Ambulatory Visit: Payer: Self-pay | Admitting: Nurse Practitioner

## 2021-01-26 DIAGNOSIS — B20 Human immunodeficiency virus [HIV] disease: Secondary | ICD-10-CM

## 2021-01-27 ENCOUNTER — Ambulatory Visit (HOSPITAL_COMMUNITY): Payer: Medicaid Other

## 2021-01-27 ENCOUNTER — Encounter (HOSPITAL_COMMUNITY): Payer: Self-pay | Admitting: Physical Therapy

## 2021-01-27 NOTE — Therapy (Signed)
[place], [place], [address] Phone: [phone]   Fax:  [phone]  Patient Details  Name: Chelsey Henson MRN: [id number] Date of Birth: [birth year] Referring Provider:  Sanjuana Kava   Encounter Date: 01/27/2021  PHYSICAL THERAPY DISCHARGE SUMMARY  Visits from Start of Care: 2  Current functional level related to goals / functional outcomes: PT had not done her exercises; did not note any difference    Remaining deficits: Continues to have pain as pt did not her exercises.    Education / Equipment: HEP   Patient agrees to discharge. Patient goals were not met. Patient is being discharged due to the patient's request.   PT states that her life is to busy right now to complete therapy.   Rayetta Humphrey, PT CLT [phone]  01/27/2021, 11:02 AM  [place], [place], [address] Phone: [phone]   Fax:  [phone]

## 2021-01-28 ENCOUNTER — Ambulatory Visit: Payer: Medicaid Other | Admitting: Nurse Practitioner

## 2021-01-29 ENCOUNTER — Encounter (HOSPITAL_COMMUNITY): Payer: Medicaid Other

## 2021-01-29 DIAGNOSIS — N393 Stress incontinence (female) (male): Secondary | ICD-10-CM | POA: Diagnosis not present

## 2021-01-29 DIAGNOSIS — R32 Unspecified urinary incontinence: Secondary | ICD-10-CM | POA: Diagnosis not present

## 2021-02-03 ENCOUNTER — Encounter (HOSPITAL_COMMUNITY): Payer: Medicaid Other | Admitting: Physical Therapy

## 2021-02-05 ENCOUNTER — Encounter (HOSPITAL_COMMUNITY): Payer: Medicaid Other

## 2021-02-10 ENCOUNTER — Encounter (HOSPITAL_COMMUNITY): Payer: Medicaid Other | Admitting: Physical Therapy

## 2021-02-12 ENCOUNTER — Encounter (HOSPITAL_COMMUNITY): Payer: Medicaid Other | Admitting: Physical Therapy

## 2021-02-13 ENCOUNTER — Other Ambulatory Visit: Payer: Self-pay

## 2021-02-13 DIAGNOSIS — Z113 Encounter for screening for infections with a predominantly sexual mode of transmission: Secondary | ICD-10-CM

## 2021-02-13 DIAGNOSIS — Z79899 Other long term (current) drug therapy: Secondary | ICD-10-CM

## 2021-02-13 DIAGNOSIS — B20 Human immunodeficiency virus [HIV] disease: Secondary | ICD-10-CM

## 2021-02-17 ENCOUNTER — Other Ambulatory Visit (HOSPITAL_COMMUNITY)
Admission: RE | Admit: 2021-02-17 | Discharge: 2021-02-17 | Disposition: A | Discharge status: Home / Self Care | Payer: Medicaid Other | Source: Ambulatory Visit | Attending: Internal Medicine | Admitting: Internal Medicine

## 2021-02-17 ENCOUNTER — Other Ambulatory Visit: Payer: Medicaid Other

## 2021-02-17 ENCOUNTER — Encounter (HOSPITAL_COMMUNITY): Payer: Medicaid Other | Admitting: Physical Therapy

## 2021-02-17 ENCOUNTER — Other Ambulatory Visit: Payer: Self-pay

## 2021-02-17 DIAGNOSIS — Z113 Encounter for screening for infections with a predominantly sexual mode of transmission: Secondary | ICD-10-CM | POA: Diagnosis not present

## 2021-02-17 DIAGNOSIS — B20 Human immunodeficiency virus [HIV] disease: Secondary | ICD-10-CM | POA: Diagnosis not present

## 2021-02-17 DIAGNOSIS — Z79899 Other long term (current) drug therapy: Secondary | ICD-10-CM

## 2021-02-18 ENCOUNTER — Other Ambulatory Visit: Payer: Medicaid Other

## 2021-02-18 LAB — T-HELPER CELL (CD4) - (RCID CLINIC ONLY)
CD4 % Helper T Cell: 43 % (ref 33–65)
CD4 T Cell Abs: 826 /uL (ref 400–1790)

## 2021-02-18 LAB — URINE CYTOLOGY ANCILLARY ONLY
Chlamydia: NEGATIVE
Comment: NEGATIVE
Comment: NORMAL
Neisseria Gonorrhea: NEGATIVE

## 2021-02-19 LAB — CBC WITH DIFFERENTIAL/PLATELET
Absolute Monocytes: 296 cells/uL (ref 200–950)
Basophils Absolute: 69 cells/uL (ref 0–200)
Basophils Relative: 1.1 %
Eosinophils Absolute: 221 cells/uL (ref 15–500)
Eosinophils Relative: 3.5 %
HCT: 38.7 % (ref 35.0–45.0)
Hemoglobin: 13.4 g/dL (ref 11.7–15.5)
Lymphs Abs: 1827 cells/uL (ref 850–3900)
MCH: 31.5 pg (ref 27.0–33.0)
MCHC: 34.6 g/dL (ref 32.0–36.0)
MCV: 91.1 fL (ref 80.0–100.0)
MPV: 12.3 fL (ref 7.5–12.5)
Monocytes Relative: 4.7 %
Neutro Abs: 3887 cells/uL (ref 1500–7800)
Neutrophils Relative %: 61.7 %
Platelets: 185 10*3/uL (ref 140–400)
RBC: 4.25 10*6/uL (ref 3.80–5.10)
RDW: 12.9 % (ref 11.0–15.0)
Total Lymphocyte: 29 %
WBC: 6.3 10*3/uL (ref 3.8–10.8)

## 2021-02-19 LAB — COMPLETE METABOLIC PANEL WITH GFR
AG Ratio: 1.5 (calc) (ref 1.0–2.5)
ALT: 169 U/L — ABNORMAL HIGH (ref 6–29)
AST: 108 U/L — ABNORMAL HIGH (ref 10–35)
Albumin: 4.4 g/dL (ref 3.6–5.1)
Alkaline phosphatase (APISO): 64 U/L (ref 31–125)
BUN: 10 mg/dL (ref 7–25)
CO2: 24 mmol/L (ref 20–32)
Calcium: 8.9 mg/dL (ref 8.6–10.2)
Chloride: 104 mmol/L (ref 98–110)
Creat: 0.68 mg/dL (ref 0.50–0.99)
Globulin: 2.9 g/dL (calc) (ref 1.9–3.7)
Glucose, Bld: 188 mg/dL — ABNORMAL HIGH (ref 65–99)
Potassium: 4 mmol/L (ref 3.5–5.3)
Sodium: 136 mmol/L (ref 135–146)
Total Bilirubin: 0.8 mg/dL (ref 0.2–1.2)
Total Protein: 7.3 g/dL (ref 6.1–8.1)
eGFR: 107 mL/min/{1.73_m2} (ref >=60)

## 2021-02-19 LAB — LIPID PANEL
Cholesterol: 147 mg/dL (ref <200)
HDL: 51 mg/dL (ref >=50)
LDL Cholesterol (Calc): 75 mg/dL (calc)
Non-HDL Cholesterol (Calc): 96 mg/dL (calc) (ref <130)
Total CHOL/HDL Ratio: 2.9 (calc) (ref <5.0)
Triglycerides: 130 mg/dL (ref <150)

## 2021-02-19 LAB — HIV-1 RNA QUANT-NO REFLEX-BLD
HIV 1 RNA Quant: NOT DETECTED Copies/mL
HIV-1 RNA Quant, Log: NOT DETECTED Log cps/mL

## 2021-02-19 LAB — RPR: RPR Ser Ql: NONREACTIVE

## 2021-03-01 DIAGNOSIS — N393 Stress incontinence (female) (male): Secondary | ICD-10-CM | POA: Diagnosis not present

## 2021-03-01 DIAGNOSIS — R32 Unspecified urinary incontinence: Secondary | ICD-10-CM | POA: Diagnosis not present

## 2021-03-05 ENCOUNTER — Encounter: Payer: Self-pay | Admitting: Internal Medicine

## 2021-03-05 ENCOUNTER — Ambulatory Visit (INDEPENDENT_AMBULATORY_CARE_PROVIDER_SITE_OTHER): Payer: Medicaid Other | Admitting: Internal Medicine

## 2021-03-05 ENCOUNTER — Other Ambulatory Visit: Payer: Self-pay

## 2021-03-05 DIAGNOSIS — B20 Human immunodeficiency virus [HIV] disease: Secondary | ICD-10-CM | POA: Diagnosis not present

## 2021-03-05 MED ORDER — GENVOYA 150-150-200-10 MG PO TABS: 1.0000 | ORAL_TABLET | Freq: Every day | ORAL | 11 refills | Status: DC

## 2021-03-05 NOTE — Assessment & Plan Note (Signed)
She is doing well despite sporadic follow up and she can continue to be seen yearly.  I encouraged vaccines and she will get locally to her.   Follow up in 1 year

## 2021-03-05 NOTE — Progress Notes (Signed)
° °  Subjective:    Patient ID: Chelsey Henson, female    DOB: 1970-09-02, 51 y.o.   MRN: [id number]  I connected with  Latorie Montesano on 03/05/21 by telephone and verified that I am speaking with the correct person using two identifiers.   I discussed the limitations of evaluation and management by telemedicine. The patient expressed understanding and agreed to proceed.  Location: Patient - home Physician - clinic  Duration of visit:  14 minutes  HPI She is called for follow up  She has not been seen in over 2 years but has continued on Genvoya with no missed doses.  CD4 826 and viral load not detected.  No concerns.    Review of Systems  Gastrointestinal:  Negative for diarrhea and nausea.      Objective:   Physical Exam Pulmonary:     Effort: Pulmonary effort is normal.  Neurological:     Mental Status: She is alert.   SH: no tobacco       Assessment & Plan:

## 2021-03-16 ENCOUNTER — Other Ambulatory Visit: Payer: Self-pay | Admitting: Family Medicine

## 2021-03-30 NOTE — Progress Notes (Incomplete)
H&P  Chief Complaint: ***  History of Present Illness: Chelsey Henson is a 51 y.o. year old female ***  Past Medical History:  Diagnosis Date   Anemia    Anxiety    Asthma    COPD (chronic obstructive pulmonary disease) ([place]) 04/05/2016   Phreesia 05/28/2020   Depression    Depression    Phreesia 05/28/2020   Genital herpes    GERD (gastroesophageal reflux disease)    HIV (human immunodeficiency virus infection) ([place]) 11/24/2017   HIV infection ([place]) 2008   Hypertension    Sleep apnea    Substance abuse ([place])    cocaine- last use was Oct 2009   Vaginal Pap smear, abnormal     Past Surgical History:  Procedure Laterality Date   ABDOMINAL HYSTERECTOMY     BENIGN MASS; total hysterectomy   CESAREAN SECTION     ESOPHAGOGASTRODUODENOSCOPY N/A 06/30/2017   Procedure: ESOPHAGOGASTRODUODENOSCOPY (EGD);  Surgeon: Rogene Houston, MD;  [facility];  Service: Endoscopy;  Laterality: N/A;  2:00   HERNIA REPAIR  2017    Home Medications:  (Not in a hospital admission)   Allergies:  Allergies  Allergen Reactions   Abilify [Aripiprazole] Other (See Comments)    Tardive dyskinesia   Codeine Itching    Family History  Problem Relation Age of Onset   Depression Mother    Hyperlipidemia Mother    Learning disabilities Mother    Alcohol abuse Mother 69   Alcohol abuse Father    Arthritis Father    Cancer Father    Heart disease Father    Hypertension Father    Learning disabilities Father    Alcohol abuse Sister        RECOVERED   Alcohol abuse Brother    Alcohol abuse Brother    Alcohol abuse Brother     Social History:  reports that she has never smoked. She has never used smokeless tobacco. She reports current alcohol use of about 1.0 standard drink per week. She reports that she does not use drugs.  ROS: A complete review of systems was performed.  All systems are negative except for pertinent findings as noted.  Physical Exam:  Vital signs in last  24 hours: @VSRANGES @ General:  Alert and oriented, No acute distress HEENT: Normocephalic, atraumatic Neck: No JVD or lymphadenopathy Cardiovascular: Regular rate  Lungs: Normal inspiratory/expiratory excursion Abdomen: Soft, nontender, nondistended, no abdominal masses Back: No CVA tenderness Extremities: No edema Neurologic: Grossly intact  I have reviewed prior pt notes  I have reviewed notes from referring/previous physicians  I have reviewed urinalysis results  I have independently reviewed prior imaging  I have reviewed prior urine culture   Impression/Assessment:  ***  Plan:  ***  Chelsey Henson 03/30/2021, 7:32 PM  Chelsey Boxer. Eniola Cerullo MD

## 2021-03-31 ENCOUNTER — Ambulatory Visit: Payer: Medicaid Other | Admitting: Urology

## 2021-04-01 DIAGNOSIS — R32 Unspecified urinary incontinence: Secondary | ICD-10-CM | POA: Diagnosis not present

## 2021-04-01 DIAGNOSIS — N393 Stress incontinence (female) (male): Secondary | ICD-10-CM | POA: Diagnosis not present

## 2021-04-17 ENCOUNTER — Other Ambulatory Visit: Payer: Self-pay

## 2021-04-17 ENCOUNTER — Ambulatory Visit (INDEPENDENT_AMBULATORY_CARE_PROVIDER_SITE_OTHER): Payer: Medicaid Other

## 2021-04-17 ENCOUNTER — Ambulatory Visit
Admission: EM | Admit: 2021-04-17 | Discharge: 2021-04-17 | Disposition: A | Discharge status: Home / Self Care | Payer: Medicaid Other | Attending: Urgent Care | Admitting: Urgent Care

## 2021-04-17 ENCOUNTER — Encounter: Payer: Self-pay | Admitting: Emergency Medicine

## 2021-04-17 DIAGNOSIS — W19XXXA Unspecified fall, initial encounter: Secondary | ICD-10-CM | POA: Diagnosis not present

## 2021-04-17 DIAGNOSIS — S8001XA Contusion of right knee, initial encounter: Secondary | ICD-10-CM | POA: Diagnosis not present

## 2021-04-17 DIAGNOSIS — M25561 Pain in right knee: Secondary | ICD-10-CM | POA: Diagnosis not present

## 2021-04-17 DIAGNOSIS — B20 Human immunodeficiency virus [HIV] disease: Secondary | ICD-10-CM

## 2021-04-17 MED ORDER — TIZANIDINE HCL 2 MG PO TABS: 2.0000 mg | ORAL_TABLET | Freq: Two times a day (BID) | ORAL | 0 refills | Status: DC | PRN

## 2021-04-17 MED ORDER — ACETAMINOPHEN 325 MG PO TABS: 650.0000 mg | ORAL_TABLET | Freq: Four times a day (QID) | ORAL | 0 refills | Status: DC | PRN

## 2021-04-17 NOTE — ED Triage Notes (Addendum)
Pt reports tripped today at work and landed on right knee. Pt reports increased pain with ambulation, right knee redness noted. No obvious deformity noted.

## 2021-04-17 NOTE — ED Provider Notes (Signed)
[place]   MRN: [id number] DOB: Feb 05, 1971  Subjective:   Chelsey Henson is a 51 y.o. female presenting for acute onset today of right knee pain with swelling.  Patient states that she tripped and fell landing directly onto her right knee onto hard ceramic floor.  Has since had difficulty bearing weight and has to walk with a limp.  She is able to move without assistance however.  Was sent from work to be checked.  No current facility-administered medications for this encounter.  Current Outpatient Medications:    acyclovir (ZOVIRAX) 800 MG tablet, TAKE (1) TABLET BY MOUTH ONCE DAILY., Disp: 90 tablet, Rfl: 0   albuterol (PROVENTIL HFA;VENTOLIN HFA) 108 (90 Base) MCG/ACT inhaler, Inhale 2 puffs into the lungs every 4 (four) hours as needed for wheezing or shortness of breath., Disp: 1 Inhaler, Rfl: 3   ALPRAZolam (XANAX) 0.5 MG tablet, Take 0.5 mg by mouth 3 (three) times daily as needed., Disp: , Rfl:    atorvastatin (LIPITOR) 40 MG tablet, Take 40 mg by mouth daily., Disp: , Rfl:    busPIRone (BUSPAR) 7.5 MG tablet, Take 7.5 mg by mouth 2 (two) times daily as needed. (Patient not taking: Reported on 03/05/2021), Disp: , Rfl:    cyclobenzaprine (FLEXERIL) 10 MG tablet, Take 1 tablet (10 mg total) by mouth at bedtime as needed for spasm. (Patient not taking: Reported on 03/05/2021), Disp: 30 tablet, Rfl: 0   elvitegravir-cobicistat-emtricitabine-tenofovir (GENVOYA) 150-150-200-10 MG TABS tablet, Take 1 tablet by mouth daily., Disp: 30 tablet, Rfl: 11   escitalopram (LEXAPRO) 20 MG tablet, TAKE (1) TABLET BY MOUTH ONCE DAILY., Disp: 90 tablet, Rfl: 0   estradiol (ESTRACE) 1 MG tablet, Take 1 mg by mouth every evening. , Disp: , Rfl:    famotidine (PEPCID) 40 MG tablet, TAKE (1) TABLET BY MOUTH TWICE DAILY. (Patient not taking: Reported on 03/05/2021), Disp: 60 tablet, Rfl: 0   furosemide (LASIX) 20 MG tablet, Take 1 tablet (20 mg total) by mouth daily for 5 days., Disp: 5 tablet,  Rfl: 0   Melatonin 1 MG CAPS, Take 2 mg by mouth at bedtime., Disp: , Rfl:    naproxen (NAPROSYN) 500 MG tablet, Take 1 tablet (500 mg total) by mouth 2 (two) times daily. (Patient not taking: Reported on 03/05/2021), Disp: 20 tablet, Rfl: 0   nystatin (MYCOSTATIN/NYSTOP) powder, Apply 1 application topically 2 (two) times daily as needed. (Patient not taking: Reported on 03/05/2021), Disp: 60 g, Rfl: 0   oxybutynin (DITROPAN-XL) 10 MG 24 hr tablet, TAKE (1) TABLET BY MOUTH AT BEDTIME. (Patient taking differently: Take 10 mg by mouth at bedtime.), Disp: 90 tablet, Rfl: 0   QUEtiapine (SEROQUEL) 100 MG tablet, Take 1 tablet (100 mg total) by mouth at bedtime., Disp: 90 tablet, Rfl: 0   tiZANidine (ZANAFLEX) 2 MG tablet, Take 1-2 tablets (2-4 mg total) by mouth 2 (two) times daily as needed for muscle spasms., Disp: 12 tablet, Rfl: 0   traZODone (DESYREL) 100 MG tablet, TAKE (1) TABLET BY MOUTH AT BEDTIME. (Patient not taking: Reported on 03/05/2021), Disp: 90 tablet, Rfl: 0   triamcinolone cream (KENALOG) 0.1 %, Apply 1 application topically 2 (two) times daily as needed., Disp: 80 g, Rfl: 0   Allergies  Allergen Reactions   Abilify [Aripiprazole] Other (See Comments)    Tardive dyskinesia   Codeine Itching    Past Medical History:  Diagnosis Date   Anemia    Anxiety    Asthma    COPD (  chronic obstructive pulmonary disease) ([place]) 04/05/2016   Phreesia 05/28/2020   Depression    Depression    Phreesia 05/28/2020   Genital herpes    GERD (gastroesophageal reflux disease)    HIV (human immunodeficiency virus infection) ([place]) 11/24/2017   HIV infection ([place]) 2008   Hypertension    Sleep apnea    Substance abuse ([place])    cocaine- last use was Oct 2009   Vaginal Pap smear, abnormal      Past Surgical History:  Procedure Laterality Date   ABDOMINAL HYSTERECTOMY     BENIGN MASS; total hysterectomy   CESAREAN SECTION     ESOPHAGOGASTRODUODENOSCOPY N/A 06/30/2017   Procedure:  ESOPHAGOGASTRODUODENOSCOPY (EGD);  Surgeon: Rogene Houston, MD;  [facility];  Service: Endoscopy;  Laterality: N/A;  2:00   HERNIA REPAIR  2017    Family History  Problem Relation Age of Onset   Depression Mother    Hyperlipidemia Mother    Learning disabilities Mother    Alcohol abuse Mother 65   Alcohol abuse Father    Arthritis Father    Cancer Father    Heart disease Father    Hypertension Father    Learning disabilities Father    Alcohol abuse Sister        RECOVERED   Alcohol abuse Brother    Alcohol abuse Brother    Alcohol abuse Brother     Social History   Tobacco Use   Smoking status: Never   Smokeless tobacco: Never  Vaping Use   Vaping Use: Never used  Substance Use Topics   Alcohol use: Yes    Alcohol/week: 1.0 standard drink    Types: 1 Cans of beer per week    Comment: occ.   Drug use: No    Comment: none in 7 years    ROS   Objective:   Vitals: BP 133/85 (BP Location: Right Arm)    Pulse 73    Temp 97.8 F (36.6 C) (Oral)    Resp 18    Ht 5' 4"  (1.626 m)    Wt 240 lb (108.9 kg)    LMP 09/30/2014 (Approximate)    SpO2 95%    BMI 41.20 kg/m   Physical Exam Constitutional:      General: She is not in acute distress.    Appearance: Normal appearance. She is well-developed. She is not ill-appearing, toxic-appearing or diaphoretic.  HENT:     Head: Normocephalic and atraumatic.     Nose: Nose normal.     Mouth/Throat:     Mouth: Mucous membranes are moist.  Eyes:     General: No scleral icterus.       Right eye: No discharge.        Left eye: No discharge.     Extraocular Movements: Extraocular movements intact.  Cardiovascular:     Rate and Rhythm: Normal rate.  Pulmonary:     Effort: Pulmonary effort is normal.  Musculoskeletal:     Right knee: Swelling and bony tenderness (patella) present. No deformity, effusion, erythema, ecchymosis, lacerations or crepitus. Decreased range of motion. Tenderness present over the  patellar tendon. No medial joint line or lateral joint line tenderness. Normal alignment and normal patellar mobility.  Skin:    General: Skin is warm and dry.  Neurological:     General: No focal deficit present.     Mental Status: She is alert and oriented to person, place, and time.  Psychiatric:  Mood and Affect: Mood normal.        Behavior: Behavior normal.        Thought Content: Thought content normal.        Judgment: Judgment normal.    DG Knee Complete 4 Views Right  Result Date: 04/17/2021 CLINICAL DATA:  Pain right knee EXAM: RIGHT KNEE - COMPLETE 4+ VIEW COMPARISON:  04/10/2019 FINDINGS: Lateral view is less than optimal showing grid lines. As far as seen, no recent fracture or dislocation is seen. There is no significant effusion. There are no focal lytic lesions. IMPRESSION: No recent fracture or dislocation is seen in the right knee. Electronically Signed   By: Elmer Picker M.D.   On: 04/17/2021 15:24    A 4 inch Ace wrap was applied to the right knee.  Assessment and Plan :   PDMP not reviewed this encounter.  1. Contusion of right knee, initial encounter   2. Acute pain of right knee   3. Accidental fall, initial encounter   4. HIV disease ([place])     Recommended conservative management for right knee contusion.  Use RICE method, Tylenol for pain given her heart history, difficulty with allergies to NSAIDs. Counseled patient on potential for adverse effects with medications prescribed/recommended today, ER and return-to-clinic precautions discussed, patient verbalized understanding.    Jaynee Eagles, [place] 04/17/21 1657

## 2021-04-21 ENCOUNTER — Ambulatory Visit: Payer: Medicaid Other | Admitting: Orthopaedic Surgery

## 2021-04-21 ENCOUNTER — Ambulatory Visit: Payer: Medicaid Other

## 2021-04-21 ENCOUNTER — Encounter: Payer: Self-pay | Admitting: Orthopaedic Surgery

## 2021-04-21 ENCOUNTER — Other Ambulatory Visit: Payer: Self-pay

## 2021-04-21 VITALS — BP 140/91 | HR 80 | Ht 64.0 in | Wt 243.2 lb

## 2021-04-21 DIAGNOSIS — W19XXXA Unspecified fall, initial encounter: Secondary | ICD-10-CM | POA: Diagnosis not present

## 2021-04-21 DIAGNOSIS — M25561 Pain in right knee: Secondary | ICD-10-CM

## 2021-04-21 NOTE — Progress Notes (Signed)
I fell and hurt my right knee.  She feel at home and hurt her right knee on 04-17-21.  She has swelling, popping and anterior pain. She has no wound, no redness, no numbness.  It is not improving with ice and Tylenol.  Right knee has ROM 0 to 105, anterior pain over distal patella, some medial pain, crepitus, slight effusion, limp right, NV intact, no distal edema.  X-rays were done of the right knee, reported separately.  Encounter Diagnosis  Name Primary?   Acute pain of right knee Yes   I am concerned about possible meniscus tear.  PROCEDURE NOTE:  The patient requests injections of the right knee , verbal consent was obtained.  The right knee was prepped appropriately after time out was performed.   Sterile technique was observed and injection of 1 cc of DepoMedrol 35m with several cc's of plain xylocaine. Anesthesia was provided by ethyl chloride and a 20-gauge needle was used to inject the knee area. The injection was tolerated well.  A band aid dressing was applied.  The patient was advised to apply ice later today and tomorrow to the injection sight as needed.  Return in two weeks.  Use ice, begin Aleve one bid pc  Call if any problem.  Precautions discussed.  Electronically Signed WSanjuana Kava MD 2/21/20232:54 PM

## 2021-04-27 ENCOUNTER — Telehealth: Payer: Self-pay

## 2021-04-27 NOTE — Telephone Encounter (Signed)
Patient is asking for a note to be out of work. Her knee is still hurting and the shot did not help at all. She is wanting to go ahead with the MRI.  Please advise

## 2021-04-30 ENCOUNTER — Ambulatory Visit
Admission: EM | Admit: 2021-04-30 | Discharge: 2021-04-30 | Disposition: A | Discharge status: Home / Self Care | Payer: Medicaid Other

## 2021-04-30 ENCOUNTER — Ambulatory Visit: Payer: Self-pay

## 2021-04-30 DIAGNOSIS — G4733 Obstructive sleep apnea (adult) (pediatric): Secondary | ICD-10-CM | POA: Diagnosis not present

## 2021-04-30 DIAGNOSIS — W19XXXA Unspecified fall, initial encounter: Secondary | ICD-10-CM | POA: Diagnosis not present

## 2021-04-30 DIAGNOSIS — M25561 Pain in right knee: Secondary | ICD-10-CM | POA: Diagnosis not present

## 2021-04-30 MED ORDER — CYCLOBENZAPRINE HCL 5 MG PO TABS: 5.0000 mg | ORAL_TABLET | Freq: Three times a day (TID) | ORAL | 0 refills | Status: DC | PRN

## 2021-04-30 NOTE — ED Provider Notes (Signed)
[facility]    CSN: [id number] Arrival date & time: 04/30/21  1259      History   Chief Complaint Chief Complaint  Patient presents with   Appointment    0100   Knee Pain    HPI Chelsey Henson is a 51 y.o. female.   Presenting today with 2-week history of right anterior knee pain after a fall landing directly onto the knee.  She states that she has had 2 x-rays and a cortisone shot through her orthopedist since the incident and feels somewhat better but with certain twisting and bending motions is still having pain and stiffness.  She states she does a lot of bending and twisting at work and that seems to be making it worse.  Aleve gives some relief.  Denies numbness, tingling, weakness, significant swelling, discoloration.  Awaiting MRI through orthopedics.   Past Medical History:  Diagnosis Date   Anemia    Anxiety    Asthma    COPD (chronic obstructive pulmonary disease) ([place]) 04/05/2016   Phreesia 05/28/2020   Depression    Depression    Phreesia 05/28/2020   Genital herpes    GERD (gastroesophageal reflux disease)    HIV (human immunodeficiency virus infection) ([place]) 11/24/2017   HIV infection ([place]) 2008   Hypertension    Sleep apnea    Substance abuse ([place])    cocaine- last use was Oct 2009   Vaginal Pap smear, abnormal     Patient Active Problem List   Diagnosis Date Noted   Rash 07/23/2020   Post traumatic stress disorder (PTSD) 04/30/2019   Anxiety 04/30/2019   Encounter to establish care 10/27/2018   Transaminitis 05/24/2017   Gastroesophageal reflux disease without esophagitis 04/06/2017   MDD (major depressive disorder), recurrent episode, moderate ([place]) 09/08/2016   Cocaine use disorder, moderate, in sustained remission ([place]) 09/08/2016   OSA (obstructive sleep apnea) 04/15/2016   Asthma 04/15/2016   HIV (human immunodeficiency virus infection) ([place]) 04/05/2016   Essential hypertension 04/05/2016    Past Surgical History:  Procedure  Laterality Date   ABDOMINAL HYSTERECTOMY     BENIGN MASS; total hysterectomy   CESAREAN SECTION     ESOPHAGOGASTRODUODENOSCOPY N/A 06/30/2017   Procedure: ESOPHAGOGASTRODUODENOSCOPY (EGD);  Surgeon: Rogene Houston, MD;  [facility];  Service: Endoscopy;  Laterality: N/A;  2:00   HERNIA REPAIR  2017    OB History     Gravida  6   Para  4   Term  4   Preterm      AB  2   Living  4      SAB  2   IAB      Ectopic      Multiple      Live Births               Home Medications    Prior to Admission medications   Medication Sig Start Date End Date Taking? Authorizing Provider  cyclobenzaprine (FLEXERIL) 5 MG tablet Take 1 tablet (5 mg total) by mouth 3 (three) times daily as needed for muscle spasms. Do not drink alcohol or drive while taking this medication. May cause drowsiness 04/30/21  Yes Volney American, PA-C  naproxen sodium (ALEVE) 220 MG tablet Take 220 mg by mouth.   Yes [provider]  acetaminophen (TYLENOL) 325 MG tablet Take 2 tablets (650 mg total) by mouth every 6 (six) hours as needed. 04/17/21   Jaynee Eagles, PA-C  acyclovir (ZOVIRAX)  800 MG tablet TAKE (1) TABLET BY MOUTH ONCE DAILY. 07/18/20   Noreene Larsson, NP  albuterol (PROVENTIL HFA;VENTOLIN HFA) 108 (90 Base) MCG/ACT inhaler Inhale 2 puffs into the lungs every 4 (four) hours as needed for wheezing or shortness of breath. 07/09/17   Noemi Chapel, MD  ALPRAZolam Duanne Moron) 0.5 MG tablet Take 0.5 mg by mouth 3 (three) times daily as needed. 10/20/20   [provider]  atorvastatin (LIPITOR) 40 MG tablet Take 40 mg by mouth daily.    [provider]  busPIRone (BUSPAR) 7.5 MG tablet Take 7.5 mg by mouth 2 (two) times daily as needed.    [provider]  cyclobenzaprine (FLEXERIL) 10 MG tablet Take 1 tablet (10 mg total) by mouth at bedtime as needed for spasm. 01/06/21   Sanjuana Kava, MD  elvitegravir-cobicistat-emtricitabine-tenofovir (GENVOYA)  150-150-200-10 MG TABS tablet Take 1 tablet by mouth daily. 03/05/21   Comer, Okey Regal, MD  escitalopram (LEXAPRO) 20 MG tablet TAKE (1) TABLET BY MOUTH ONCE DAILY. 06/12/20   Noreene Larsson, NP  estradiol (ESTRACE) 1 MG tablet Take 1 mg by mouth every evening.     [provider]  famotidine (PEPCID) 40 MG tablet TAKE (1) TABLET BY MOUTH TWICE DAILY. 02/13/20   Harvel Quale, MD  furosemide (LASIX) 20 MG tablet Take 1 tablet (20 mg total) by mouth daily for 5 days. 11/24/20 11/29/20  Vanessa Kick, MD  Melatonin 1 MG CAPS Take 2 mg by mouth at bedtime.    [provider]  naproxen (NAPROSYN) 500 MG tablet Take 1 tablet (500 mg total) by mouth 2 (two) times daily. 09/01/20   Scot Jun, FNP  nystatin (MYCOSTATIN/NYSTOP) powder Apply 1 application topically 2 (two) times daily as needed. 09/01/20   Scot Jun, FNP  oxybutynin (DITROPAN-XL) 10 MG 24 hr tablet TAKE (1) TABLET BY MOUTH AT BEDTIME. Patient taking differently: Take 10 mg by mouth at bedtime. 06/20/17   Raylene Everts, MD  QUEtiapine (SEROQUEL) 100 MG tablet Take 1 tablet (100 mg total) by mouth at bedtime. 06/06/19   [place] Crane, MD  tiZANidine (ZANAFLEX) 2 MG tablet Take 1-2 tablets (2-4 mg total) by mouth 2 (two) times daily as needed for muscle spasms. 04/17/21   Jaynee Eagles, PA-C  traZODone (DESYREL) 100 MG tablet TAKE (1) TABLET BY MOUTH AT BEDTIME. 11/20/20   Noreene Larsson, NP  triamcinolone cream (KENALOG) 0.1 % Apply 1 application topically 2 (two) times daily as needed. 09/01/20   Scot Jun, FNP  budesonide-formoterol (SYMBICORT) 160-4.5 MCG/ACT inhaler Inhale 2 puffs into the lungs 2 (two) times daily.  09/25/18  [provider]  montelukast (SINGULAIR) 10 MG tablet Take 1 tablet (10 mg total) by mouth at bedtime. 07/26/17 09/25/18  Comer, Okey Regal, MD    Family History Family History  Problem Relation Age of Onset   Depression Mother    Hyperlipidemia Mother    Learning  disabilities Mother    Alcohol abuse Mother 22   Alcohol abuse Father    Arthritis Father    Cancer Father    Heart disease Father    Hypertension Father    Learning disabilities Father    Alcohol abuse Sister        RECOVERED   Alcohol abuse Brother    Alcohol abuse Brother    Alcohol abuse Brother     Social History Social History   Tobacco Use   Smoking status: Never   Smokeless  tobacco: Never  Vaping Use   Vaping Use: Never used  Substance Use Topics   Alcohol use: Yes    Alcohol/week: 1.0 standard drink    Types: 1 Cans of beer per week    Comment: occ.   Drug use: No    Comment: none in 7 years     Allergies   Abilify [aripiprazole] and Codeine   Review of Systems Review of Systems Per HPI  Physical Exam Triage Vital Signs ED Triage Vitals  Enc Vitals Group     BP 04/30/21 1322 115/78     Pulse Rate 04/30/21 1322 93     Resp 04/30/21 1322 (!) 22     Temp 04/30/21 1322 98.4 F (36.9 C)     Temp Source 04/30/21 1322 Oral     SpO2 04/30/21 1322 97 %     Weight --      Height --      Head Circumference --      Peak Flow --      Pain Score 04/30/21 1323 7     Pain Loc --      Pain Edu? --      Excl. in [place]? --    No data found.  Updated Vital Signs BP 115/78 (BP Location: Right Arm)    Pulse 93    Temp 98.4 F (36.9 C) (Oral)    Resp (!) 22    LMP 09/30/2014 (Approximate)    SpO2 97%   Visual Acuity Right Eye Distance:   Left Eye Distance:   Bilateral Distance:    Right Eye Near:   Left Eye Near:    Bilateral Near:     Physical Exam Vitals and nursing note reviewed.  Constitutional:      Appearance: Normal appearance. She is not ill-appearing.  HENT:     Head: Atraumatic.  Eyes:     Extraocular Movements: Extraocular movements intact.     Conjunctiva/sclera: Conjunctivae normal.  Cardiovascular:     Rate and Rhythm: Normal rate and regular rhythm.     Heart sounds: Normal heart sounds.  Pulmonary:     Effort: Pulmonary effort is  normal.     Breath sounds: Normal breath sounds.  Musculoskeletal:        General: Swelling, tenderness and signs of injury present. Normal range of motion.     Cervical back: Normal range of motion and neck supple.     Comments: Trace edema diffusely right anterior knee.  Lateral tenderness to palpation, no bony deformity palpable.  Good passive range of motion.  Mildly antalgic gait.  Negative McMurray's and drawer testing  Skin:    General: Skin is warm and dry.     Findings: No bruising or erythema.  Neurological:     Mental Status: She is alert and oriented to person, place, and time.     Comments: Right lower extremity neurovascularly intact  Psychiatric:        Mood and Affect: Mood normal.        Thought Content: Thought content normal.        Judgment: Judgment normal.     UC Treatments / Results  Labs (all labs ordered are listed, but only abnormal results are displayed) Labs Reviewed - No data to display  EKG   Radiology No results found.  Procedures Procedures (including critical care time)  Medications Ordered in UC Medications - No data to display  Initial Impression / Assessment and Plan / UC Course  I have reviewed the triage vital signs and the nursing notes.  Pertinent labs & imaging results that were available during my care of the patient were reviewed by me and considered in my medical decision making (see chart for details).     We will start Flexeril, continue RICE protocol and close follow-up with orthopedics next week.  Work note given.  Final Clinical Impressions(s) / UC Diagnoses   Final diagnoses:  Acute pain of right knee  Fall, initial encounter   Discharge Instructions   None    ED Prescriptions     Medication Sig Dispense Auth. Provider   cyclobenzaprine (FLEXERIL) 5 MG tablet Take 1 tablet (5 mg total) by mouth 3 (three) times daily as needed for muscle spasms. Do not drink alcohol or drive while taking this medication. May  cause drowsiness 15 tablet Volney American, [place]      PDMP not reviewed this encounter.   Volney American, [place] 04/30/21 1416

## 2021-04-30 NOTE — ED Triage Notes (Signed)
Pt reports she fell at work and landed over right knee 2 weeks ago.  Pain is worse when pt bend the right knee. Aleve gives some relief.  ?

## 2021-05-02 DIAGNOSIS — N393 Stress incontinence (female) (male): Secondary | ICD-10-CM | POA: Diagnosis not present

## 2021-05-02 DIAGNOSIS — R32 Unspecified urinary incontinence: Secondary | ICD-10-CM | POA: Diagnosis not present

## 2021-05-05 ENCOUNTER — Ambulatory Visit: Payer: Medicaid Other | Admitting: Orthopaedic Surgery

## 2021-05-05 ENCOUNTER — Encounter: Payer: Self-pay | Admitting: Orthopaedic Surgery

## 2021-05-05 NOTE — Addendum Note (Signed)
Addended by: Derek Mound A on: 05/05/2021 02:42 PM   Modules accepted: Orders

## 2021-05-07 ENCOUNTER — Encounter (HOSPITAL_COMMUNITY): Payer: Self-pay | Admitting: *Deleted

## 2021-05-07 ENCOUNTER — Ambulatory Visit: Payer: Medicaid Other | Admitting: Orthopaedic Surgery

## 2021-05-07 ENCOUNTER — Emergency Department (HOSPITAL_COMMUNITY)
Admission: EM | Admit: 2021-05-07 | Discharge: 2021-05-07 | Disposition: A | Discharge status: Home / Self Care | Payer: Medicaid Other | Attending: Emergency Medicine | Admitting: Emergency Medicine

## 2021-05-07 DIAGNOSIS — M25561 Pain in right knee: Secondary | ICD-10-CM | POA: Diagnosis not present

## 2021-05-07 DIAGNOSIS — Y990 Civilian activity done for income or pay: Secondary | ICD-10-CM | POA: Insufficient documentation

## 2021-05-07 DIAGNOSIS — W010XXA Fall on same level from slipping, tripping and stumbling without subsequent striking against object, initial encounter: Secondary | ICD-10-CM | POA: Diagnosis not present

## 2021-05-07 MED ORDER — LIDOCAINE 5 % EX PTCH: 1.0000 | MEDICATED_PATCH | CUTANEOUS | 0 refills | Status: DC

## 2021-05-07 MED ORDER — LIDOCAINE 5 % EX PTCH
1.0000 | MEDICATED_PATCH | CUTANEOUS | Status: DC
  Administered 2021-05-07: 17:00:00 1 via TRANSDERMAL
  Filled 2021-05-07: qty 1

## 2021-05-07 NOTE — ED Triage Notes (Signed)
Right knee pain, fell 3 weeks ago, has had an xray and is scheduled for MRI, STATES SHE CANNOT DEAL WITH THE PAIN ?

## 2021-05-07 NOTE — Discharge Instructions (Signed)
Your exam suggest that you have a meniscal injury which means a cartilage injury which is why your x-rays have been negative for showing the source of your pain as cartilage does not show up on plain x-rays.  We cannot expedite your MRI test, please make sure you show up at the scheduled time for this is arranged by Dr. Luna Glasgow.  I am placing you in a knee immobilizer and given you crutches which should help with your pain significantly, minimizing knee movement and weightbearing should greatly relieve your pain.  We can also try a pain patch which we have placed.  If this is helpful a prescription will be waiting for you at your pharmacy for further treatment.  Please cut back on your ibuprofen taking the 800 mg only every 8 hours.  You may also continue taking your Tylenol. ?

## 2021-05-07 NOTE — ED Provider Notes (Cosign Needed)
[facility] EMERGENCY DEPARTMENT Provider Note   CSN: [id number] Arrival date & time: 05/07/21  1502     History  Chief Complaint  Patient presents with   Knee Pain    Chelsey Henson is a 51 y.o. female presenting for evaluation of persistent right knee pain.  She describes tripping and falling at work several weeks ago landing directly on her right flexed knee onto hard tile flooring and has had persistent pain since the event.  She has been seen both at her urgent care center at which time x-rays were negative for fracture and has also seen Dr. Luna Glasgow who is suspicious for possible meniscal injury.  She is scheduled for an MRI of her knee on March 22 for further evaluation of this injury.  She is currently taking ibuprofen and Tylenol and using an Ace wrap to protect the knee but has persistent pain despite these treatments.  She denies weakness in the joint, has no numbness distal to the injury site.  She does endorse pain that radiates up into her mid lateral hip region which is new, this pain was not there with the initial fall.  The history is provided by the patient.      Home Medications Prior to Admission medications   Medication Sig Start Date End Date Taking? Authorizing Provider  lidocaine (LIDODERM) 5 % Place 1 patch onto the skin daily. Remove & Discard patch within 12 hours or as directed by MD 05/07/21  Yes Chaeli Judy, Almyra Free, PA-C  acetaminophen (TYLENOL) 325 MG tablet Take 2 tablets (650 mg total) by mouth every 6 (six) hours as needed. 04/17/21   Jaynee Eagles, PA-C  acyclovir (ZOVIRAX) 800 MG tablet TAKE (1) TABLET BY MOUTH ONCE DAILY. 07/18/20   Noreene Larsson, NP  albuterol (PROVENTIL HFA;VENTOLIN HFA) 108 (90 Base) MCG/ACT inhaler Inhale 2 puffs into the lungs every 4 (four) hours as needed for wheezing or shortness of breath. 07/09/17   Noemi Chapel, MD  ALPRAZolam Duanne Moron) 0.5 MG tablet Take 0.5 mg by mouth 3 (three) times daily as needed. 10/20/20   [provider]   atorvastatin (LIPITOR) 40 MG tablet Take 40 mg by mouth daily.    [provider]  busPIRone (BUSPAR) 7.5 MG tablet Take 7.5 mg by mouth 2 (two) times daily as needed.    [provider]  cyclobenzaprine (FLEXERIL) 10 MG tablet Take 1 tablet (10 mg total) by mouth at bedtime as needed for spasm. 01/06/21   Sanjuana Kava, MD  cyclobenzaprine (FLEXERIL) 5 MG tablet Take 1 tablet (5 mg total) by mouth 3 (three) times daily as needed for muscle spasms. Do not drink alcohol or drive while taking this medication. May cause drowsiness 04/30/21   Volney American, PA-C  elvitegravir-cobicistat-emtricitabine-tenofovir (GENVOYA) 150-150-200-10 MG TABS tablet Take 1 tablet by mouth daily. 03/05/21   Comer, Okey Regal, MD  escitalopram (LEXAPRO) 20 MG tablet TAKE (1) TABLET BY MOUTH ONCE DAILY. 06/12/20   Noreene Larsson, NP  estradiol (ESTRACE) 1 MG tablet Take 1 mg by mouth every evening.     [provider]  famotidine (PEPCID) 40 MG tablet TAKE (1) TABLET BY MOUTH TWICE DAILY. 02/13/20   Harvel Quale, MD  furosemide (LASIX) 20 MG tablet Take 1 tablet (20 mg total) by mouth daily for 5 days. 11/24/20 11/29/20  Vanessa Kick, MD  Melatonin 1 MG CAPS Take 2 mg by mouth at bedtime.    [provider]  naproxen (NAPROSYN) 500 MG tablet Take  1 tablet (500 mg total) by mouth 2 (two) times daily. 09/01/20   Scot Jun, FNP  naproxen sodium (ALEVE) 220 MG tablet Take 220 mg by mouth.    [provider]  nystatin (MYCOSTATIN/NYSTOP) powder Apply 1 application topically 2 (two) times daily as needed. 09/01/20   Scot Jun, FNP  oxybutynin (DITROPAN-XL) 10 MG 24 hr tablet TAKE (1) TABLET BY MOUTH AT BEDTIME. Patient taking differently: Take 10 mg by mouth at bedtime. 06/20/17   Raylene Everts, MD  QUEtiapine (SEROQUEL) 100 MG tablet Take 1 tablet (100 mg total) by mouth at bedtime. 06/06/19   [place] Crane, MD  tiZANidine (ZANAFLEX) 2 MG tablet Take  1-2 tablets (2-4 mg total) by mouth 2 (two) times daily as needed for muscle spasms. 04/17/21   Jaynee Eagles, PA-C  traZODone (DESYREL) 100 MG tablet TAKE (1) TABLET BY MOUTH AT BEDTIME. 11/20/20   Noreene Larsson, NP  triamcinolone cream (KENALOG) 0.1 % Apply 1 application topically 2 (two) times daily as needed. 09/01/20   Scot Jun, FNP  budesonide-formoterol (SYMBICORT) 160-4.5 MCG/ACT inhaler Inhale 2 puffs into the lungs 2 (two) times daily.  09/25/18  [provider]  montelukast (SINGULAIR) 10 MG tablet Take 1 tablet (10 mg total) by mouth at bedtime. 07/26/17 09/25/18  ComerOkey Regal, MD      Allergies    Abilify [aripiprazole] and Codeine    Review of Systems   Review of Systems  Constitutional:  Negative for fever.  Musculoskeletal:  Positive for arthralgias and joint swelling. Negative for myalgias.  Neurological:  Negative for weakness and numbness.  All other systems reviewed and are negative.  Physical Exam Updated Vital Signs BP 127/84 (BP Location: Right Arm)    Pulse 76    Temp 97.8 F (36.6 C) (Oral)    Resp 20    Ht 5' 4"  (1.626 m)    Wt 108.9 kg    LMP 09/30/2014 (Approximate)    SpO2 97%    BMI 41.20 kg/m  Physical Exam Constitutional:      Appearance: She is well-developed.  HENT:     Head: Atraumatic.  Cardiovascular:     Comments: Pulses equal bilaterally Musculoskeletal:        General: Tenderness present.     Cervical back: Normal range of motion.     Right knee: Bony tenderness present. No deformity, effusion or erythema. No LCL laxity or MCL laxity. Abnormal meniscus. Normal patellar mobility.     Instability Tests: Anterior drawer test negative. Posterior drawer test negative.     Comments: Tender to palpation along medial and lateral meniscus of right knee, there is no palpable deformity.  Skin:    General: Skin is warm and dry.  Neurological:     Mental Status: She is alert.     Sensory: No sensory deficit.     Motor: No weakness.      Deep Tendon Reflexes: Reflexes normal.    ED Results / Procedures / Treatments   Labs (all labs ordered are listed, but only abnormal results are displayed) Labs Reviewed - No data to display  EKG None  Radiology No results found.  Procedures Procedures    Medications Ordered in ED Medications  lidocaine (LIDODERM) 5 % 1 patch (1 patch Transdermal Patch Applied 05/07/21 1722)    ED Course/ Medical Decision Making/ A&P  Medical Decision Making Risk Prescription drug management.   Patient with persistent acute pain in her right knee under the care of Dr. Luna Glasgow.  Mechanism of injury and symptoms suggest probable meniscus injury.  She is awaiting MRI of this joint, in the interim she has persistent pain, she presents in a very worn Ace wrap which is providing her no support of her knee.  She was placed in a knee splint, crutches were offered which she was comfortable using.  She was also given a Lidoderm patch for pain relief, prescription for the same.  Discussed other home treatment including elevation, ice.  Plan follow-up with Dr. Luna Glasgow.  There is no indication for repeat imaging at this time, her prior knee films were reviewed.        Final Clinical Impression(s) / ED Diagnoses Final diagnoses:  Acute pain of right knee    Rx / DC Orders ED Discharge Orders          Ordered    lidocaine (LIDODERM) 5 %  Every 24 hours        05/07/21 1645              Evalee Jefferson, PA-C 05/08/21 0102

## 2021-05-12 ENCOUNTER — Telehealth: Payer: Self-pay | Admitting: Orthopaedic Surgery

## 2021-05-12 NOTE — Telephone Encounter (Signed)
Per voice mail received this afternoon - worker's comp contact person, Tresa Moore, ph [phone], requests information regarding MRI ordered for this patient; states  made aware this patient is being treated here, and is asking for medical record information about the MRI to be faxed to her at fax [phone]* (We have had no prior information relayed by patient). ?*I returned call, reached voice mail, requested that a fax request may be sent to our office for review/response. ?

## 2021-05-18 DIAGNOSIS — R350 Frequency of micturition: Secondary | ICD-10-CM | POA: Diagnosis not present

## 2021-05-19 ENCOUNTER — Telehealth: Payer: Self-pay | Admitting: Radiology

## 2021-05-19 NOTE — Telephone Encounter (Signed)
Patient is asking for an addendum to the last office note.  It states she fell at home.  She did not.  This is an [employer] and work comp is covering it.  Can you please make that addendum so I can send it to the patient and to her WC?  MRI to be scheduled by [facility], then patient will follow up here with you to review it.  Thanks.  ?

## 2021-05-20 ENCOUNTER — Ambulatory Visit (HOSPITAL_COMMUNITY): Payer: Medicaid Other

## 2021-05-26 ENCOUNTER — Ambulatory Visit: Payer: Worker's Compensation | Admitting: Orthopaedic Surgery

## 2021-06-02 ENCOUNTER — Encounter: Payer: Self-pay | Admitting: Orthopaedic Surgery

## 2021-06-02 ENCOUNTER — Ambulatory Visit (INDEPENDENT_AMBULATORY_CARE_PROVIDER_SITE_OTHER): Payer: Worker's Compensation | Admitting: Orthopaedic Surgery

## 2021-06-02 DIAGNOSIS — R32 Unspecified urinary incontinence: Secondary | ICD-10-CM | POA: Diagnosis not present

## 2021-06-02 DIAGNOSIS — N393 Stress incontinence (female) (male): Secondary | ICD-10-CM | POA: Diagnosis not present

## 2021-06-02 DIAGNOSIS — S83411A Sprain of medial collateral ligament of right knee, initial encounter: Secondary | ICD-10-CM | POA: Diagnosis not present

## 2021-06-02 NOTE — Progress Notes (Signed)
My knee still hurts. ? ?She has been using a knee immobilizer on the right knee.  The injection did not help. ? ?She had MRI of the right knee showing: ?Mild proximal medial collateral ligament sprain and mild to moderate tricompartmental cartilage degenerative changes. ? ?I have independently reviewed the MRI.   ? ?I have gone over the report with her.  I will give knee sleeve. ? ?Right knee has medial joint line pain and pain over the MCL but it is stable.  ROM is 0 to 110 and much less effusion today.  She has crepitus.  NV intact.  No distal edema is present. ? ?Encounter Diagnosis  ?Name Primary?  ? Sprain of medial collateral ligament of right knee, initial encounter Yes  ? ?A knee sleeve given and instructions given. ? ?Return in two weeks. ? ?Stay out of work. ? ?Call if any problem. ? ?Precautions discussed. ? ?Electronically Signed ?Sanjuana Kava, MD ?4/4/202310:44 AM ? ?

## 2021-06-02 NOTE — Patient Instructions (Signed)
OUT OF WORK ?

## 2021-06-16 ENCOUNTER — Encounter: Payer: Self-pay | Admitting: Orthopaedic Surgery

## 2021-06-16 ENCOUNTER — Ambulatory Visit (INDEPENDENT_AMBULATORY_CARE_PROVIDER_SITE_OTHER): Payer: Worker's Compensation | Admitting: Orthopaedic Surgery

## 2021-06-16 DIAGNOSIS — S83411A Sprain of medial collateral ligament of right knee, initial encounter: Secondary | ICD-10-CM | POA: Diagnosis not present

## 2021-06-16 DIAGNOSIS — S83411D Sprain of medial collateral ligament of right knee, subsequent encounter: Secondary | ICD-10-CM

## 2021-06-16 NOTE — Addendum Note (Signed)
Addended by: Brand Males E on: 06/16/2021 03:40 PM ? ? Modules accepted: Orders ? ?

## 2021-06-16 NOTE — Progress Notes (Signed)
My knee is still hurting. ? ?She has been using the knee sleeve and still has pain of the right knee.  She has some pain laterally now as well.  MRI showed strain of medial collateral ligament.  She has no new trauma. ? ?I am seeing her related to [employer] Injury. ? ?Right knee is painful, ROM is 0 to 110, stable, crepitus, very slight effusion, tender over MCL.  NV intact. ? ?Her brace has a tear in it and we will replace it. ? ?Encounter Diagnosis  ?Name Primary?  ? Sprain of medial collateral ligament of right knee, subsequent encounter Yes  ? ?I will have her go to PT for exercises and treatment. ? ?Stay out of work. ? ?Return in two weeks. ? ?Call if any problem. ? ?Precautions discussed. ? ?Electronically Signed ?Sanjuana Kava, MD ?4/18/20231:52 PM ? ?

## 2021-06-16 NOTE — Patient Instructions (Signed)
Out of work ?

## 2021-06-23 ENCOUNTER — Telehealth: Payer: Self-pay

## 2021-06-23 NOTE — Telephone Encounter (Signed)
Patient called stating that Dr. Luna Glasgow was sending her to physical therapy and she wanted to let us know that  ?W/C is sending her to [facility] on [address] ?

## 2021-06-24 ENCOUNTER — Ambulatory Visit: Payer: Medicaid Other | Admitting: Physician Assistant

## 2021-06-24 ENCOUNTER — Telehealth: Payer: Self-pay | Admitting: Radiology

## 2021-06-24 VITALS — BP 119/75 | HR 91 | Ht 64.0 in | Wt 240.0 lb

## 2021-06-24 DIAGNOSIS — N3941 Urge incontinence: Secondary | ICD-10-CM

## 2021-06-24 DIAGNOSIS — N3281 Overactive bladder: Secondary | ICD-10-CM | POA: Diagnosis not present

## 2021-06-24 DIAGNOSIS — R3915 Urgency of urination: Secondary | ICD-10-CM

## 2021-06-24 LAB — URINALYSIS, ROUTINE W REFLEX MICROSCOPIC
Bilirubin, UA: NEGATIVE
Glucose, UA: NEGATIVE
Ketones, UA: NEGATIVE
Leukocytes,UA: NEGATIVE
Nitrite, UA: NEGATIVE
Protein,UA: NEGATIVE
RBC, UA: NEGATIVE
Specific Gravity, UA: 1.02 (ref 1.005–1.030)
Urobilinogen, Ur: 1 mg/dL (ref 0.2–1.0)
pH, UA: 6 (ref 5.0–7.5)

## 2021-06-24 LAB — BLADDER SCAN AMB NON-IMAGING: Scan Result: 50

## 2021-06-24 MED ORDER — SOLIFENACIN SUCCINATE 10 MG PO TABS: 10.0000 mg | ORAL_TABLET | Freq: Every day | ORAL | 2 refills | Status: DC

## 2021-06-24 NOTE — Progress Notes (Signed)
? ?06/24/2021 ?3:40 PM  ? ?Chelsey Henson ?1970/10/31 ?[id number] ? ? ?Assessment: ? ?1. Urinary frequency ?- Urinalysis, Routine w reflex microscopic ?- BLADDER SCAN AMB NON-IMAGING ? ?2. OAB (overactive bladder) ? ?3. Urgency of urination ? ? ?Plan: ?We will stop the oxybutynin at this time and begin a new prescription of Vesicare.  Patient will sign a medical records from [facility].  Follow-up in 6 to 8 weeks for UA, PVR and symptom check. ? ?Chief Complaint: Urinary frequency ? ?Referring provider: ?Chelsey Squibb, MD ?[address] ?Chelsey Henson ?[place],  [place] [address] ? ? ?History of Present Illness: ? ?Chelsey Henson is a 51 y.o. year old female who is seen in consultation from Chelsey Squibb, MD for evaluation of hesitancy and urgency.  Symptoms have been ongoing for approximately 2 weeks and her urgency with incontinence only occurs with the first void of the morning.  Otherwise, her complaint is an increase in urinary frequency and nocturia.  No burning, dysuria, gross hematuria.  She has had no change in her chronic medications in the past month and no new medications.  She is not sexually active.  History significant for total abdominal hysterectomy, currently on p.o. HRT.  She has been treated at [facility]rology for overactive bladder in the past.  Our office is closer for her prompting her coming to [place].  Patient denies history of stone disease and has had only one UTI in the past several years.  Referral information reviewed and indicates the patient had a urine dip on 05/18/2021 and she was treated with doxycycline.  A urine culture shows E. coli, unknown number of colonies and no sensitivities available for review.  Most recent hemoglobin A1c 5.9. Medical records reviewed during [place]. ?PVR = 50 mL ?UA clear today ? ? ?Past Medical History:  ?Past Medical History:  ?Diagnosis Date  ? Anemia   ? Anxiety   ? Asthma   ? COPD (chronic obstructive pulmonary disease) ([place]) 04/05/2016  ? Phreesia 05/28/2020  ?  Depression   ? Depression   ? Phreesia 05/28/2020  ? Genital herpes   ? GERD (gastroesophageal reflux disease)   ? HIV (human immunodeficiency virus infection) ([place]) 11/24/2017  ? HIV infection ([place]) 2008  ? Hypertension   ? Sleep apnea   ? Substance abuse ([place])   ? cocaine- last use was Oct 2009  ? Vaginal Pap smear, abnormal   ? ? ?Past Surgical History:  ?Past Surgical History:  ?Procedure Laterality Date  ? ABDOMINAL HYSTERECTOMY    ? BENIGN MASS; total hysterectomy  ? CESAREAN SECTION    ? ESOPHAGOGASTRODUODENOSCOPY N/A 06/30/2017  ? Procedure: ESOPHAGOGASTRODUODENOSCOPY (EGD);  Surgeon: Rogene Houston, MD;  Location: [facility];  Service: Endoscopy;  Laterality: N/A;  2:00  ? HERNIA REPAIR  2017  ? ? ?Allergies:  ?Allergies  ?Allergen Reactions  ? Abilify [Aripiprazole] Other (See Comments)  ?  Tardive dyskinesia  ? Codeine Itching  ? ? ?Family History:  ?Family History  ?Problem Relation Age of Onset  ? Depression Mother   ? Hyperlipidemia Mother   ? Learning disabilities Mother   ? Alcohol abuse Mother 75  ? Alcohol abuse Father   ? Arthritis Father   ? Cancer Father   ? Heart disease Father   ? Hypertension Father   ? Learning disabilities Father   ? Alcohol abuse Sister   ?     RECOVERED  ? Alcohol abuse Brother   ? Alcohol abuse Brother   ?  Alcohol abuse Brother   ? ? ?Social History:  ?Social History  ? ?Tobacco Use  ? Smoking status: Never  ? Smokeless tobacco: Never  ?Vaping Use  ? Vaping Use: Never used  ?Substance Use Topics  ? Alcohol use: Yes  ?  Alcohol/week: 1.0 standard drink  ?  Types: 1 Cans of beer per week  ?  Comment: occ.  ? Drug use: No  ?  Comment: none in 7 years  ? ? ?Review of symptoms:  ?Constitutional:  Negative for unexplained weight loss, night sweats, fever, chills ?ENT:  Negative for nose bleeds, sinus pain, painful swallowing ?CV:  Negative for chest pain, shortness of breath, exercise intolerance, palpitations, loss of consciousness ?Resp:  Negative for cough, wheezing,  shortness of breath ?GI:  Negative for nausea, vomiting, diarrhea, bloody stools ?GU:  Positives noted in HPI; otherwise negative for gross hematuria ?Neuro:  Negative for seizures, poor balance, limb weakness, slurred speech ?Psych:  Negative for lack of energy, depression, anxiety ?Endocrine:  Negative for polydipsia, polyuria, symptoms of hypoglycemia (dizziness, hunger, sweating) ?Hematologic:  Negative for anemia, purpura, petechia, prolonged or excessive bleeding, use of anticoagulants  ? ?Physical Exam: ?BP 119/75   Pulse 91   Ht 5' 4"  (1.626 m)   Wt 240 lb (108.9 kg)   LMP 09/30/2014 (Approximate)   BMI 41.20 kg/m?   ?Constitutional:  Alert and oriented, No acute distress. ?HEENT: NCAT, moist mucus membranes.  Trachea midline, no masses. ?Cardiovascular: Regular rate and rhythm without murmur, rub, or gallops ?No clubbing, cyanosis, or edema. ?Respiratory: Normal respiratory effort, clear to auscultation bilaterally ?GI: Abdomen is soft, nontender, nondistended, no abdominal masses ?BACK:  Non-tender to palpation.  No CVAT ?Skin: No obvious rashes, warm, dry, intact ?Neurologic: Alert and oriented, Cranial nerves grossly intact, no focal deficits, moving all 4 extremities. Left knee in a brace. ?Psychiatric: Appropriate. Normal mood and affect. ? ?Laboratory Data: ?No results found for this or any previous visit (from the past 24 hour(s)). ? ?Lab Results  ?Component Value Date  ? WBC 6.3 02/17/2021  ? HGB 13.4 02/17/2021  ? HCT 38.7 02/17/2021  ? MCV 91.1 02/17/2021  ? PLT 185 02/17/2021  ? ? ?Lab Results  ?Component Value Date  ? CREATININE 0.68 02/17/2021  ? ? ?Urinalysis ?   ?Component Value Date/Time  ? COLORURINE YELLOW 11/24/2017 0605  ? APPEARANCEUR CLEAR 11/24/2017 0605  ? LABSPEC 1.020 11/24/2017 0605  ? PHURINE 6.0 11/24/2017 0605  ? GLUCOSEU NEGATIVE 11/24/2017 0605  ? [place] NEGATIVE 11/24/2017 0605  ? [place] NEGATIVE 11/24/2017 0605  ? BILIRUBINUR neg 09/20/2016 1358  ? [place]  NEGATIVE 11/24/2017 0605  ? [place] NEGATIVE 11/24/2017 0605  ? UROBILINOGEN 0.2 09/20/2016 1358  ? NITRITE NEGATIVE 11/24/2017 0605  ? LEUKOCYTESUR NEGATIVE 11/24/2017 0605  ? ? ?No results found for: LABMICR, [place], RBCUA, LABEPIT, MUCUS, BACTERIA ? ?Pertinent Imaging: ?No results found for this or any previous visit. ? ? ?[place], Berneice Heinrich, PA-C ?[facility] ?  ?

## 2021-06-24 NOTE — Progress Notes (Signed)
post void residual =31m ?

## 2021-06-24 NOTE — Telephone Encounter (Signed)
Patient called, asked for [place] to send medical records to [place].  She wants to transfer care there.  I told her to stop by and sign a release, she will do so tomorrow.  ?

## 2021-06-30 ENCOUNTER — Ambulatory Visit: Payer: Worker's Compensation | Admitting: Orthopaedic Surgery

## 2021-07-02 ENCOUNTER — Telehealth: Payer: Self-pay | Admitting: Orthopaedic Surgery

## 2021-07-02 NOTE — Telephone Encounter (Signed)
Voice message received from patient,4:30pm, requests for Chelsey Henson to call her at earliest convenience. ?

## 2021-07-03 ENCOUNTER — Ambulatory Visit (HOSPITAL_COMMUNITY): Payer: Worker's Compensation | Admitting: Physical Therapy

## 2021-07-03 DIAGNOSIS — R32 Unspecified urinary incontinence: Secondary | ICD-10-CM | POA: Diagnosis not present

## 2021-07-03 DIAGNOSIS — N393 Stress incontinence (female) (male): Secondary | ICD-10-CM | POA: Diagnosis not present

## 2021-07-06 ENCOUNTER — Telehealth: Payer: Self-pay

## 2021-07-06 NOTE — Telephone Encounter (Signed)
Patient states that she is having a horrible issue with dry nouth from the vesicare you prescribed and is asking if there is another rx you can send in for her to try.  Please advise. ?

## 2021-07-06 NOTE — Telephone Encounter (Signed)
Patient wanted to confirm notes sent to [facility].  Notes sent.  ?

## 2021-07-07 ENCOUNTER — Other Ambulatory Visit: Payer: Self-pay | Admitting: Physician Assistant

## 2021-07-07 MED ORDER — TROSPIUM CHLORIDE 20 MG PO TABS: 20.0000 mg | ORAL_TABLET | Freq: Two times a day (BID) | ORAL | 3 refills | Status: DC

## 2021-07-07 NOTE — Progress Notes (Signed)
Pt c/o dry mouth with Vesicare. Will switch to [place]. Rx sent to pharmacy ?

## 2021-07-07 NOTE — Telephone Encounter (Signed)
Patient aware, voiced understanding.

## 2021-07-08 DIAGNOSIS — M25561 Pain in right knee: Secondary | ICD-10-CM | POA: Diagnosis not present

## 2021-07-09 ENCOUNTER — Other Ambulatory Visit: Payer: Self-pay | Admitting: Family Medicine

## 2021-07-22 ENCOUNTER — Ambulatory Visit
Admission: EM | Admit: 2021-07-22 | Discharge: 2021-07-22 | Disposition: A | Discharge status: Home / Self Care | Payer: Medicaid Other | Attending: Family Medicine | Admitting: Family Medicine

## 2021-07-22 ENCOUNTER — Ambulatory Visit: Payer: Self-pay

## 2021-07-22 DIAGNOSIS — J069 Acute upper respiratory infection, unspecified: Secondary | ICD-10-CM | POA: Diagnosis not present

## 2021-07-22 DIAGNOSIS — Z20828 Contact with and (suspected) exposure to other viral communicable diseases: Secondary | ICD-10-CM | POA: Diagnosis not present

## 2021-07-22 NOTE — ED Provider Notes (Signed)
[facility]    CSN: [id number] Arrival date & time: 07/22/21  1131      History   Chief Complaint Chief Complaint  Patient presents with   Fever    Nausea, vomiting, chills,and fever    HPI Almarie Kurdziel is a 51 y.o. female.   Ending today with 3-day history of fever, chills, sweats, fatigue, body aches, decreased appetite, runny nose, mild cough.  Denies chest pain, shortness of breath, abdominal pain, nausea vomiting or diarrhea.  So far not trying anything over-the-counter for symptoms.  No known sick contacts recently.  History of HIV, asthma, COPD on Symbicort and albuterol.  States she is compliant with her regimen for all   Past Medical History:  Diagnosis Date   Anemia    Anxiety    Asthma    COPD (chronic obstructive pulmonary disease) ([place]) 04/05/2016   Phreesia 05/28/2020   Depression    Depression    Phreesia 05/28/2020   Genital herpes    GERD (gastroesophageal reflux disease)    HIV (human immunodeficiency virus infection) ([place]) 11/24/2017   HIV infection ([place]) 2008   Hypertension    Sleep apnea    Substance abuse ([place])    cocaine- last use was Oct 2009   Vaginal Pap smear, abnormal     Patient Active Problem List   Diagnosis Date Noted   Rash 07/23/2020   Post traumatic stress disorder (PTSD) 04/30/2019   Anxiety 04/30/2019   Encounter to establish care 10/27/2018   Transaminitis 05/24/2017   Gastroesophageal reflux disease without esophagitis 04/06/2017   MDD (major depressive disorder), recurrent episode, moderate ([place]) 09/08/2016   Cocaine use disorder, moderate, in sustained remission ([place]) 09/08/2016   OSA (obstructive sleep apnea) 04/15/2016   Asthma 04/15/2016   HIV (human immunodeficiency virus infection) ([place]) 04/05/2016   Essential hypertension 04/05/2016    Past Surgical History:  Procedure Laterality Date   ABDOMINAL HYSTERECTOMY     BENIGN MASS; total hysterectomy   CESAREAN SECTION     ESOPHAGOGASTRODUODENOSCOPY  N/A 06/30/2017   Procedure: ESOPHAGOGASTRODUODENOSCOPY (EGD);  Surgeon: Rogene Houston, MD;  [facility];  Service: Endoscopy;  Laterality: N/A;  2:00   HERNIA REPAIR  2017    OB History     Gravida  6   Para  4   Term  4   Preterm      AB  2   Living  4      SAB  2   IAB      Ectopic      Multiple      Live Births               Home Medications    Prior to Admission medications   Medication Sig Start Date End Date Taking? Authorizing Provider  trospium (SANCTURA) 20 MG tablet Take 1 tablet (20 mg total) by mouth 2 (two) times daily. 07/07/21   Summerlin, Berneice Heinrich, PA-C  acetaminophen (TYLENOL) 325 MG tablet Take 2 tablets (650 mg total) by mouth every 6 (six) hours as needed. Patient not taking: Reported on 06/24/2021 04/17/21   Jaynee Eagles, PA-C  acyclovir (ZOVIRAX) 800 MG tablet TAKE (1) TABLET BY MOUTH ONCE DAILY. 07/18/20   Noreene Larsson, NP  albuterol (PROVENTIL HFA;VENTOLIN HFA) 108 (90 Base) MCG/ACT inhaler Inhale 2 puffs into the lungs every 4 (four) hours as needed for wheezing or shortness of breath. 07/09/17   Noemi Chapel, MD  ALPRAZolam Duanne Moron) 0.5 MG tablet Take  0.5 mg by mouth 3 (three) times daily as needed. 10/20/20   [provider]  atorvastatin (LIPITOR) 40 MG tablet Take 40 mg by mouth daily.    [provider]  busPIRone (BUSPAR) 7.5 MG tablet Take 7.5 mg by mouth 2 (two) times daily as needed.    [provider]  cyclobenzaprine (FLEXERIL) 10 MG tablet Take 1 tablet (10 mg total) by mouth at bedtime as needed for spasm. 01/06/21   Sanjuana Kava, MD  cyclobenzaprine (FLEXERIL) 5 MG tablet Take 1 tablet (5 mg total) by mouth 3 (three) times daily as needed for muscle spasms. Do not drink alcohol or drive while taking this medication. May cause drowsiness 04/30/21   Volney American, PA-C  elvitegravir-cobicistat-emtricitabine-tenofovir (GENVOYA) 150-150-200-10 MG TABS tablet Take 1 tablet by mouth  daily. 03/05/21   Comer, Okey Regal, MD  escitalopram (LEXAPRO) 20 MG tablet TAKE (1) TABLET BY MOUTH ONCE DAILY. 06/12/20   Noreene Larsson, NP  estradiol (ESTRACE) 1 MG tablet Take 1 mg by mouth every evening.     [provider]  famotidine (PEPCID) 40 MG tablet TAKE (1) TABLET BY MOUTH TWICE DAILY. Patient not taking: Reported on 06/24/2021 02/13/20   Harvel Quale, MD  furosemide (LASIX) 20 MG tablet Take 1 tablet (20 mg total) by mouth daily for 5 days. 11/24/20 11/29/20  Vanessa Kick, MD  lidocaine (LIDODERM) 5 % Place 1 patch onto the skin daily. Remove & Discard patch within 12 hours or as directed by MD 05/07/21   Evalee Jefferson, PA-C  Melatonin 1 MG CAPS Take 2 mg by mouth at bedtime.    [provider]  naproxen (NAPROSYN) 500 MG tablet Take 1 tablet (500 mg total) by mouth 2 (two) times daily. 09/01/20   Scot Jun, FNP  naproxen sodium (ALEVE) 220 MG tablet Take 220 mg by mouth. Patient not taking: Reported on 06/24/2021    [provider]  nystatin (MYCOSTATIN/NYSTOP) powder Apply 1 application topically 2 (two) times daily as needed. Patient not taking: Reported on 06/24/2021 09/01/20   Scot Jun, FNP  QUEtiapine (SEROQUEL) 100 MG tablet Take 1 tablet (100 mg total) by mouth at bedtime. 06/06/19   [place] Crane, MD  tiZANidine (ZANAFLEX) 2 MG tablet Take 1-2 tablets (2-4 mg total) by mouth 2 (two) times daily as needed for muscle spasms. 04/17/21   Jaynee Eagles, PA-C  traZODone (DESYREL) 100 MG tablet TAKE (1) TABLET BY MOUTH AT BEDTIME. Patient not taking: Reported on 06/24/2021 11/20/20   Noreene Larsson, NP  triamcinolone cream (KENALOG) 0.1 % Apply 1 application topically 2 (two) times daily as needed. 09/01/20   Scot Jun, FNP  budesonide-formoterol (SYMBICORT) 160-4.5 MCG/ACT inhaler Inhale 2 puffs into the lungs 2 (two) times daily.  09/25/18  [provider]  montelukast (SINGULAIR) 10 MG tablet Take 1 tablet (10 mg total) by  mouth at bedtime. 07/26/17 09/25/18  Comer, Okey Regal, MD    Family History Family History  Problem Relation Age of Onset   Depression Mother    Hyperlipidemia Mother    Learning disabilities Mother    Alcohol abuse Mother 44   Alcohol abuse Father    Arthritis Father    Cancer Father    Heart disease Father    Hypertension Father    Learning disabilities Father    Alcohol abuse Sister        RECOVERED   Alcohol abuse Brother    Alcohol abuse Brother  Alcohol abuse Brother     Social History Social History   Tobacco Use   Smoking status: Never   Smokeless tobacco: Never  Vaping Use   Vaping Use: Never used  Substance Use Topics   Alcohol use: Yes    Alcohol/week: 1.0 standard drink    Types: 1 Cans of beer per week    Comment: occ.   Drug use: No    Comment: none in 7 years     Allergies   Abilify [aripiprazole] and Codeine   Review of Systems Review of Systems Per HPI  Physical Exam Triage Vital Signs ED Triage Vitals  Enc Vitals Group     BP 07/22/21 1210 124/86     Pulse Rate 07/22/21 1210 90     Resp 07/22/21 1210 18     Temp 07/22/21 1210 98.6 F (37 C)     Temp Source 07/22/21 1210 Oral     SpO2 07/22/21 1210 94 %     Weight --      Height --      Head Circumference --      Peak Flow --      Pain Score 07/22/21 1207 5     Pain Loc --      Pain Edu? --      Excl. in [place]? --    No data found.  Updated Vital Signs BP 124/86 (BP Location: Right Arm)   Pulse 90   Temp 98.6 F (37 C) (Oral)   Resp 18   LMP 09/30/2014 (Approximate)   SpO2 94%   Visual Acuity Right Eye Distance:   Left Eye Distance:   Bilateral Distance:    Right Eye Near:   Left Eye Near:    Bilateral Near:     Physical Exam Vitals and nursing note reviewed.  Constitutional:      Appearance: Normal appearance.  HENT:     Head: Atraumatic.     Right Ear: Tympanic membrane and external ear normal.     Left Ear: Tympanic membrane and external ear normal.      Nose: Rhinorrhea present.     Mouth/Throat:     Mouth: Mucous membranes are moist.     Pharynx: Posterior oropharyngeal erythema present.  Eyes:     Extraocular Movements: Extraocular movements intact.     Conjunctiva/sclera: Conjunctivae normal.  Cardiovascular:     Rate and Rhythm: Normal rate and regular rhythm.     Heart sounds: Normal heart sounds.  Pulmonary:     Effort: Pulmonary effort is normal.     Breath sounds: Normal breath sounds. No wheezing or rales.  Musculoskeletal:        General: Normal range of motion.     Cervical back: Normal range of motion and neck supple.  Skin:    General: Skin is warm and dry.  Neurological:     Mental Status: She is alert and oriented to person, place, and time.  Psychiatric:        Mood and Affect: Mood normal.        Thought Content: Thought content normal.     UC Treatments / Results  Labs (all labs ordered are listed, but only abnormal results are displayed) Labs Reviewed  COVID-19, FLU A+B NAA    EKG   Radiology No results found.  Procedures Procedures (including critical care time)  Medications Ordered in UC Medications - No data to display  Initial Impression / Assessment and Plan / UC Course  I have reviewed the triage vital signs and the nursing notes.  Pertinent labs & imaging results that were available during my care of the patient were reviewed by me and considered in my medical decision making (see chart for details).     COVID and flu test pending, discussed supportive over-the-counter medications, home care and continue breathing regimen.  Return for worsening symptoms.  Final Clinical Impressions(s) / UC Diagnoses   Final diagnoses:  Viral URI with cough   Discharge Instructions   None    ED Prescriptions   None    PDMP not reviewed this encounter.   Volney American, [place] 07/22/21 1452

## 2021-07-22 NOTE — ED Triage Notes (Signed)
Pt states that last Sunday she started having chills, sweats, fatigue, muscle cramps and no appetite

## 2021-07-23 LAB — COVID-19, FLU A+B NAA
Influenza A, NAA: NOT DETECTED
Influenza B, NAA: NOT DETECTED
SARS-CoV-2, NAA: NOT DETECTED

## 2021-07-28 ENCOUNTER — Encounter: Payer: Self-pay | Admitting: General Surgery

## 2021-07-28 ENCOUNTER — Ambulatory Visit: Payer: Medicaid Other | Admitting: General Surgery

## 2021-07-28 VITALS — BP 132/84 | HR 75 | Temp 98.1°F | Resp 18 | Ht 64.0 in | Wt 253.0 lb

## 2021-07-28 DIAGNOSIS — D171 Benign lipomatous neoplasm of skin and subcutaneous tissue of trunk: Secondary | ICD-10-CM

## 2021-07-28 NOTE — Progress Notes (Signed)
Chelsey Henson; [id number]; May 30, 1970   HPI Patient is a 51 year old [place] female who was referred to my care by Dr. Allyn Kenner for evaluation and treatment of a mass in her left buttock.  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  No recent growth has been noted.  She states that it is sometimes irritating and causes discomfort when she is sitting on her buttocks.  She has a history of COPD which has been well controlled.  She also has a history of HIV but she states that her tests have been negative for many years. Past Medical History:  Diagnosis Date   Anemia    Anxiety    Asthma    COPD (chronic obstructive pulmonary disease) ([place]) 04/05/2016   Phreesia 05/28/2020   Depression    Depression    Phreesia 05/28/2020   Genital herpes    GERD (gastroesophageal reflux disease)    HIV (human immunodeficiency virus infection) ([place]) 11/24/2017   HIV infection ([place]) 2008   Hypertension    Sleep apnea    Substance abuse ([place])    cocaine- last use was Oct 2009   Vaginal Pap smear, abnormal     Past Surgical History:  Procedure Laterality Date   ABDOMINAL HYSTERECTOMY     BENIGN MASS; total hysterectomy   CESAREAN SECTION     ESOPHAGOGASTRODUODENOSCOPY N/A 06/30/2017   Procedure: ESOPHAGOGASTRODUODENOSCOPY (EGD);  Surgeon: Rogene Houston, MD;  Location[facility];  Service: Endoscopy;  Laterality: N/A;  2:00   HERNIA REPAIR  2017    Family History  Problem Relation Age of Onset   Depression Mother    Hyperlipidemia Mother    Learning disabilities Mother    Alcohol abuse Mother 28   Alcohol abuse Father    Arthritis Father    Cancer Father    Heart disease Father    Hypertension Father    Learning disabilities Father    Alcohol abuse Sister        RECOVERED   Alcohol abuse Brother    Alcohol abuse Brother    Alcohol abuse Brother     Current Outpatient Medications on File Prior to Visit  Medication Sig Dispense Refill   acyclovir  (ZOVIRAX) 800 MG tablet TAKE (1) TABLET BY MOUTH ONCE DAILY. 90 tablet 0   albuterol (PROVENTIL HFA;VENTOLIN HFA) 108 (90 Base) MCG/ACT inhaler Inhale 2 puffs into the lungs every 4 (four) hours as needed for wheezing or shortness of breath. 1 Inhaler 3   ALPRAZolam (XANAX) 0.5 MG tablet Take 0.5 mg by mouth 3 (three) times daily as needed.     atorvastatin (LIPITOR) 40 MG tablet Take 40 mg by mouth daily.     busPIRone (BUSPAR) 7.5 MG tablet Take 7.5 mg by mouth 2 (two) times daily as needed.     cyclobenzaprine (FLEXERIL) 10 MG tablet Take 1 tablet (10 mg total) by mouth at bedtime as needed for spasm. 30 tablet 0   cyclobenzaprine (FLEXERIL) 5 MG tablet Take 1 tablet (5 mg total) by mouth 3 (three) times daily as needed for muscle spasms. Do not drink alcohol or drive while taking this medication. May cause drowsiness 15 tablet 0   elvitegravir-cobicistat-emtricitabine-tenofovir (GENVOYA) 150-150-200-10 MG TABS tablet Take 1 tablet by mouth daily. 30 tablet 11   escitalopram (LEXAPRO) 20 MG tablet TAKE (1) TABLET BY MOUTH ONCE DAILY. 90 tablet 0   estradiol (ESTRACE) 1 MG tablet Take 1 mg by mouth  every evening.      furosemide (LASIX) 20 MG tablet Take 1 tablet (20 mg total) by mouth daily for 5 days. 5 tablet 0   lidocaine (LIDODERM) 5 % Place 1 patch onto the skin daily. Remove & Discard patch within 12 hours or as directed by MD 30 patch 0   Melatonin 1 MG CAPS Take 2 mg by mouth at bedtime.     naproxen (NAPROSYN) 500 MG tablet Take 1 tablet (500 mg total) by mouth 2 (two) times daily. 20 tablet 0   naproxen sodium (ALEVE) 220 MG tablet Take 220 mg by mouth.     QUEtiapine (SEROQUEL) 100 MG tablet Take 1 tablet (100 mg total) by mouth at bedtime. 90 tablet 0   tiZANidine (ZANAFLEX) 2 MG tablet Take 1-2 tablets (2-4 mg total) by mouth 2 (two) times daily as needed for muscle spasms. 30 tablet 0   triamcinolone cream (KENALOG) 0.1 % Apply 1 application topically 2 (two) times daily as needed.  80 g 0   trospium (SANCTURA) 20 MG tablet Take 1 tablet (20 mg total) by mouth 2 (two) times daily. 60 tablet 3   [DISCONTINUED] budesonide-formoterol (SYMBICORT) 160-4.5 MCG/ACT inhaler Inhale 2 puffs into the lungs 2 (two) times daily.     [DISCONTINUED] montelukast (SINGULAIR) 10 MG tablet Take 1 tablet (10 mg total) by mouth at bedtime. 30 tablet 3   No current facility-administered medications on file prior to visit.    Allergies  Allergen Reactions   Abilify [Aripiprazole] Other (See Comments)    Tardive dyskinesia   Codeine Itching    Social History   Substance and Sexual Activity  Alcohol Use Yes   Alcohol/week: 1.0 standard drink   Types: 1 Cans of beer per week   Comment: occ.    Social History   Tobacco Use  Smoking Status Never  Smokeless Tobacco Never    Review of Systems  Constitutional: Negative.   HENT: Negative.    Eyes: Negative.   Respiratory: Negative.    Cardiovascular: Negative.   Gastrointestinal: Negative.   Genitourinary: Negative.   Musculoskeletal: Negative.   Skin: Negative.   Neurological: Negative.   Endo/Heme/Allergies: Negative.   Psychiatric/Behavioral: Negative.     Objective   Vitals:   07/28/21 1257  BP: 132/84  Pulse: 75  Resp: 18  Temp: 98.1 F (36.7 C)  SpO2: 96%    Physical Exam Vitals reviewed.  Constitutional:      Appearance: Normal appearance. She is obese. She is not ill-appearing.  HENT:     Head: Normocephalic and atraumatic.  Cardiovascular:     Rate and Rhythm: Normal rate and regular rhythm.     Heart sounds: Normal heart sounds. No murmur heard.   No friction rub. No gallop.  Pulmonary:     Effort: Pulmonary effort is normal. No respiratory distress.     Breath sounds: Normal breath sounds. No stridor. No wheezing, rhonchi or rales.  Skin:    General: Skin is warm and dry.     Comments: Large nontender indistinct 6 x 7 cm subcutaneous mass noted in the left buttock.  Neurological:     Mental  Status: She is alert and oriented to person, place, and time.    Assessment  Mass, left buttock, probable lipoma versus increased adipose tissue density. Plan  Will get ultrasound of left buttock to assess the mass.  Further management is pending those results.  Will excise only if it is well-defined.  This was explained  to the patient who understands and agrees.

## 2021-08-02 ENCOUNTER — Other Ambulatory Visit: Payer: Self-pay

## 2021-08-02 ENCOUNTER — Emergency Department (HOSPITAL_COMMUNITY)
Admission: EM | Admit: 2021-08-02 | Discharge: 2021-08-02 | Disposition: A | Discharge status: Home / Self Care | Payer: Medicaid Other | Attending: Emergency Medicine | Admitting: Emergency Medicine

## 2021-08-02 ENCOUNTER — Encounter (HOSPITAL_COMMUNITY): Payer: Self-pay | Admitting: Emergency Medicine

## 2021-08-02 ENCOUNTER — Emergency Department (HOSPITAL_COMMUNITY): Payer: Medicaid Other

## 2021-08-02 DIAGNOSIS — Z7951 Long term (current) use of inhaled steroids: Secondary | ICD-10-CM | POA: Insufficient documentation

## 2021-08-02 DIAGNOSIS — J45909 Unspecified asthma, uncomplicated: Secondary | ICD-10-CM | POA: Diagnosis not present

## 2021-08-02 DIAGNOSIS — R0689 Other abnormalities of breathing: Secondary | ICD-10-CM | POA: Diagnosis not present

## 2021-08-02 DIAGNOSIS — R0602 Shortness of breath: Secondary | ICD-10-CM | POA: Insufficient documentation

## 2021-08-02 DIAGNOSIS — M25512 Pain in left shoulder: Secondary | ICD-10-CM | POA: Insufficient documentation

## 2021-08-02 DIAGNOSIS — I10 Essential (primary) hypertension: Secondary | ICD-10-CM | POA: Diagnosis not present

## 2021-08-02 DIAGNOSIS — Z21 Asymptomatic human immunodeficiency virus [HIV] infection status: Secondary | ICD-10-CM | POA: Diagnosis not present

## 2021-08-02 DIAGNOSIS — R457 State of emotional shock and stress, unspecified: Secondary | ICD-10-CM | POA: Diagnosis not present

## 2021-08-02 DIAGNOSIS — M79603 Pain in arm, unspecified: Secondary | ICD-10-CM | POA: Diagnosis not present

## 2021-08-02 MED ORDER — DIAZEPAM 5 MG PO TABS
5.0000 mg | ORAL_TABLET | Freq: Once | ORAL | Status: AC
  Administered 2021-08-02: 5 mg via ORAL
  Filled 2021-08-02: qty 1

## 2021-08-02 MED ORDER — CYCLOBENZAPRINE HCL 10 MG PO TABS: 10.0000 mg | ORAL_TABLET | Freq: Two times a day (BID) | ORAL | 0 refills | Status: DC | PRN

## 2021-08-02 NOTE — Discharge Instructions (Addendum)
You have been seen here for shoulder pain. I recommend taking over-the-counter pain medications like ibuprofen and/or Tylenol every 6 as needed.  Please follow dosage and on the back of bottle.  I also recommend applying heat to the area and stretching out the muscles as this will help decrease stiffness and pain.  I have given you information on exercises please follow.  Follow-up with Ortho or PCP for reassessment.  Come back to the emergency department if you develop chest pain, shortness of breath, severe abdominal pain, uncontrolled nausea, vomiting, diarrhea.

## 2021-08-02 NOTE — ED Triage Notes (Signed)
Pt to the ED with [facility] after an anxiety attack with left shoulder pain.

## 2021-08-02 NOTE — ED Provider Notes (Signed)
[facility] EMERGENCY DEPARTMENT Provider Note   CSN: [id number] Arrival date & time: 08/02/21  1135     History  Chief Complaint  Patient presents with   Shoulder Pain    Chelsey Henson is a 51 y.o. female.  HPI  With medical history including asthma, depression, anxiety, HIV presents emerged part with complaints of left shoulder pain.  Patient states pain started suddenly, states that she was cooking breakfast and started feel a muscle cramp in her left scapula, pain then rating down to her left arm, she states that she has significant pain in that arm and feels as if it slightly numb.  She denies any trauma to the area, states that she frequently gets muscle spasms but this feels a little worse than usual.  She denies any paresthesia or weakness here with her extremities, denies any headache change in vision no recent head trauma, not on anticoag's.  She has no other complaints at this time.  She has noticed that she felt slightly short of breath states is because she has pain in her left shoulder, she denies pleuritic chest pain, denies actual chest pain itself, no cardiac history no history of PEs or DVTs currently on hormone therapy.  Home Medications Prior to Admission medications   Medication Sig Start Date End Date Taking? Authorizing Provider  cyclobenzaprine (FLEXERIL) 10 MG tablet Take 1 tablet (10 mg total) by mouth 2 (two) times daily as needed for muscle spasms. 08/02/21  Yes Marcello Fennel, PA-C  acyclovir (ZOVIRAX) 800 MG tablet TAKE (1) TABLET BY MOUTH ONCE DAILY. 07/18/20   Noreene Larsson, NP  albuterol (PROVENTIL HFA;VENTOLIN HFA) 108 (90 Base) MCG/ACT inhaler Inhale 2 puffs into the lungs every 4 (four) hours as needed for wheezing or shortness of breath. 07/09/17   Noemi Chapel, MD  ALPRAZolam Duanne Moron) 0.5 MG tablet Take 0.5 mg by mouth 3 (three) times daily as needed. 10/20/20   [provider]  atorvastatin (LIPITOR) 40 MG tablet Take 40 mg by mouth daily.     [provider]  busPIRone (BUSPAR) 7.5 MG tablet Take 7.5 mg by mouth 2 (two) times daily as needed.    [provider]  elvitegravir-cobicistat-emtricitabine-tenofovir (GENVOYA) 150-150-200-10 MG TABS tablet Take 1 tablet by mouth daily. 03/05/21   Comer, Okey Regal, MD  escitalopram (LEXAPRO) 20 MG tablet TAKE (1) TABLET BY MOUTH ONCE DAILY. 06/12/20   Noreene Larsson, NP  estradiol (ESTRACE) 1 MG tablet Take 1 mg by mouth every evening.     [provider]  furosemide (LASIX) 20 MG tablet Take 1 tablet (20 mg total) by mouth daily for 5 days. 11/24/20 07/28/21  Vanessa Kick, MD  lidocaine (LIDODERM) 5 % Place 1 patch onto the skin daily. Remove & Discard patch within 12 hours or as directed by MD 05/07/21   Evalee Jefferson, PA-C  Melatonin 1 MG CAPS Take 2 mg by mouth at bedtime.    [provider]  naproxen (NAPROSYN) 500 MG tablet Take 1 tablet (500 mg total) by mouth 2 (two) times daily. 09/01/20   Scot Jun, FNP  naproxen sodium (ALEVE) 220 MG tablet Take 220 mg by mouth.    [provider]  QUEtiapine (SEROQUEL) 100 MG tablet Take 1 tablet (100 mg total) by mouth at bedtime. 06/06/19   [place] Crane, MD  tiZANidine (ZANAFLEX) 2 MG tablet Take 1-2 tablets (2-4 mg total) by mouth 2 (two) times daily as needed for muscle spasms. 04/17/21  Jaynee Eagles, PA-C  triamcinolone cream (KENALOG) 0.1 % Apply 1 application topically 2 (two) times daily as needed. 09/01/20   Scot Jun, FNP  trospium (SANCTURA) 20 MG tablet Take 1 tablet (20 mg total) by mouth 2 (two) times daily. 07/07/21   Summerlin, Berneice Heinrich, PA-C  budesonide-formoterol (SYMBICORT) 160-4.5 MCG/ACT inhaler Inhale 2 puffs into the lungs 2 (two) times daily.  09/25/18  [provider]  montelukast (SINGULAIR) 10 MG tablet Take 1 tablet (10 mg total) by mouth at bedtime. 07/26/17 09/25/18  Thayer Headings, MD      Allergies    Abilify [aripiprazole] and Codeine    Review of  Systems   Review of Systems  Constitutional:  Negative for chills and fever.  Respiratory:  Negative for shortness of breath.   Cardiovascular:  Negative for chest pain.  Gastrointestinal:  Negative for abdominal pain.  Musculoskeletal:        Left arm pain  Neurological:  Negative for headaches.   Physical Exam Updated Vital Signs BP 124/86   Pulse 87   Temp 97.9 F (36.6 C) (Oral)   Resp 18   Ht 5' 4"  (1.626 m)   Wt 114.8 kg   LMP 09/30/2014 (Approximate)   SpO2 96%   BMI 43.43 kg/m  Physical Exam Vitals and nursing note reviewed.  Constitutional:      General: She is not in acute distress.    Appearance: She is not ill-appearing.  HENT:     Head: Normocephalic and atraumatic.     Nose: No congestion.     Mouth/Throat:     Mouth: Mucous membranes are moist.     Pharynx: Oropharynx is clear.  Eyes:     Extraocular Movements: Extraocular movements intact.     Conjunctiva/sclera: Conjunctivae normal.     Pupils: Pupils are equal, round, and reactive to light.  Cardiovascular:     Rate and Rhythm: Normal rate and regular rhythm.     Pulses: Normal pulses.     Heart sounds: No murmur heard.   No friction rub. No gallop.  Pulmonary:     Effort: No respiratory distress.     Breath sounds: No wheezing, rhonchi or rales.  Musculoskeletal:     Comments: Spine was palpated was nontender to palpation no step-off or deformities noted.  No deformity of the left arm present, she is full range of motion her fingers wrist and elbow, limited range of motion at the shoulder due to pain, point tenderness along the medial border of the left scapula without deformity present.  2+ radial pulses.  She is moving all other 3 extremities without difficulty.  Skin:    General: Skin is warm and dry.  Neurological:     Mental Status: She is alert.     Comments: No facial asymmetry no difficulty word finding following two-step commands no regular weakness present.  Psychiatric:        Mood  and Affect: Mood normal.    ED Results / Procedures / Treatments   Labs (all labs ordered are listed, but only abnormal results are displayed) Labs Reviewed - No data to display  EKG EKG Interpretation  Date/Time:  Sunday August 02 2021 13:16:27 EDT Ventricular Rate:  86 PR Interval:  164 QRS Duration: 89 QT Interval:  351 QTC Calculation: 420 R Axis:   34 Text Interpretation: Sinus rhythm Low voltage, precordial leads Confirmed by Milton Ferguson [phone]) on 08/02/2021 1:45:46 PM  Radiology DG Shoulder Left  Result  Date: 08/02/2021 CLINICAL DATA:  Shoulder and scapular pain.  No known injury. EXAM: LEFT SHOULDER - 2+ VIEW COMPARISON:  None Available. FINDINGS: There is no evidence of fracture or dislocation. There is no evidence of arthropathy or other focal bone abnormality. Soft tissues are unremarkable. IMPRESSION: Negative. Electronically Signed   By: Dorise Bullion III M.D.   On: 08/02/2021 13:22    Procedures Procedures    Medications Ordered in ED Medications  diazepam (VALIUM) tablet 5 mg (5 mg Oral Given 08/02/21 1317)    ED Course/ Medical Decision Making/ A&P                           Medical Decision Making Amount and/or Complexity of Data Reviewed Radiology: ordered.  Risk Prescription drug management.   This patient presents to the ED for concern of left shoulder pain, this involves an extensive number of treatment options, and is a complaint that carries with it a high risk of complications and morbidity.  The differential diagnosis includes fracture, dislocation, ACS    Additional history obtained:  Additional history obtained from N/A External records from outside source obtained and reviewed including medication list, medical history ED notes   Co morbidities that complicate the patient evaluation  Anxiety  Social Determinants of Health:  N/A    Lab Tests:  I Ordered, and personally interpreted labs.  The pertinent results include:  N/A   Imaging Studies ordered:  I ordered imaging studies including DG of left shoulder I independently visualized and interpreted imaging which showed negative for acute findings I agree with the radiologist interpretation   Cardiac Monitoring:  The patient was maintained on a cardiac monitor.  I personally viewed and interpreted the cardiac monitored which showed an underlying rhythm of: Without signs of ischemia   Medicines ordered and prescription drug management:  I ordered medication including Valium I have reviewed the patients home medicines and have made adjustments as needed  Critical Interventions:  N/A   Reevaluation:  Presents with left shoulder pain, suspicion is likely musculoskeletal, will provide her with Valium, obtain x-ray, as well as EKG and reassess.  Reassessed resting comfortably states she is feeling much better, she is moving her arm more than she was before.  She states she is ready for discharge.  Consultations Obtained:  N/A   Test Considered:  -DVT-deferred as my suspicion for DVT is low at this time, there is no unilateral swelling, no palpable cords, presentation atypical etiology.    Rule out Low suspicion for ACS as presentation atypical, EKG without signs of ischemia.I have low suspicion for septic arthritis as patient denies IV drug use, skin exam was performed no erythematous, edematous, warm joints noted on exam, no new heart murmur heard on exam.  Low suspicion for fracture or dislocation as x-ray does not feel any significant findings. low suspicion for ligament or tendon damage as area was palpated no gross defects noted, they had full range of motion as well as 5/5 strength.  Low suspicion for compartment syndrome as area was palpated it was soft to the touch, neurovascular fully intact.  Low suspicion for PE as she is PERC negative.     Dispostion and problem list  After consideration of the diagnostic results and the  patients response to treatment, I feel that the patent would benefit from discharge.   Shoulder pain-likely muscular strain, will provide with a muscle relaxer, follow-up with Ortho for further evaluation and  strict return precautions.            Final Clinical Impression(s) / ED Diagnoses Final diagnoses:  Acute pain of left shoulder    Rx / DC Orders ED Discharge Orders          Ordered    cyclobenzaprine (FLEXERIL) 10 MG tablet  2 times daily PRN        08/02/21 1348              Aron Baba 08/02/21 1350    Milton Ferguson, MD 08/04/21 1117

## 2021-08-03 DIAGNOSIS — N393 Stress incontinence (female) (male): Secondary | ICD-10-CM | POA: Diagnosis not present

## 2021-08-03 DIAGNOSIS — R32 Unspecified urinary incontinence: Secondary | ICD-10-CM | POA: Diagnosis not present

## 2021-08-04 ENCOUNTER — Ambulatory Visit: Payer: Medicaid Other | Admitting: Physician Assistant

## 2021-08-04 ENCOUNTER — Ambulatory Visit (HOSPITAL_COMMUNITY)
Admission: RE | Admit: 2021-08-04 | Discharge: 2021-08-04 | Disposition: A | Discharge status: Home / Self Care | Payer: Medicaid Other | Source: Ambulatory Visit | Attending: General Surgery | Admitting: General Surgery

## 2021-08-04 ENCOUNTER — Ambulatory Visit: Payer: Medicaid Other | Admitting: Urology

## 2021-08-04 VITALS — BP 125/82 | HR 82 | Ht 64.0 in | Wt 250.0 lb

## 2021-08-04 DIAGNOSIS — D171 Benign lipomatous neoplasm of skin and subcutaneous tissue of trunk: Secondary | ICD-10-CM | POA: Insufficient documentation

## 2021-08-04 DIAGNOSIS — R8271 Bacteriuria: Secondary | ICD-10-CM

## 2021-08-04 DIAGNOSIS — N3281 Overactive bladder: Secondary | ICD-10-CM | POA: Diagnosis not present

## 2021-08-04 DIAGNOSIS — D1724 Benign lipomatous neoplasm of skin and subcutaneous tissue of left leg: Secondary | ICD-10-CM | POA: Diagnosis not present

## 2021-08-04 LAB — BLADDER SCAN AMB NON-IMAGING: Scan Result: 0

## 2021-08-04 LAB — MICROSCOPIC EXAMINATION: Renal Epithel, UA: NONE SEEN /hpf

## 2021-08-04 LAB — URINALYSIS, ROUTINE W REFLEX MICROSCOPIC
Bilirubin, UA: NEGATIVE
Glucose, UA: NEGATIVE
Ketones, UA: NEGATIVE
Leukocytes,UA: NEGATIVE
Nitrite, UA: NEGATIVE
Protein,UA: NEGATIVE
Specific Gravity, UA: 1.015 (ref 1.005–1.030)
Urobilinogen, Ur: 1 mg/dL (ref 0.2–1.0)
pH, UA: 7 (ref 5.0–7.5)

## 2021-08-04 MED ORDER — MIRABEGRON ER 25 MG PO TB24: 25.0000 mg | ORAL_TABLET | Freq: Every day | ORAL | 0 refills | Status: DC

## 2021-08-04 MED ORDER — NITROFURANTOIN MONOHYD MACRO 100 MG PO CAPS: 100.0000 mg | ORAL_CAPSULE | Freq: Two times a day (BID) | ORAL | 0 refills | Status: AC

## 2021-08-04 MED ORDER — GEMTESA 75 MG PO TABS: 75.0000 mg | ORAL_TABLET | Freq: Every day | ORAL | 0 refills | Status: DC

## 2021-08-04 NOTE — Progress Notes (Unsigned)
post void residual =59m

## 2021-08-04 NOTE — Progress Notes (Signed)
Assessment: 1. OAB (overactive bladder) - BLADDER SCAN AMB NON-IMAGING - Urinalysis, Routine w reflex microscopic  2. Bacteriuria - Urine Culture    Plan: Urine sent for culture as patient is symptomatic with bacteriuria.  Initiate Macrobid and change therapy pending culture results.  Discussed Myrbetriq, the patient recall she has tried and failed this in the past.  Gemtesa samples given for her OAB symptoms.  Discussed timed voiding and or working on emptying her bladder completely with every void.  Because her symptoms are ongoing without responding to various OAB medications, will have her follow-up in 4 to 6 weeks for possible cystoscopic exam.  Chief Complaint: No chief complaint on file.   HPI: Chelsey Henson is a 51 y.o. female who presents for continued evaluation of OAB. Pt did not tolerate Vesicare due to dry mouth and was switched to Prentice which caused similar side effects so she discontinued.  She continues to complain of frequency, urgency, burning, intermittent stream, hesitation.  No dysuria, abdominal pain, gross hematuria.  Very little caffeine intake.  She denies constipation. PVR = 0 mL UA = 0-5 WBCs, moderate bacteria, nitrite negative  07/07/21 Chelsey Henson is a 51 y.o. year old female who is seen in consultation from Chelsey Squibb, MD for evaluation of hesitancy and urgency.  Symptoms have been ongoing for approximately 2 weeks and her urgency with incontinence only occurs with the first void of the morning.  Otherwise, her complaint is an increase in urinary frequency and nocturia.  No burning, dysuria, gross hematuria.  She has had no change in her chronic medications in the past month and no new medications.  She is not sexually active.  History significant for total abdominal hysterectomy, currently on p.o. HRT.  She has been treated at [facility] urology for overactive bladder in the past.  Our office is closer for her prompting her coming to [place].  Patient denies  history of stone disease and has had only one UTI in the past several years.  Referral information reviewed and indicates the patient had a urine dip on 05/18/2021 and she was treated with doxycycline.  A urine culture shows E. coli, unknown number of colonies and no sensitivities available for review.  Most recent hemoglobin A1c 5.9. Medical records reviewed during [place]. PVR = 50 mL UA clear today   Portions of the above documentation were copied from a prior visit for review purposes only.  Allergies: Allergies  Allergen Reactions   Abilify [Aripiprazole] Other (See Comments)    Tardive dyskinesia   Codeine Itching    PMH: Past Medical History:  Diagnosis Date   Anemia    Anxiety    Asthma    COPD (chronic obstructive pulmonary disease) ([place]) 04/05/2016   Phreesia 05/28/2020   Depression    Depression    Phreesia 05/28/2020   Genital herpes    GERD (gastroesophageal reflux disease)    HIV (human immunodeficiency virus infection) ([place]) 11/24/2017   HIV infection ([place]) 2008   Hypertension    Sleep apnea    Substance abuse ([place])    cocaine- last use was Oct 2009   Vaginal Pap smear, abnormal     PSH: Past Surgical History:  Procedure Laterality Date   ABDOMINAL HYSTERECTOMY     BENIGN MASS; total hysterectomy   CESAREAN SECTION     ESOPHAGOGASTRODUODENOSCOPY N/A 06/30/2017   Procedure: ESOPHAGOGASTRODUODENOSCOPY (EGD);  Surgeon: Rogene Houston, MD;  Location: [facility] ENDO SUITE;  Service: Endoscopy;  Laterality: N/A;  2:00  HERNIA REPAIR  2017    SH: Social History   Tobacco Use   Smoking status: Never   Smokeless tobacco: Never  Vaping Use   Vaping Use: Never used  Substance Use Topics   Alcohol use: Yes    Alcohol/week: 1.0 standard drink    Types: 1 Cans of beer per week    Comment: occ.   Drug use: No    Comment: none in 7 years    ROS: See HPI  PE: BP 125/82   Pulse 82   Ht 5' 4"  (1.626 m)   Wt 250 lb (113.4 kg)   LMP 09/30/2014 (Approximate)    BMI 42.91 kg/m  GENERAL APPEARANCE:  Well appearing, well developed, well nourished, NAD HEENT:  Atraumatic, normocephalic NECK:  Supple. Trachea midline ABDOMEN:  Soft, non-tender, no masses EXTREMITIES:  Moves all extremities well, without clubbing, cyanosis, or edema NEUROLOGIC:  Alert and oriented x 3, normal gait, CN II-XII grossly intact MENTAL STATUS:  appropriate BACK:  Non-tender to palpation, No CVAT SKIN:  Warm, dry, and intact   Results: Laboratory Data: Lab Results  Component Value Date   WBC 6.3 02/17/2021   HGB 13.4 02/17/2021   HCT 38.7 02/17/2021   MCV 91.1 02/17/2021   PLT 185 02/17/2021    Lab Results  Component Value Date   CREATININE 0.68 02/17/2021    No results found for: HGBA1C  Urinalysis    Component Value Date/Time   COLORURINE YELLOW 11/24/2017 0605   APPEARANCEUR Clear 06/24/2021 1634   LABSPEC 1.020 11/24/2017 0605   PHURINE 6.0 11/24/2017 0605   GLUCOSEU Negative 06/24/2021 1634   HGBUR NEGATIVE 11/24/2017 0605   BILIRUBINUR Negative 06/24/2021 [place] 11/24/2017 0605   PROTEINUR Negative 06/24/2021 1634   PROTEINUR NEGATIVE 11/24/2017 0605   UROBILINOGEN 0.2 09/20/2016 1358   NITRITE Negative 06/24/2021 1634   NITRITE NEGATIVE 11/24/2017 0605   LEUKOCYTESUR Negative 06/24/2021 1634    Lab Results  Component Value Date   LABMICR Comment 06/24/2021    Pertinent Imaging: No results found for this or any previous visit.  No results found for this or any previous visit.  No results found for this or any previous visit.  No results found for this or any previous visit.  No results found for this or any previous visit.  No results found for this or any previous visit.  No results found for this or any previous visit.  No results found for this or any previous visit.  Results for orders placed or performed in visit on 08/04/21 (from the past 24 hour(s))  BLADDER SCAN AMB NON-IMAGING   Collection Time:  08/04/21  2:08 PM  Result Value Ref Range   Scan Result 0

## 2021-08-06 LAB — URINE CULTURE

## 2021-08-10 NOTE — H&P (Signed)
Chelsey Henson; [id number]; 1970-11-01   HPI Patient is a 51 year old [place] female who was referred to my care by Dr. Allyn Kenner for evaluation and treatment of a mass in her left buttock.  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  No recent growth has been noted.  She states that it is sometimes irritating and causes discomfort when she is sitting on her buttocks.  She has a history of COPD which has been well controlled.  She also has a history of HIV but she states that her tests have been negative for many years. Past Medical History:  Diagnosis Date   Anemia    Anxiety    Asthma    COPD (chronic obstructive pulmonary disease) ([place]) 04/05/2016   Phreesia 05/28/2020   Depression    Depression    Phreesia 05/28/2020   Genital herpes    GERD (gastroesophageal reflux disease)    HIV (human immunodeficiency virus infection) ([place]) 11/24/2017   HIV infection ([place]) 2008   Hypertension    Sleep apnea    Substance abuse ([place])    cocaine- last use was Oct 2009   Vaginal Pap smear, abnormal     Past Surgical History:  Procedure Laterality Date   ABDOMINAL HYSTERECTOMY     BENIGN MASS; total hysterectomy   CESAREAN SECTION     ESOPHAGOGASTRODUODENOSCOPY N/A 06/30/2017   Procedure: ESOPHAGOGASTRODUODENOSCOPY (EGD);  Surgeon: Rogene Houston, MD;  Location: [facility];  Service: Endoscopy;  Laterality: N/A;  2:00   HERNIA REPAIR  2017    Family History  Problem Relation Age of Onset   Depression Mother    Hyperlipidemia Mother    Learning disabilities Mother    Alcohol abuse Mother 21   Alcohol abuse Father    Arthritis Father    Cancer Father    Heart disease Father    Hypertension Father    Learning disabilities Father    Alcohol abuse Sister        RECOVERED   Alcohol abuse Brother    Alcohol abuse Brother    Alcohol abuse Brother     Current Outpatient Medications on File Prior to Visit  Medication Sig Dispense Refill   acyclovir  (ZOVIRAX) 800 MG tablet TAKE (1) TABLET BY MOUTH ONCE DAILY. 90 tablet 0   albuterol (PROVENTIL HFA;VENTOLIN HFA) 108 (90 Base) MCG/ACT inhaler Inhale 2 puffs into the lungs every 4 (four) hours as needed for wheezing or shortness of breath. 1 Inhaler 3   ALPRAZolam (XANAX) 0.5 MG tablet Take 0.5 mg by mouth 3 (three) times daily as needed.     atorvastatin (LIPITOR) 40 MG tablet Take 40 mg by mouth daily.     busPIRone (BUSPAR) 7.5 MG tablet Take 7.5 mg by mouth 2 (two) times daily as needed.     cyclobenzaprine (FLEXERIL) 10 MG tablet Take 1 tablet (10 mg total) by mouth at bedtime as needed for spasm. 30 tablet 0   cyclobenzaprine (FLEXERIL) 5 MG tablet Take 1 tablet (5 mg total) by mouth 3 (three) times daily as needed for muscle spasms. Do not drink alcohol or drive while taking this medication. May cause drowsiness 15 tablet 0   elvitegravir-cobicistat-emtricitabine-tenofovir (GENVOYA) 150-150-200-10 MG TABS tablet Take 1 tablet by mouth daily. 30 tablet 11   escitalopram (LEXAPRO) 20 MG tablet TAKE (1) TABLET BY MOUTH ONCE DAILY. 90 tablet 0   estradiol (ESTRACE) 1 MG tablet Take 1 mg by mouth  every evening.      furosemide (LASIX) 20 MG tablet Take 1 tablet (20 mg total) by mouth daily for 5 days. 5 tablet 0   lidocaine (LIDODERM) 5 % Place 1 patch onto the skin daily. Remove & Discard patch within 12 hours or as directed by MD 30 patch 0   Melatonin 1 MG CAPS Take 2 mg by mouth at bedtime.     naproxen (NAPROSYN) 500 MG tablet Take 1 tablet (500 mg total) by mouth 2 (two) times daily. 20 tablet 0   naproxen sodium (ALEVE) 220 MG tablet Take 220 mg by mouth.     QUEtiapine (SEROQUEL) 100 MG tablet Take 1 tablet (100 mg total) by mouth at bedtime. 90 tablet 0   tiZANidine (ZANAFLEX) 2 MG tablet Take 1-2 tablets (2-4 mg total) by mouth 2 (two) times daily as needed for muscle spasms. 30 tablet 0   triamcinolone cream (KENALOG) 0.1 % Apply 1 application topically 2 (two) times daily as needed.  80 g 0   trospium (SANCTURA) 20 MG tablet Take 1 tablet (20 mg total) by mouth 2 (two) times daily. 60 tablet 3   [DISCONTINUED] budesonide-formoterol (SYMBICORT) 160-4.5 MCG/ACT inhaler Inhale 2 puffs into the lungs 2 (two) times daily.     [DISCONTINUED] montelukast (SINGULAIR) 10 MG tablet Take 1 tablet (10 mg total) by mouth at bedtime. 30 tablet 3   No current facility-administered medications on file prior to visit.    Allergies  Allergen Reactions   Abilify [Aripiprazole] Other (See Comments)    Tardive dyskinesia   Codeine Itching    Social History   Substance and Sexual Activity  Alcohol Use Yes   Alcohol/week: 1.0 standard drink   Types: 1 Cans of beer per week   Comment: occ.    Social History   Tobacco Use  Smoking Status Never  Smokeless Tobacco Never    Review of Systems  Constitutional: Negative.   HENT: Negative.    Eyes: Negative.   Respiratory: Negative.    Cardiovascular: Negative.   Gastrointestinal: Negative.   Genitourinary: Negative.   Musculoskeletal: Negative.   Skin: Negative.   Neurological: Negative.   Endo/Heme/Allergies: Negative.   Psychiatric/Behavioral: Negative.     Objective   Vitals:   07/28/21 1257  BP: 132/84  Pulse: 75  Resp: 18  Temp: 98.1 F (36.7 C)  SpO2: 96%    Physical Exam Vitals reviewed.  Constitutional:      Appearance: Normal appearance. She is obese. She is not ill-appearing.  HENT:     Head: Normocephalic and atraumatic.  Cardiovascular:     Rate and Rhythm: Normal rate and regular rhythm.     Heart sounds: Normal heart sounds. No murmur heard.   No friction rub. No gallop.  Pulmonary:     Effort: Pulmonary effort is normal. No respiratory distress.     Breath sounds: Normal breath sounds. No stridor. No wheezing, rhonchi or rales.  Skin:    General: Skin is warm and dry.     Comments: Large nontender indistinct 6 x 7 cm subcutaneous mass noted in the left buttock.  Neurological:     Mental  Status: She is alert and oriented to person, place, and time.    Assessment  Mass, left buttock, probable lipoma versus increased adipose tissue density. Plan  Will get ultrasound of left buttock to assess the mass.  Further management is pending those results.  Will excise only if it is well-defined.  This was explained  to the patient who understands and agrees. Addendum: Ultrasound reveals a 4.7 cm subcutaneous mass in the left buttock.  We will proceed with excision of the mass on 08/14/2021.  The risks and benefits of the procedure including bleeding, infection, and the possibility of recurrence of the mass were fully explained to the patient, who gave informed consent.

## 2021-08-10 NOTE — Patient Instructions (Signed)
Chelsey Henson  08/10/2021     @PREFPERIOPPHARMACY @   Your procedure is scheduled on  08/14/2021.   Report to [facility]   Call this number if you have problems the morning of surgery:  [phone]   Remember:  Do not eat or drink after midnight.      Take these medicines the morning of surgery with A SIP OF WATER                   xanax(if needed), genvoya, lexapro.     Do not wear jewelry, make-up or nail polish.  Do not wear lotions, powders, or perfumes, or deodorant.  Do not shave 48 hours prior to surgery.  Men may shave face and neck.  Do not bring valuables to the hospital.  [facility] is not responsible for any belongings or valuables.  Contacts, dentures or bridgework may not be worn into surgery.  Leave your suitcase in the car.  After surgery it may be brought to your room.  For patients admitted to the hospital, discharge time will be determined by your treatment team.  Patients discharged the day of surgery will not be allowed to drive home and must have someone with them for 24 hours.    Special instructions:   DO NOT smoke tobacco or vape for 24 hours before your procedure.  Please read over the following fact sheets that you were given. Coughing and Deep Breathing, Surgical Site Infection Prevention, Anesthesia Post-op Instructions, and Care and Recovery After Surgery      Lipoma Removal, Care After The following information offers guidance on how to care for yourself after your procedure. Your health care provider may also give you more specific instructions. If you have problems or questions, contact your health care provider. What can I expect after the procedure? After the procedure, it is common to have: Mild pain. Swelling. Bruising. Follow these instructions at home: Bathing  Do not take baths, swim, or use a hot tub until your health care provider approves. Ask your health care provider if you may take showers.  You may only be allowed to take sponge baths. Keep your bandage (dressing) clean and dry until your health care provider says it can be removed. Incision care  Follow instructions from your health care provider about how to take care of your incision. Make sure you: Wash your hands with soap and water for at least 20 seconds before and after you change your dressing. If soap and water are not available, use hand sanitizer. Change your dressing as told by your health care provider. Leave stitches (sutures), skin glue, or adhesive strips in place. These skin closures may need to stay in place for 2 weeks or longer. If adhesive strip edges start to loosen and curl up, you may trim the loose edges. Do not remove adhesive strips completely unless your health care provider tells you to do that. Check your incision area every day for signs of infection. Check for: More redness, swelling, or pain. Fluid or blood. Warmth. Pus or a bad smell. Medicines Take over-the-counter and prescription medicines only as told by your health care provider. If you were prescribed an antibiotic medicine, use it as told by your health care provider. Do not stop using the antibiotic even if you start to feel better. General instructions  If you were given a sedative during the procedure, it can affect you for  several hours. Do not drive or operate machinery until your health care provider says that it is safe. Do not use any products that contain nicotine or tobacco before the procedure. These products include cigarettes, chewing tobacco, and vaping devices, such as e-cigarettes. These can delay healing after surgery. If you need help quitting, ask your health care provider. Return to your normal activities as told by your health care provider. Ask your health care provider what activities are safe for you. Keep all follow-up visits. This is important. Contact a health care provider if: You have more redness, swelling,  or pain around your incision. You have fluid or blood coming from your incision. Your incision feels warm to the touch. You have pus or a bad smell coming from your incision. You have pain that does not get better with medicine. Get help right away if: You have chills or a fever. You have severe pain. Summary After the procedure, it is common to have mild pain, swelling, and bruising. Follow instructions from your health care provider about how to take care of your incision. Contact a health care provider if you have signs of infection such as more redness, swelling, or pain. This information is not intended to replace advice given to you by your health care provider. Make sure you discuss any questions you have with your health care provider. Document Revised: 03/06/2021 Document Reviewed: 03/06/2021 Elsevier Patient Education  [place] After This sheet gives you information about how to care for yourself after your procedure. Your health care provider may also give you more specific instructions. If you have problems or questions, contact your health care provider. What can I expect after the procedure? After the procedure, it is common to have: Tiredness. Forgetfulness about what happened after the procedure. Impaired judgment for important decisions. Nausea or vomiting. Some difficulty with balance. Follow these instructions at home: For the time period you were told by your health care provider:     Rest as needed. Do not participate in activities where you could fall or become injured. Do not drive or use machinery. Do not drink alcohol. Do not take sleeping pills or medicines that cause drowsiness. Do not make important decisions or sign legal documents. Do not take care of children on your own. Eating and drinking Follow the diet that is recommended by your health care provider. Drink enough fluid to keep your urine pale  yellow. If you vomit: Drink water, juice, or soup when you can drink without vomiting. Make sure you have little or no nausea before eating solid foods. General instructions Have a responsible adult stay with you for the time you are told. It is important to have someone help care for you until you are awake and alert. Take over-the-counter and prescription medicines only as told by your health care provider. If you have sleep apnea, surgery and certain medicines can increase your risk for breathing problems. Follow instructions from your health care provider about wearing your sleep device: Anytime you are sleeping, including during daytime naps. While taking prescription pain medicines, sleeping medicines, or medicines that make you drowsy. Avoid smoking. Keep all follow-up visits as told by your health care provider. This is important. Contact a health care provider if: You keep feeling nauseous or you keep vomiting. You feel light-headed. You are still sleepy or having trouble with balance after 24 hours. You develop a rash. You have a fever. You have redness or swelling around the  IV site. Get help right away if: You have trouble breathing. You have new-onset confusion at home. Summary For several hours after your procedure, you may feel tired. You may also be forgetful and have poor judgment. Have a responsible adult stay with you for the time you are told. It is important to have someone help care for you until you are awake and alert. Rest as told. Do not drive or operate machinery. Do not drink alcohol or take sleeping pills. Get help right away if you have trouble breathing, or if you suddenly become confused. This information is not intended to replace advice given to you by your health care provider. Make sure you discuss any questions you have with your health care provider. Document Revised: 01/20/2021 Document Reviewed: 01/18/2019 Elsevier Patient Education  [place]. How to Use Chlorhexidine for Bathing Chlorhexidine gluconate (CHG) is a germ-killing (antiseptic) solution that is used to clean the skin. It can get rid of the bacteria that normally live on the skin and can keep them away for about 24 hours. To clean your skin with CHG, you may be given: A CHG solution to use in the shower or as part of a sponge bath. A prepackaged cloth that contains CHG. Cleaning your skin with CHG may help lower the risk for infection: While you are staying in the intensive care unit of the hospital. If you have a vascular access, such as a central line, to provide short-term or long-term access to your veins. If you have a catheter to drain urine from your bladder. If you are on a ventilator. A ventilator is a machine that helps you breathe by moving air in and out of your lungs. After surgery. What are the risks? Risks of using CHG include: A skin reaction. Hearing loss, if CHG gets in your ears and you have a perforated eardrum. Eye injury, if CHG gets in your eyes and is not rinsed out. The CHG product catching fire. Make sure that you avoid smoking and flames after applying CHG to your skin. Do not use CHG: If you have a chlorhexidine allergy or have previously reacted to chlorhexidine. On babies younger than 5 months of age. How to use CHG solution Use CHG only as told by your health care provider, and follow the instructions on the label. Use the full amount of CHG as directed. Usually, this is one bottle. During a shower Follow these steps when using CHG solution during a shower (unless your health care provider gives you different instructions): Start the shower. Use your normal soap and shampoo to wash your face and hair. Turn off the shower or move out of the shower stream. Pour the CHG onto a clean washcloth. Do not use any type of brush or rough-edged sponge. Starting at your neck, lather your body down to your toes. Make sure you follow these  instructions: If you will be having surgery, pay special attention to the part of your body where you will be having surgery. Scrub this area for at least 1 minute. Do not use CHG on your head or face. If the solution gets into your ears or eyes, rinse them well with water. Avoid your genital area. Avoid any areas of skin that have broken skin, cuts, or scrapes. Scrub your back and under your arms. Make sure to wash skin folds. Let the lather sit on your skin for 1-2 minutes or as long as told by your health care provider. Thoroughly rinse your  entire body in the shower. Make sure that all body creases and crevices are rinsed well. Dry off with a clean towel. Do not put any substances on your body afterward--such as powder, lotion, or perfume--unless you are told to do so by your health care provider. Only use lotions that are recommended by the manufacturer. Put on clean clothes or pajamas. If it is the night before your surgery, sleep in clean sheets.  During a sponge bath Follow these steps when using CHG solution during a sponge bath (unless your health care provider gives you different instructions): Use your normal soap and shampoo to wash your face and hair. Pour the CHG onto a clean washcloth. Starting at your neck, lather your body down to your toes. Make sure you follow these instructions: If you will be having surgery, pay special attention to the part of your body where you will be having surgery. Scrub this area for at least 1 minute. Do not use CHG on your head or face. If the solution gets into your ears or eyes, rinse them well with water. Avoid your genital area. Avoid any areas of skin that have broken skin, cuts, or scrapes. Scrub your back and under your arms. Make sure to wash skin folds. Let the lather sit on your skin for 1-2 minutes or as long as told by your health care provider. Using a different clean, wet washcloth, thoroughly rinse your entire body. Make sure that  all body creases and crevices are rinsed well. Dry off with a clean towel. Do not put any substances on your body afterward--such as powder, lotion, or perfume--unless you are told to do so by your health care provider. Only use lotions that are recommended by the manufacturer. Put on clean clothes or pajamas. If it is the night before your surgery, sleep in clean sheets. How to use CHG prepackaged cloths Only use CHG cloths as told by your health care provider, and follow the instructions on the label. Use the CHG cloth on clean, dry skin. Do not use the CHG cloth on your head or face unless your health care provider tells you to. When washing with the CHG cloth: Avoid your genital area. Avoid any areas of skin that have broken skin, cuts, or scrapes. Before surgery Follow these steps when using a CHG cloth to clean before surgery (unless your health care provider gives you different instructions): Using the CHG cloth, vigorously scrub the part of your body where you will be having surgery. Scrub using a back-and-forth motion for 3 minutes. The area on your body should be completely wet with CHG when you are done scrubbing. Do not rinse. Discard the cloth and let the area air-dry. Do not put any substances on the area afterward, such as powder, lotion, or perfume. Put on clean clothes or pajamas. If it is the night before your surgery, sleep in clean sheets.  For general bathing Follow these steps when using CHG cloths for general bathing (unless your health care provider gives you different instructions). Use a separate CHG cloth for each area of your body. Make sure you wash between any folds of skin and between your fingers and toes. Wash your body in the following order, switching to a new cloth after each step: The front of your neck, shoulders, and chest. Both of your arms, under your arms, and your hands. Your stomach and groin area, avoiding the genitals. Your right leg and  foot. Your left leg and foot. The  back of your neck, your back, and your buttocks. Do not rinse. Discard the cloth and let the area air-dry. Do not put any substances on your body afterward--such as powder, lotion, or perfume--unless you are told to do so by your health care provider. Only use lotions that are recommended by the manufacturer. Put on clean clothes or pajamas. Contact a health care provider if: Your skin gets irritated after scrubbing. You have questions about using your solution or cloth. You swallow any chlorhexidine. Call your local poison control center (1-508 245 1168 in the [place] Get help right away if: Your eyes itch badly, or they become very red or swollen. Your skin itches badly and is red or swollen. Your hearing changes. You have trouble seeing. You have swelling or tingling in your mouth or throat. You have trouble breathing. These symptoms may represent a serious problem that is an emergency. Do not wait to see if the symptoms will go away. Get medical help right away. Call your local emergency services (911 in the [place] Do not drive yourself to the hospital. Summary Chlorhexidine gluconate (CHG) is a germ-killing (antiseptic) solution that is used to clean the skin. Cleaning your skin with CHG may help to lower your risk for infection. You may be given CHG to use for bathing. It may be in a bottle or in a prepackaged cloth to use on your skin. Carefully follow your health care provider's instructions and the instructions on the product label. Do not use CHG if you have a chlorhexidine allergy. Contact your health care provider if your skin gets irritated after scrubbing. This information is not intended to replace advice given to you by your health care provider. Make sure you discuss any questions you have with your health care provider. Document Revised: 04/28/2020 Document Reviewed: 04/28/2020 Elsevier Patient Education  [place].

## 2021-08-11 ENCOUNTER — Encounter (HOSPITAL_COMMUNITY)
Admission: RE | Admit: 2021-08-11 | Discharge: 2021-08-11 | Disposition: A | Discharge status: Home / Self Care | Payer: Medicaid Other | Source: Ambulatory Visit | Attending: General Surgery | Admitting: General Surgery

## 2021-08-11 ENCOUNTER — Encounter (HOSPITAL_COMMUNITY): Payer: Self-pay

## 2021-08-11 VITALS — BP 125/82 | HR 76 | Temp 97.9°F | Resp 18 | Ht 64.0 in | Wt 250.0 lb

## 2021-08-11 DIAGNOSIS — Z01812 Encounter for preprocedural laboratory examination: Secondary | ICD-10-CM | POA: Diagnosis not present

## 2021-08-11 DIAGNOSIS — R739 Hyperglycemia, unspecified: Secondary | ICD-10-CM | POA: Diagnosis not present

## 2021-08-11 LAB — BASIC METABOLIC PANEL
Anion gap: 7 (ref 5–15)
BUN: 21 mg/dL — ABNORMAL HIGH (ref 6–20)
CO2: 28 mmol/L (ref 22–32)
Calcium: 8.6 mg/dL — ABNORMAL LOW (ref 8.9–10.3)
Chloride: 105 mmol/L (ref 98–111)
Creatinine, Ser: 0.64 mg/dL (ref 0.44–1.00)
GFR, Estimated: >60 mL/min (ref >60)
Glucose, Bld: 90 mg/dL (ref 70–99)
Potassium: 4.1 mmol/L (ref 3.5–5.1)
Sodium: 140 mmol/L (ref 135–145)

## 2021-08-12 ENCOUNTER — Other Ambulatory Visit (HOSPITAL_COMMUNITY): Payer: Medicaid Other

## 2021-08-14 ENCOUNTER — Telehealth: Payer: Self-pay | Admitting: *Deleted

## 2021-08-14 ENCOUNTER — Encounter (HOSPITAL_COMMUNITY): Payer: Self-pay | Admitting: General Surgery

## 2021-08-14 ENCOUNTER — Other Ambulatory Visit: Payer: Self-pay

## 2021-08-14 ENCOUNTER — Encounter (HOSPITAL_COMMUNITY)
Admission: RE | Disposition: A | Discharge status: Home / Self Care | Payer: Self-pay | Source: Home / Self Care | Attending: General Surgery

## 2021-08-14 ENCOUNTER — Ambulatory Visit (HOSPITAL_COMMUNITY)
Admission: RE | Admit: 2021-08-14 | Discharge: 2021-08-14 | Disposition: A | Discharge status: Home / Self Care | Payer: Medicaid Other | Attending: General Surgery | Admitting: General Surgery

## 2021-08-14 ENCOUNTER — Ambulatory Visit (HOSPITAL_COMMUNITY): Payer: Medicaid Other | Admitting: Certified Registered Nurse Anesthetist

## 2021-08-14 ENCOUNTER — Ambulatory Visit (HOSPITAL_BASED_OUTPATIENT_CLINIC_OR_DEPARTMENT_OTHER): Payer: Medicaid Other | Admitting: Certified Registered Nurse Anesthetist

## 2021-08-14 DIAGNOSIS — D171 Benign lipomatous neoplasm of skin and subcutaneous tissue of trunk: Secondary | ICD-10-CM

## 2021-08-14 DIAGNOSIS — M7989 Other specified soft tissue disorders: Secondary | ICD-10-CM

## 2021-08-14 DIAGNOSIS — F32A Depression, unspecified: Secondary | ICD-10-CM | POA: Insufficient documentation

## 2021-08-14 DIAGNOSIS — J449 Chronic obstructive pulmonary disease, unspecified: Secondary | ICD-10-CM | POA: Diagnosis not present

## 2021-08-14 DIAGNOSIS — Z21 Asymptomatic human immunodeficiency virus [HIV] infection status: Secondary | ICD-10-CM | POA: Diagnosis not present

## 2021-08-14 DIAGNOSIS — F419 Anxiety disorder, unspecified: Secondary | ICD-10-CM | POA: Insufficient documentation

## 2021-08-14 DIAGNOSIS — Z79899 Other long term (current) drug therapy: Secondary | ICD-10-CM | POA: Diagnosis not present

## 2021-08-14 DIAGNOSIS — G4730 Sleep apnea, unspecified: Secondary | ICD-10-CM | POA: Diagnosis not present

## 2021-08-14 DIAGNOSIS — I10 Essential (primary) hypertension: Secondary | ICD-10-CM | POA: Insufficient documentation

## 2021-08-14 DIAGNOSIS — K219 Gastro-esophageal reflux disease without esophagitis: Secondary | ICD-10-CM | POA: Diagnosis not present

## 2021-08-14 DIAGNOSIS — D1779 Benign lipomatous neoplasm of other sites: Secondary | ICD-10-CM | POA: Insufficient documentation

## 2021-08-14 DIAGNOSIS — R229 Localized swelling, mass and lump, unspecified: Secondary | ICD-10-CM | POA: Diagnosis present

## 2021-08-14 SURGERY — EXCISION LIPOMA
Anesthesia: General | Site: Buttocks | Laterality: Left

## 2021-08-14 MED ORDER — FENTANYL CITRATE PF 50 MCG/ML IJ SOSY: 25.0000 ug | PREFILLED_SYRINGE | INTRAMUSCULAR | Status: DC | PRN

## 2021-08-14 MED ORDER — BUPIVACAINE HCL (PF) 0.5 % IJ SOLN
INTRAMUSCULAR | Status: AC
  Filled 2021-08-14: qty 30

## 2021-08-14 MED ORDER — HYDROCODONE-ACETAMINOPHEN 5-325 MG PO TABS: 1.0000 | ORAL_TABLET | ORAL | 0 refills | Status: DC | PRN

## 2021-08-14 MED ORDER — OXYCODONE HCL 5 MG PO TABS: 5.0000 mg | ORAL_TABLET | Freq: Once | ORAL | Status: DC | PRN

## 2021-08-14 MED ORDER — CEFAZOLIN SODIUM-DEXTROSE 2-4 GM/100ML-% IV SOLN
2.0000 g | INTRAVENOUS | Status: AC
  Administered 2021-08-14: 2 g via INTRAVENOUS
  Filled 2021-08-14: qty 100

## 2021-08-14 MED ORDER — OXYCODONE HCL 5 MG/5ML PO SOLN: 5.0000 mg | Freq: Once | ORAL | Status: DC | PRN

## 2021-08-14 MED ORDER — CHLORHEXIDINE GLUCONATE CLOTH 2 % EX PADS: 6.0000 | MEDICATED_PAD | Freq: Once | CUTANEOUS | Status: DC

## 2021-08-14 MED ORDER — 0.9 % SODIUM CHLORIDE (POUR BTL) OPTIME
TOPICAL | Status: DC | PRN
  Administered 2021-08-14: 1000 mL

## 2021-08-14 MED ORDER — ROCURONIUM BROMIDE 100 MG/10ML IV SOLN
INTRAVENOUS | Status: DC | PRN
  Administered 2021-08-14: 30 mg via INTRAVENOUS

## 2021-08-14 MED ORDER — PROPOFOL 10 MG/ML IV BOLUS
INTRAVENOUS | Status: DC | PRN
  Administered 2021-08-14: 200 mg via INTRAVENOUS

## 2021-08-14 MED ORDER — BUPIVACAINE HCL (PF) 0.5 % IJ SOLN
INTRAMUSCULAR | Status: DC | PRN
  Administered 2021-08-14: 10 mL

## 2021-08-14 MED ORDER — LACTATED RINGERS IV SOLN: INTRAVENOUS | Status: DC | PRN

## 2021-08-14 MED ORDER — POVIDONE-IODINE 10 % EX OINT
TOPICAL_OINTMENT | CUTANEOUS | Status: AC
  Filled 2021-08-14: qty 1

## 2021-08-14 MED ORDER — ONDANSETRON HCL 4 MG/2ML IJ SOLN
INTRAMUSCULAR | Status: DC | PRN
  Administered 2021-08-14: 4 mg via INTRAVENOUS

## 2021-08-14 MED ORDER — FENTANYL CITRATE (PF) 100 MCG/2ML IJ SOLN
INTRAMUSCULAR | Status: DC | PRN
  Administered 2021-08-14: 50 ug via INTRAVENOUS

## 2021-08-14 MED ORDER — SUCCINYLCHOLINE CHLORIDE 20 MG/ML IJ SOLN
INTRAMUSCULAR | Status: DC | PRN
  Administered 2021-08-14: 120 mg via INTRAVENOUS

## 2021-08-14 MED ORDER — ONDANSETRON HCL 4 MG/2ML IJ SOLN: 4.0000 mg | Freq: Once | INTRAMUSCULAR | Status: DC | PRN

## 2021-08-14 MED ORDER — SUGAMMADEX SODIUM 200 MG/2ML IV SOLN
INTRAVENOUS | Status: DC | PRN
  Administered 2021-08-14: 200 mg via INTRAVENOUS

## 2021-08-14 MED ORDER — MIDAZOLAM HCL 5 MG/5ML IJ SOLN
INTRAMUSCULAR | Status: DC | PRN
  Administered 2021-08-14: 2 mg via INTRAVENOUS

## 2021-08-14 MED ORDER — FENTANYL CITRATE (PF) 100 MCG/2ML IJ SOLN
INTRAMUSCULAR | Status: AC
  Filled 2021-08-14: qty 2

## 2021-08-14 MED ORDER — MIDAZOLAM HCL 2 MG/2ML IJ SOLN
INTRAMUSCULAR | Status: AC
  Filled 2021-08-14: qty 2

## 2021-08-14 MED ORDER — DEXAMETHASONE SODIUM PHOSPHATE 4 MG/ML IJ SOLN
INTRAMUSCULAR | Status: DC | PRN
  Administered 2021-08-14: 5 mg via INTRAVENOUS

## 2021-08-14 MED ORDER — KETOROLAC TROMETHAMINE 30 MG/ML IJ SOLN
INTRAMUSCULAR | Status: DC | PRN
  Administered 2021-08-14: 30 mg via INTRAVENOUS

## 2021-08-14 SURGICAL SUPPLY — 34 items
ADH SKN CLS APL DERMABOND .7 (GAUZE/BANDAGES/DRESSINGS) ×1
APL PRP STRL LF DISP 70% ISPRP (MISCELLANEOUS) ×1
BLADE SURG 15 STRL LF DISP TIS (BLADE) IMPLANT
BLADE SURG 15 STRL SS (BLADE) ×2
CHLORAPREP W/TINT 26 (MISCELLANEOUS) ×2 IMPLANT
CLOTH BEACON ORANGE TIMEOUT ST (SAFETY) ×2 IMPLANT
COVER LIGHT HANDLE STERIS (MISCELLANEOUS) ×4 IMPLANT
DERMABOND ADVANCED (GAUZE/BANDAGES/DRESSINGS) ×1
DERMABOND ADVANCED .7 DNX12 (GAUZE/BANDAGES/DRESSINGS) IMPLANT
ELECT REM PT RETURN 9FT ADLT (ELECTROSURGICAL) ×2
ELECTRODE REM PT RTRN 9FT ADLT (ELECTROSURGICAL) ×1 IMPLANT
GAUZE 4X4 16PLY ~~LOC~~+RFID DBL (SPONGE) ×1 IMPLANT
GLOVE BIO SURGEON STRL SZ7.5 (GLOVE) ×1 IMPLANT
GLOVE BIOGEL PI IND STRL 7.0 (GLOVE) ×2 IMPLANT
GLOVE BIOGEL PI IND STRL 7.5 (GLOVE) IMPLANT
GLOVE BIOGEL PI INDICATOR 7.0 (GLOVE) ×2
GLOVE BIOGEL PI INDICATOR 7.5 (GLOVE) ×1
GLOVE SURG SS PI 7.5 STRL IVOR (GLOVE) ×2 IMPLANT
GOWN STRL REUS W/TWL LRG LVL3 (GOWN DISPOSABLE) ×5 IMPLANT
KIT TURNOVER KIT A (KITS) ×2 IMPLANT
MANIFOLD NEPTUNE II (INSTRUMENTS) ×2 IMPLANT
NDL HYPO 25X1 1.5 SAFETY (NEEDLE) ×1 IMPLANT
NEEDLE HYPO 25X1 1.5 SAFETY (NEEDLE) ×2 IMPLANT
NS IRRIG 1000ML POUR BTL (IV SOLUTION) ×2 IMPLANT
PACK MINOR (CUSTOM PROCEDURE TRAY) ×1 IMPLANT
PAD ARMBOARD 7.5X6 YLW CONV (MISCELLANEOUS) ×3 IMPLANT
SET BASIN LINEN APH (SET/KITS/TRAYS/PACK) ×2 IMPLANT
SUT ETHILON 3 0 FSL (SUTURE) IMPLANT
SUT MNCRL AB 4-0 PS2 18 (SUTURE) ×1 IMPLANT
SUT VIC AB 2-0 CT2 27 (SUTURE) ×1 IMPLANT
SUT VIC AB 3-0 SH 27 (SUTURE) ×4
SUT VIC AB 3-0 SH 27X BRD (SUTURE) IMPLANT
SYR CONTROL 10ML LL (SYRINGE) ×2 IMPLANT
TOWEL OR 17X26 4PK STRL BLUE (TOWEL DISPOSABLE) ×2 IMPLANT

## 2021-08-14 NOTE — Telephone Encounter (Signed)
Received call from patient [phone]~ telephone.   Surgical Date: 08/14/2021 Procedure: Excision Lipoma, Buttocks  Patient reports that she is undergoing physical therapy for knee injury under worker's comp. Reports that she is mandated to attend sessions under the claim, or benefits will be terminated.   States that she requires a letter stating when she can resume physical therapy and reason.   Please advise on return date.

## 2021-08-14 NOTE — Op Note (Signed)
Patient:  Chelsey Henson  DOB:  1970/05/27  MRN:  [id number]   Preop Diagnosis: Soft tissue mass, left buttock  Postop Diagnosis: Same, lipoma greater than 5 cm  Procedure: Excision of lipoma, left buttock  Surgeon: Aviva Signs, MD  Anes: General endotracheal  Indications: Patient is a 51 year old [name] female who has an indistinct subcutaneous mass in the left buttock.  Due to increasing pain, the patient now presents for excision of the left buttock mass.  The risks and benefits of the procedure including bleeding, infection, and recurrence of the mass were fully explained to the patient, who gave informed consent.  Procedure note: The patient was placed in the right lateral decubitus position after induction of general endotracheal anesthesia.  The left buttock was prepped and draped using the usual sterile technique with ChloraPrep.  Surgical site confirmation was performed.  A longitudinal incision was made over the mass which was in the upper aspect of the left buttock.  The dissection was taken down to the mass which was somewhat indistinct in nature, but consistent with a lipoma.  Interestingly, multiple subcutaneous sclerotic nodules were found with sebum present.  These were sent to pathology for further examination.  The area of the lipoma was greater than 5 cm.  There was removed with blunt dissection as well as cautery.  It was sent to pathology for further examination.  A bleeding was controlled using Bovie electrocautery.  The subcutaneous layer was closed in layers using both 2-0 and 3-0 Vicryl interrupted sutures.  0.5% Sensorcaine was instilled into the surrounding wound.  The skin was closed using a 4-0 Monocryl subcuticular suture.  Dermabond was applied.  All tape and needle counts were correct at the end of the procedure.  The patient was extubated in the operating room and transferred to PACU in stable condition.  Complications: None  EBL: Minimal  Specimen: Lipoma,  left buttock

## 2021-08-14 NOTE — Interval H&P Note (Signed)
History and Physical Interval Note:  08/14/2021 8:33 AM  Chelsey Henson  has presented today for surgery, with the diagnosis of LIPOMA LEFT BUTTOCKS, 4.7 CM.  The various methods of treatment have been discussed with the patient and family. After consideration of risks, benefits and other options for treatment, the patient has consented to  Procedure(s): EXCISION LIPOMA, LEFT BUTTOCKS (Left) as a surgical intervention.  The patient's history has been reviewed, patient examined, no change in status, stable for surgery.  I have reviewed the patient's chart and labs.  Questions were answered to the patient's satisfaction.     Aviva Signs

## 2021-08-14 NOTE — Anesthesia Preprocedure Evaluation (Signed)
Anesthesia Evaluation  Patient identified by MRN, date of birth, ID band Patient awake    Reviewed: Allergy & Precautions, H&P , NPO status , Patient's Chart, lab work & pertinent test results, reviewed documented beta blocker date and time   Airway Mallampati: II  TM Distance: >3 FB Neck ROM: full    Dental no notable dental hx.    Pulmonary sleep apnea , COPD,    Pulmonary exam normal breath sounds clear to auscultation       Cardiovascular Exercise Tolerance: Good hypertension, negative cardio ROS   Rhythm:regular Rate:Normal     Neuro/Psych PSYCHIATRIC DISORDERS Anxiety Depression negative neurological ROS     GI/Hepatic Neg liver ROS, GERD  Medicated,  Endo/Other  Morbid obesity  Renal/GU negative Renal ROS  negative genitourinary   Musculoskeletal   Abdominal   Peds  Hematology  (+) Blood dyscrasia, anemia , HIV,   Anesthesia Other Findings   Reproductive/Obstetrics negative OB ROS                             Anesthesia Physical Anesthesia Plan  ASA: 3  Anesthesia Plan: General   Post-op Pain Management:    Induction:   PONV Risk Score and Plan: Ondansetron  Airway Management Planned:   Additional Equipment:   Intra-op Plan:   Post-operative Plan:   Informed Consent: I have reviewed the patients History and Physical, chart, labs and discussed the procedure including the risks, benefits and alternatives for the proposed anesthesia with the patient or authorized representative who has indicated his/her understanding and acceptance.     Dental Advisory Given  Plan Discussed with: CRNA  Anesthesia Plan Comments:         Anesthesia Quick Evaluation

## 2021-08-14 NOTE — Transfer of Care (Signed)
Immediate Anesthesia Transfer of Care Note  Patient: Chelsey Henson  Procedure(s) Performed: EXCISION LIPOMA, LEFT BUTTOCKS (Left: Buttocks)  Patient Location: PACU  Anesthesia Type:General  Level of Consciousness: awake, alert , oriented and patient cooperative  Airway & Oxygen Therapy: Patient Spontanous Breathing and Patient connected to nasal cannula oxygen  Post-op Assessment: Report given to RN, Post -op Vital signs reviewed and stable and Patient moving all extremities  Post vital signs: Reviewed and stable  Last Vitals:  Vitals Value Taken Time  BP 147/98 08/14/21 1010  Temp 36.8 C 08/14/21 1009  Pulse 95 08/14/21 1014  Resp 18 08/14/21 1014  SpO2 92 % 08/14/21 1014  Vitals shown include unvalidated device data.  Last Pain:  Vitals:   08/14/21 0756  TempSrc: Oral  PainSc: 2       Patients Stated Pain Goal: 6 (12/87/86 7672)  Complications:  Encounter Notable Events  Notable Event Outcome Phase Comment  Difficult to intubate - expected  Intraprocedure Filed from anesthesia note documentation.

## 2021-08-14 NOTE — Anesthesia Procedure Notes (Signed)
Procedure Name: Intubation Date/Time: 08/14/2021 8:59 AM  Performed by: Louann Sjogren, MDPre-anesthesia Checklist: Patient identified, Emergency Drugs available, Suction available, Patient being monitored and Timeout performed Patient Re-evaluated:Patient Re-evaluated prior to induction Oxygen Delivery Method: Circle system utilized Preoxygenation: Pre-oxygenation with 100% oxygen Induction Type: IV induction Ventilation: Oral airway inserted - appropriate to patient size Laryngoscope Size: Glidescope and 2 Grade View: Grade II Tube type: Oral Tube size: 7.5 mm Number of attempts: 2 Airway Equipment and Method: Stylet Placement Confirmation: ETT inserted through vocal cords under direct vision, positive ETCO2 and breath sounds checked- equal and bilateral Secured at: 23 cm Tube secured with: Tape Dental Injury: Teeth and Oropharynx as per pre-operative assessment  Difficulty Due To: Difficulty was anticipated, Difficult Airway- due to large tongue and Difficult Airway-  due to edematous airway Comments: First look by SRNA with miller 2: Grade 3 view, attempt unsuccessful Oral airway placed, ventilation without issues. Second look by SRNA with glidescope, grade 2a view, successful intubation

## 2021-08-14 NOTE — Anesthesia Postprocedure Evaluation (Signed)
Anesthesia Post Note  Patient: Chelsey Henson  Procedure(s) Performed: EXCISION LIPOMA, LEFT BUTTOCKS (Left: Buttocks)  Patient location during evaluation: Phase II Anesthesia Type: General Level of consciousness: awake Pain management: pain level controlled Vital Signs Assessment: post-procedure vital signs reviewed and stable Respiratory status: spontaneous breathing and respiratory function stable Cardiovascular status: blood pressure returned to baseline and stable Postop Assessment: no headache and no apparent nausea or vomiting Anesthetic complications: yes Comments: Late entry   Encounter Notable Events  Notable Event Outcome Phase Comment  Difficult to intubate - expected  Intraprocedure Filed from anesthesia note documentation.     Last Vitals:  Vitals:   08/14/21 1030 08/14/21 1051  BP: (!) 140/98 (!) 156/86  Pulse: 87 86  Resp: 17 18  Temp:  36.5 C  SpO2: 98% 95%    Last Pain:  Vitals:   08/14/21 1051  TempSrc: Oral  PainSc: [place]

## 2021-08-17 LAB — SURGICAL PATHOLOGY

## 2021-08-17 NOTE — Telephone Encounter (Signed)
Discussed with Dr. Arnoldo Morale.   Reports that patient is cleared to resume PT at any time.   Call placed to patient and patient made aware. States that she has not missed any appointments at this time.   States that if she needs any documentation, she will call back.

## 2021-08-20 ENCOUNTER — Encounter: Payer: Self-pay | Admitting: General Surgery

## 2021-08-20 ENCOUNTER — Ambulatory Visit (INDEPENDENT_AMBULATORY_CARE_PROVIDER_SITE_OTHER): Payer: Medicaid Other | Admitting: General Surgery

## 2021-08-20 VITALS — BP 151/89 | HR 102 | Temp 98.0°F | Resp 20 | Ht 64.0 in | Wt 265.0 lb

## 2021-08-20 DIAGNOSIS — Z09 Encounter for follow-up examination after completed treatment for conditions other than malignant neoplasm: Secondary | ICD-10-CM

## 2021-09-03 DIAGNOSIS — R32 Unspecified urinary incontinence: Secondary | ICD-10-CM | POA: Diagnosis not present

## 2021-09-03 DIAGNOSIS — N393 Stress incontinence (female) (male): Secondary | ICD-10-CM | POA: Diagnosis not present

## 2021-09-07 ENCOUNTER — Encounter: Payer: Self-pay | Admitting: Urology

## 2021-09-07 ENCOUNTER — Ambulatory Visit (INDEPENDENT_AMBULATORY_CARE_PROVIDER_SITE_OTHER): Payer: Medicaid Other | Admitting: Urology

## 2021-09-07 VITALS — BP 128/84 | HR 93

## 2021-09-07 DIAGNOSIS — N3281 Overactive bladder: Secondary | ICD-10-CM

## 2021-09-07 LAB — MICROSCOPIC EXAMINATION
Bacteria, UA: NONE SEEN
Epithelial Cells (non renal): NONE SEEN /hpf (ref 0–10)
RBC, Urine: NONE SEEN /hpf (ref 0–2)
Renal Epithel, UA: NONE SEEN /hpf
WBC, UA: NONE SEEN /hpf (ref 0–5)

## 2021-09-07 LAB — URINALYSIS, ROUTINE W REFLEX MICROSCOPIC
Bilirubin, UA: NEGATIVE
Glucose, UA: NEGATIVE
Leukocytes,UA: NEGATIVE
Nitrite, UA: NEGATIVE
RBC, UA: NEGATIVE
Specific Gravity, UA: 1.025 (ref 1.005–1.030)
Urobilinogen, Ur: 2 mg/dL — ABNORMAL HIGH (ref 0.2–1.0)
pH, UA: 5.5 (ref 5.0–7.5)

## 2021-09-07 MED ORDER — GEMTESA 75 MG PO TABS: 75.0000 mg | ORAL_TABLET | Freq: Every day | ORAL | 11 refills | Status: AC

## 2021-09-07 MED ORDER — CIPROFLOXACIN HCL 500 MG PO TABS
500.0000 mg | ORAL_TABLET | Freq: Once | ORAL | Status: AC
  Administered 2021-09-07: 500 mg via ORAL

## 2021-09-07 NOTE — Progress Notes (Signed)
Assessment: 1. OAB (overactive bladder)     Plan: Continue Gemtesa 75 mg daily.  Samples and rx provided Return to office in 6 weeks  Chief Complaint: Chief Complaint  Patient presents with   Over Active Bladder    HPI: Chelsey Henson is a 51 y.o. female who presents for continued evaluation of overactive bladder symptoms.  She was initially seen in consultation by Roney Marion, PA-C in May 2023 for evaluation of hesitancy and urgency.  She also reported increased urinary frequency and nocturia.  No dysuria or gross hematuria.  She had previously been treated for overactive bladder through [facility].  She was unable to tolerate Vesicare and [place] secondary to dry mouth.  She previously tried [employer] with some benefit but discontinued due to cost of the medication.  She was given samples of Gemtesa in June 2023.   She has a prior history of UTIs.  Urine culture from 3/23 grew E. coli.  Repeat urine culture from 6/23 grew <10K colonies mixed flora.  She was treated with Macrobid in June 2023 due to symptoms.   She returns today for further evaluation with cystoscopy.  She has noted improvement in her OAB symptoms with the [facility] Iv.  No side effects.  She continues to have some frequency, urgency and decreased urge incontinence.  No dysuria or gross hematuria.   Portions of the above documentation were copied from a prior visit for review purposes only.  Allergies: Allergies  Allergen Reactions   Abilify [Aripiprazole] Other (See Comments)    Tardive dyskinesia   Codeine Itching    PMH: Past Medical History:  Diagnosis Date   Anemia    Anxiety    Asthma    COPD (chronic obstructive pulmonary disease) ([place]) 04/05/2016   Phreesia 05/28/2020   Depression    Depression    Phreesia 05/28/2020   Genital herpes    GERD (gastroesophageal reflux disease)    HIV (human immunodeficiency virus infection) ([place]) 11/24/2017   HIV infection ([place]) 2008   Hypertension     Sleep apnea    Substance abuse ([place])    cocaine- last use was Oct 2009   Vaginal Pap smear, abnormal     PSH: Past Surgical History:  Procedure Laterality Date   ABDOMINAL HYSTERECTOMY     BENIGN MASS; total hysterectomy   CESAREAN SECTION     ESOPHAGOGASTRODUODENOSCOPY N/A 06/30/2017   Procedure: ESOPHAGOGASTRODUODENOSCOPY (EGD);  Surgeon: Rogene Houston, MD;  [facility];  Service: Endoscopy;  Laterality: N/A;  2:00   HERNIA REPAIR  2017   LIPOMA EXCISION Left 08/14/2021   Procedure: EXCISION LIPOMA, LEFT BUTTOCKS;  Surgeon: Aviva Signs, MD;  Location: [facility] ORS;  Service: General;  Laterality: Left;    SH: Social History   Tobacco Use   Smoking status: Never   Smokeless tobacco: Never  Vaping Use   Vaping Use: Never used  Substance Use Topics   Alcohol use: Yes    Alcohol/week: 1.0 standard drink of alcohol    Types: 1 Cans of beer per week    Comment: occ.   Drug use: No    Comment: none in 7 years    ROS: Constitutional:  Negative for fever, chills, weight loss CV: Negative for chest pain, previous MI, hypertension Respiratory:  Negative for shortness of breath, wheezing, sleep apnea, frequent cough GI:  Negative for nausea, vomiting, bloody stool, GERD  PE: BP 128/84   Pulse 93   LMP 09/30/2014 (Approximate)  GENERAL APPEARANCE:  Well appearing, well developed, well nourished, NAD HEENT:  Atraumatic, normocephalic, oropharynx clear NECK:  Supple without lymphadenopathy or thyromegaly ABDOMEN:  Soft, non-tender, no masses EXTREMITIES:  Moves all extremities well, without clubbing, cyanosis, or edema NEUROLOGIC:  Alert and oriented x 3, normal gait, CN II-XII grossly intact MENTAL STATUS:  appropriate BACK:  Non-tender to palpation, No CVAT SKIN:  Warm, dry, and intact   Results: U/A:  1+ protein  Procedure:  Flexible Cystourethroscopy  Pre-operative Diagnosis:  Overactive bladder symptoms  Post-operative Diagnosis:  Overactive bladder  symptoms  Anesthesia:  local with lidocaine jelly  Surgical Narrative:  After appropriate informed consent was obtained, the patient was prepped and draped in the usual sterile fashion in the supine position.  The patient was correctly identified and the proper procedure delineated prior to proceeding.  Sterile lidocaine gel was instilled in the urethra. The flexible cystoscope was introduced without difficulty.  Findings:  Urethra: Normal  Bladder:  squamous metaplasia; no other mucosal lesions seen  Ureteral orifices: normal  Additional findings: none  Saline bladder wash for cytology was not performed.    The cystoscope was then removed.  The patient tolerated the procedure well.

## 2021-09-12 DIAGNOSIS — Z1211 Encounter for screening for malignant neoplasm of colon: Secondary | ICD-10-CM | POA: Diagnosis not present

## 2021-09-16 ENCOUNTER — Telehealth: Payer: Self-pay | Admitting: *Deleted

## 2021-09-16 NOTE — Telephone Encounter (Signed)
Received call from patient [phone]~ telephone.  Surgical Date: 08/14/2021 Procedure: Excision Lipoma, Left Buttocks  Patient reports that she has fluid filled area near or at incision of excision. States that she is not able to see area properly as area of concern is on her buttocks.   States that area is soft and fluctuant. Denies warmth to touch or tenderness. States that she is unable to determine if surrounding skin is red due to body habitus.  Denies fever/ chills, N/V, change in appetite.    Advised that patient can come to office for nurse to check and discuss with Dr. Arnoldo Morale. Patient states that she will come by on 09/17/2021.

## 2021-09-17 NOTE — Telephone Encounter (Addendum)
Patient seen in office for evaluation of fluid filled area under incision.   Noted lemon sized swelling under incision of lipoma excision. Area is fluctuant and appears fluid filled. Area is not hard, warm to touch, or have redness extending to surrounding skin.   Attempted aspiration of fluid with 22G sterile needle and 30cc syringe. Minimal fluid expressed, <1cc, appearing to contain dark blood. Pressure dressing applied. Advised to use pressure dressing to assist recollection of fluid/ blood. Given return precautions.  Dr. Arnoldo Morale to be made aware.

## 2021-09-20 LAB — EXTERNAL GENERIC LAB PROCEDURE: COLOGUARD: POSITIVE — AB

## 2021-09-21 ENCOUNTER — Encounter (INDEPENDENT_AMBULATORY_CARE_PROVIDER_SITE_OTHER): Payer: Self-pay | Admitting: *Deleted

## 2021-10-04 DIAGNOSIS — N393 Stress incontinence (female) (male): Secondary | ICD-10-CM | POA: Diagnosis not present

## 2021-10-04 DIAGNOSIS — R32 Unspecified urinary incontinence: Secondary | ICD-10-CM | POA: Diagnosis not present

## 2021-10-21 ENCOUNTER — Ambulatory Visit: Payer: Medicaid Other | Admitting: Urology

## 2021-11-04 ENCOUNTER — Ambulatory Visit (HOSPITAL_COMMUNITY)
Admission: RE | Admit: 2021-11-04 | Discharge: 2021-11-04 | Disposition: A | Discharge status: Home / Self Care | Payer: Medicaid Other | Source: Ambulatory Visit | Attending: Family Medicine | Admitting: Family Medicine

## 2021-11-04 ENCOUNTER — Other Ambulatory Visit (HOSPITAL_COMMUNITY): Payer: Self-pay | Admitting: Family Medicine

## 2021-11-04 DIAGNOSIS — K5900 Constipation, unspecified: Secondary | ICD-10-CM | POA: Insufficient documentation

## 2021-11-04 DIAGNOSIS — M25571 Pain in right ankle and joints of right foot: Secondary | ICD-10-CM | POA: Insufficient documentation

## 2021-11-04 DIAGNOSIS — K219 Gastro-esophageal reflux disease without esophagitis: Secondary | ICD-10-CM | POA: Diagnosis not present

## 2021-11-04 DIAGNOSIS — M25471 Effusion, right ankle: Secondary | ICD-10-CM | POA: Diagnosis not present

## 2021-11-09 ENCOUNTER — Ambulatory Visit: Payer: Self-pay

## 2021-11-09 ENCOUNTER — Ambulatory Visit (INDEPENDENT_AMBULATORY_CARE_PROVIDER_SITE_OTHER): Payer: Worker's Compensation | Admitting: Family Medicine

## 2021-11-09 ENCOUNTER — Encounter: Payer: Self-pay | Admitting: Family Medicine

## 2021-11-09 VITALS — BP 148/78 | Ht 64.0 in | Wt 260.0 lb

## 2021-11-09 DIAGNOSIS — M7631 Iliotibial band syndrome, right leg: Secondary | ICD-10-CM

## 2021-11-09 MED ORDER — METHYLPREDNISOLONE ACETATE 40 MG/ML IJ SUSP
40.0000 mg | Freq: Once | INTRAMUSCULAR | Status: AC
  Administered 2021-11-09: 40 mg via INTRA_ARTICULAR

## 2021-11-09 NOTE — Progress Notes (Signed)
PCP: [name], MD  Subjective:   HPI: Patient is a 51 y.o. female here for right knee IT band injection.  Patient has been seen regularly at [facility] for right leg/knee pain. She injured herself at work earlier this year and has continued to struggle with pain. Dr. Sandi Carne is out on leave so referred here for ultrasound guided distal IT band injection.  Past Medical History:  Diagnosis Date   Anemia    Anxiety    Asthma    COPD (chronic obstructive pulmonary disease) ([place]) 04/05/2016   Phreesia 05/28/2020   Depression    Depression    Phreesia 05/28/2020   Genital herpes    GERD (gastroesophageal reflux disease)    HIV (human immunodeficiency virus infection) ([place]) 11/24/2017   HIV infection ([place]) 2008   Hypertension    Sleep apnea    Substance abuse ([place])    cocaine- last use was Oct 2009   Vaginal Pap smear, abnormal     Current Outpatient Medications on File Prior to Visit  Medication Sig Dispense Refill   acyclovir (ZOVIRAX) 800 MG tablet TAKE (1) TABLET BY MOUTH ONCE DAILY. 90 tablet 0   albuterol (PROVENTIL HFA;VENTOLIN HFA) 108 (90 Base) MCG/ACT inhaler Inhale 2 puffs into the lungs every 4 (four) hours as needed for wheezing or shortness of breath. 1 Inhaler 3   ALPRAZolam (XANAX) 0.5 MG tablet Take 0.5 mg by mouth 3 (three) times daily as needed for anxiety.     atorvastatin (LIPITOR) 40 MG tablet Take 40 mg by mouth daily.     cyclobenzaprine (FLEXERIL) 10 MG tablet Take 1 tablet (10 mg total) by mouth 2 (two) times daily as needed for muscle spasms. 20 tablet 0   elvitegravir-cobicistat-emtricitabine-tenofovir (GENVOYA) 150-150-200-10 MG TABS tablet Take 1 tablet by mouth daily. 30 tablet 11   escitalopram (LEXAPRO) 20 MG tablet TAKE (1) TABLET BY MOUTH ONCE DAILY. 90 tablet 0   estradiol (ESTRACE) 1 MG tablet Take 1 mg by mouth every evening.      HYDROcodone-acetaminophen (NORCO) 5-325 MG tablet Take 1 tablet by mouth every 4 (four) hours as needed  for moderate pain. 30 tablet 0   lidocaine (LIDODERM) 5 % Place 1 patch onto the skin daily. Remove & Discard patch within 12 hours or as directed by MD 30 patch 0   Melatonin 1 MG CAPS Take 2 mg by mouth at bedtime as needed (sleep).     naproxen (NAPROSYN) 500 MG tablet Take 1 tablet (500 mg total) by mouth 2 (two) times daily. (Patient taking differently: Take 500 mg by mouth 2 (two) times daily as needed for moderate pain.) 20 tablet 0   QUEtiapine (SEROQUEL) 100 MG tablet Take 1 tablet (100 mg total) by mouth at bedtime. 90 tablet 0   QUEtiapine (SEROQUEL) 200 MG tablet Take 200 mg by mouth at bedtime.     tiZANidine (ZANAFLEX) 2 MG tablet Take 1-2 tablets (2-4 mg total) by mouth 2 (two) times daily as needed for muscle spasms. 30 tablet 0   traZODone (DESYREL) 100 MG tablet Take 100 mg by mouth at bedtime as needed for sleep.     triamcinolone cream (KENALOG) 0.1 % Apply 1 application topically 2 (two) times daily as needed. 80 g 0   Vibegron (GEMTESA) 75 MG TABS Take 75 mg by mouth daily. 30 tablet 11   [DISCONTINUED] budesonide-formoterol (SYMBICORT) 160-4.5 MCG/ACT inhaler Inhale 2 puffs into the lungs 2 (two) times daily.     [DISCONTINUED]  montelukast (SINGULAIR) 10 MG tablet Take 1 tablet (10 mg total) by mouth at bedtime. 30 tablet 3   No current facility-administered medications on file prior to visit.    Past Surgical History:  Procedure Laterality Date   ABDOMINAL HYSTERECTOMY     BENIGN MASS; total hysterectomy   CESAREAN SECTION     ESOPHAGOGASTRODUODENOSCOPY N/A 06/30/2017   Procedure: ESOPHAGOGASTRODUODENOSCOPY (EGD);  Surgeon: Rogene Houston, MD;  [facility];  Service: Endoscopy;  Laterality: N/A;  2:00   HERNIA REPAIR  2017   LIPOMA EXCISION Left 08/14/2021   Procedure: EXCISION LIPOMA, LEFT BUTTOCKS;  Surgeon: Aviva Signs, MD;  Location: [facility];  Service: General;  Laterality: Left;    Allergies  Allergen Reactions   Abilify [Aripiprazole] Other (See  Comments)    Tardive dyskinesia   Codeine Itching    BP (!) 148/78   Ht '5\' 4"'$  (1.626 m)   Wt 260 lb (117.9 kg)   LMP 09/30/2014 (Approximate)   BMI 44.63 kg/m       No data to display              No data to display              Objective:  Physical Exam:  Gen: NAD, comfortable in exam room  Right knee: No deformity, bruising. Tender distal IT band.   Assessment & Plan:  1. Right leg/knee pain - performed ultrasound guided distal IT band injection today.  She will follow-up at [facility].  She reports having had other injections in past, physical therapy and continues to struggle with pain.    After informed written consent timeout was performed, patient was lying on left side on exam table.  Area overlying right distal IT band bursa prepped with alcohol swabs then utilizing ultrasound guidance, 1:1 lidocaine: depomedrol '40mg'$  injected between distal IT band and lateral femoral condyle.  Patient tolerated procedure well without immediate complications.

## 2021-11-20 ENCOUNTER — Ambulatory Visit
Admission: EM | Admit: 2021-11-20 | Discharge: 2021-11-20 | Disposition: A | Discharge status: Home / Self Care | Payer: Medicaid Other | Attending: Family Medicine | Admitting: Family Medicine

## 2021-11-20 DIAGNOSIS — S90851A Superficial foreign body, right foot, initial encounter: Secondary | ICD-10-CM

## 2021-11-20 DIAGNOSIS — B372 Candidiasis of skin and nail: Secondary | ICD-10-CM

## 2021-11-20 MED ORDER — NYSTATIN 100000 UNIT/GM EX POWD: 1.0000 | Freq: Two times a day (BID) | CUTANEOUS | 0 refills | Status: DC

## 2021-11-20 MED ORDER — NYSTATIN 100000 UNIT/GM EX CREA: TOPICAL_CREAM | CUTANEOUS | 0 refills | Status: DC

## 2021-11-20 NOTE — ED Triage Notes (Signed)
Pt reports she has a pice of glass in the right heel since today.

## 2021-11-20 NOTE — Discharge Instructions (Signed)
Soak the foot in warm Epsom salts several times a day until the area fully resolves.  You may keep it covered with a Band-Aid for protection.  Follow-up if your pain recurs.  Regarding your rash, I have sent over a cream and powder.  Keep the area nice and clean, and apply the cream and then the powder at least twice daily until the rash resolves.

## 2021-11-20 NOTE — ED Provider Notes (Signed)
[facility]    CSN: [id number] Arrival date & time: 11/20/21  1250      History   Chief Complaint Chief Complaint  Patient presents with   Foreign Body    HPI Chelsey Henson is a 51 y.o. female.   Patient presenting today with a glass shard to her right heel.  States that her dog broke through the storm door today and she tried cleaning up the mess, forgetting that she had bare feet.  She denies uncontrolled bleeding, numbness, tingling, decreased range of motion.  She is also having a moist rash with an odor in a fold of her neck.  Has been trying hydrocortisone cream with no relief.  The area is itchy and irritated.    Past Medical History:  Diagnosis Date   Anemia    Anxiety    Asthma    COPD (chronic obstructive pulmonary disease) ([place]) 04/05/2016   Phreesia 05/28/2020   Depression    Depression    Phreesia 05/28/2020   Genital herpes    GERD (gastroesophageal reflux disease)    HIV (human immunodeficiency virus infection) ([place]) 11/24/2017   HIV infection ([place]) 2008   Hypertension    Sleep apnea    Substance abuse ([place])    cocaine- last use was Oct 2009   Vaginal Pap smear, abnormal     Patient Active Problem List   Diagnosis Date Noted   Lipoma of buttock    Rash 07/23/2020   Post traumatic stress disorder (PTSD) 04/30/2019   Anxiety 04/30/2019   Encounter to establish care 10/27/2018   Transaminitis 05/24/2017   Gastroesophageal reflux disease without esophagitis 04/06/2017   MDD (major depressive disorder), recurrent episode, moderate ([place]) 09/08/2016   Cocaine use disorder, moderate, in sustained remission ([place]) 09/08/2016   OSA (obstructive sleep apnea) 04/15/2016   Asthma 04/15/2016   HIV (human immunodeficiency virus infection) ([place]) 04/05/2016   Essential hypertension 04/05/2016    Past Surgical History:  Procedure Laterality Date   ABDOMINAL HYSTERECTOMY     BENIGN MASS; total hysterectomy   CESAREAN SECTION      ESOPHAGOGASTRODUODENOSCOPY N/A 06/30/2017   Procedure: ESOPHAGOGASTRODUODENOSCOPY (EGD);  Surgeon: Rogene Houston, MD;  Location[facility];  Service: Endoscopy;  Laterality: N/A;  2:00   HERNIA REPAIR  2017   LIPOMA EXCISION Left 08/14/2021   Procedure: EXCISION LIPOMA, LEFT BUTTOCKS;  Surgeon: Aviva Signs, MD;  Location: [facility] ORS;  Service: General;  Laterality: Left;    OB History     Gravida  6   Para  4   Term  4   Preterm      AB  2   Living  4      SAB  2   IAB      Ectopic      Multiple      Live Births               Home Medications    Prior to Admission medications   Medication Sig Start Date End Date Taking? Authorizing Provider  nystatin (MYCOSTATIN/NYSTOP) powder Apply 1 Application topically 2 (two) times daily. 11/20/21  Yes Volney American, PA-C  nystatin cream (MYCOSTATIN) Apply to affected area 2 times daily 11/20/21  Yes Volney American, PA-C  acyclovir (ZOVIRAX) 800 MG tablet TAKE (1) TABLET BY MOUTH ONCE DAILY. 07/18/20   Noreene Larsson, NP  albuterol (PROVENTIL HFA;VENTOLIN HFA) 108 (90 Base) MCG/ACT inhaler Inhale 2 puffs into the lungs every 4 (four)  hours as needed for wheezing or shortness of breath. 07/09/17   Noemi Chapel, MD  ALPRAZolam Duanne Moron) 0.5 MG tablet Take 0.5 mg by mouth 3 (three) times daily as needed for anxiety. 10/20/20   [provider]  atorvastatin (LIPITOR) 40 MG tablet Take 40 mg by mouth daily.    [provider]  cyclobenzaprine (FLEXERIL) 10 MG tablet Take 1 tablet (10 mg total) by mouth 2 (two) times daily as needed for muscle spasms. 08/02/21   Marcello Fennel, PA-C  elvitegravir-cobicistat-emtricitabine-tenofovir (GENVOYA) 150-150-200-10 MG TABS tablet Take 1 tablet by mouth daily. 03/05/21   Comer, Okey Regal, MD  escitalopram (LEXAPRO) 20 MG tablet TAKE (1) TABLET BY MOUTH ONCE DAILY. 06/12/20   Noreene Larsson, NP  estradiol (ESTRACE) 1 MG tablet Take 1 mg by mouth every evening.      [provider]  HYDROcodone-acetaminophen (NORCO) 5-325 MG tablet Take 1 tablet by mouth every 4 (four) hours as needed for moderate pain. 08/14/21   Aviva Signs, MD  lidocaine (LIDODERM) 5 % Place 1 patch onto the skin daily. Remove & Discard patch within 12 hours or as directed by MD 05/07/21   Evalee Jefferson, PA-C  Melatonin 1 MG CAPS Take 2 mg by mouth at bedtime as needed (sleep).    [provider]  naproxen (NAPROSYN) 500 MG tablet Take 1 tablet (500 mg total) by mouth 2 (two) times daily. Patient taking differently: Take 500 mg by mouth 2 (two) times daily as needed for moderate pain. 09/01/20   Scot Jun, FNP  QUEtiapine (SEROQUEL) 100 MG tablet Take 1 tablet (100 mg total) by mouth at bedtime. 06/06/19   [place] Crane, MD  QUEtiapine (SEROQUEL) 200 MG tablet Take 200 mg by mouth at bedtime. 08/04/21   [provider]  tiZANidine (ZANAFLEX) 2 MG tablet Take 1-2 tablets (2-4 mg total) by mouth 2 (two) times daily as needed for muscle spasms. 04/17/21   Jaynee Eagles, PA-C  traZODone (DESYREL) 100 MG tablet Take 100 mg by mouth at bedtime as needed for sleep. 08/04/21   [provider]  triamcinolone cream (KENALOG) 0.1 % Apply 1 application topically 2 (two) times daily as needed. 09/01/20   Scot Jun, FNP  Vibegron (GEMTESA) 75 MG TABS Take 75 mg by mouth daily. 09/07/21   [place], Reece Leader., MD  budesonide-formoterol (SYMBICORT) 160-4.5 MCG/ACT inhaler Inhale 2 puffs into the lungs 2 (two) times daily.  09/25/18  [provider]  montelukast (SINGULAIR) 10 MG tablet Take 1 tablet (10 mg total) by mouth at bedtime. 07/26/17 09/25/18  Comer, Okey Regal, MD    Family History Family History  Problem Relation Age of Onset   Depression Mother    Hyperlipidemia Mother    Learning disabilities Mother    Alcohol abuse Mother 72   Alcohol abuse Father    Arthritis Father    Cancer Father    Heart disease Father    Hypertension Father    Learning  disabilities Father    Alcohol abuse Sister        RECOVERED   Alcohol abuse Brother    Alcohol abuse Brother    Alcohol abuse Brother     Social History Social History   Tobacco Use   Smoking status: Never   Smokeless tobacco: Never  Vaping Use   Vaping Use: Never used  Substance Use Topics   Alcohol use: Yes    Alcohol/week: 1.0 standard drink of alcohol    Types:  1 Cans of beer per week    Comment: occ.   Drug use: No    Comment: none in 7 years     Allergies   Abilify [aripiprazole] and Codeine   Review of Systems Review of Systems PER HPI  Physical Exam Triage Vital Signs ED Triage Vitals  Enc Vitals Group     BP 11/20/21 1313 (!) 159/99     Pulse Rate 11/20/21 1313 93     Resp 11/20/21 1313 (!) 28     Temp 11/20/21 1313 98.2 F (36.8 C)     Temp Source 11/20/21 1313 Oral     SpO2 11/20/21 1313 96 %     Weight --      Height --      Head Circumference --      Peak Flow --      Pain Score 11/20/21 1312 0     Pain Loc --      Pain Edu? --      Excl. in [place]? --    No data found.  Updated Vital Signs BP (!) 159/99 (BP Location: Right Arm)   Pulse 93   Temp 98.2 F (36.8 C) (Oral)   Resp (!) 28   LMP 09/30/2014 (Approximate)   SpO2 96%   Visual Acuity Right Eye Distance:   Left Eye Distance:   Bilateral Distance:    Right Eye Near:   Left Eye Near:    Bilateral Near:     Physical Exam Vitals and nursing note reviewed.  Constitutional:      Appearance: Normal appearance. She is not ill-appearing.  HENT:     Head: Atraumatic.  Eyes:     Extraocular Movements: Extraocular movements intact.     Conjunctiva/sclera: Conjunctivae normal.  Cardiovascular:     Rate and Rhythm: Normal rate and regular rhythm.     Heart sounds: Normal heart sounds.  Pulmonary:     Effort: Pulmonary effort is normal.     Breath sounds: Normal breath sounds.  Musculoskeletal:        General: Normal range of motion.     Cervical back: Normal range of motion  and neck supple.  Skin:    General: Skin is warm.     Comments: Erythematous macerated rash to neck fold anteriorly.  Small shard of glass present to center of heel with dried blood surrounding the entry.  This was removed without complication with forceps.  Patient asymptomatic upon standing on the foot post procedure  Neurological:     Mental Status: She is alert and oriented to person, place, and time.     Comments: Right foot neurovascularly intact  Psychiatric:        Mood and Affect: Mood normal.        Thought Content: Thought content normal.        Judgment: Judgment normal.      UC Treatments / Results  Labs (all labs ordered are listed, but only abnormal results are displayed) Labs Reviewed - No data to display  EKG   Radiology No results found.  Procedures Procedures (including critical care time)  Medications Ordered in UC Medications - No data to display  Initial Impression / Assessment and Plan / UC Course  I have reviewed the triage vital signs and the nursing notes.  Pertinent labs & imaging results that were available during my care of the patient were reviewed by me and considered in my medical decision making (see chart for details).  Foreign body from the right heel removed with forceps with full resolution of symptoms and without complication.  Discussed Epsom salt soaks, keeping covered with a bandage until well-healed.  Return precautions reviewed for this.  Regarding her rash to the anterior neck, suspect yeast rash.  Treat with nystatin cream and powder, keep clean and dry.  Return for any worsening symptoms.  Final Clinical Impressions(s) / UC Diagnoses   Final diagnoses:  Foreign body in right foot, initial encounter  Yeast dermatitis     Discharge Instructions      Soak the foot in warm Epsom salts several times a day until the area fully resolves.  You may keep it covered with a Band-Aid for protection.  Follow-up if your pain  recurs.  Regarding your rash, I have sent over a cream and powder.  Keep the area nice and clean, and apply the cream and then the powder at least twice daily until the rash resolves.    ED Prescriptions     Medication Sig Dispense Auth. Provider   nystatin cream (MYCOSTATIN) Apply to affected area 2 times daily 80 g Volney American, PA-C   nystatin (MYCOSTATIN/NYSTOP) powder Apply 1 Application topically 2 (two) times daily. 80 g Volney American, [place]      PDMP not reviewed this encounter.   Volney American, [place] 11/20/21 1336

## 2021-11-24 DIAGNOSIS — R748 Abnormal levels of other serum enzymes: Secondary | ICD-10-CM | POA: Diagnosis not present

## 2021-11-24 DIAGNOSIS — R7303 Prediabetes: Secondary | ICD-10-CM | POA: Diagnosis not present

## 2021-11-24 DIAGNOSIS — R945 Abnormal results of liver function studies: Secondary | ICD-10-CM | POA: Diagnosis not present

## 2021-11-26 DIAGNOSIS — R748 Abnormal levels of other serum enzymes: Secondary | ICD-10-CM | POA: Diagnosis not present

## 2021-11-26 DIAGNOSIS — R7303 Prediabetes: Secondary | ICD-10-CM | POA: Diagnosis not present

## 2021-11-26 DIAGNOSIS — M25561 Pain in right knee: Secondary | ICD-10-CM | POA: Diagnosis not present

## 2021-11-26 DIAGNOSIS — G4700 Insomnia, unspecified: Secondary | ICD-10-CM | POA: Diagnosis not present

## 2021-11-26 DIAGNOSIS — F419 Anxiety disorder, unspecified: Secondary | ICD-10-CM | POA: Diagnosis not present

## 2021-11-26 DIAGNOSIS — Z23 Encounter for immunization: Secondary | ICD-10-CM | POA: Diagnosis not present

## 2021-11-26 DIAGNOSIS — K5900 Constipation, unspecified: Secondary | ICD-10-CM | POA: Diagnosis not present

## 2021-11-26 DIAGNOSIS — K219 Gastro-esophageal reflux disease without esophagitis: Secondary | ICD-10-CM | POA: Diagnosis not present

## 2021-11-26 DIAGNOSIS — M5440 Lumbago with sciatica, unspecified side: Secondary | ICD-10-CM | POA: Diagnosis not present

## 2021-11-26 DIAGNOSIS — Z0001 Encounter for general adult medical examination with abnormal findings: Secondary | ICD-10-CM | POA: Diagnosis not present

## 2021-11-26 DIAGNOSIS — Z21 Asymptomatic human immunodeficiency virus [HIV] infection status: Secondary | ICD-10-CM | POA: Diagnosis not present

## 2021-12-01 ENCOUNTER — Encounter (INDEPENDENT_AMBULATORY_CARE_PROVIDER_SITE_OTHER): Payer: Self-pay | Admitting: *Deleted

## 2021-12-01 DIAGNOSIS — G4733 Obstructive sleep apnea (adult) (pediatric): Secondary | ICD-10-CM | POA: Diagnosis not present

## 2021-12-09 DIAGNOSIS — K5900 Constipation, unspecified: Secondary | ICD-10-CM | POA: Diagnosis not present

## 2021-12-09 DIAGNOSIS — G4700 Insomnia, unspecified: Secondary | ICD-10-CM | POA: Diagnosis not present

## 2021-12-20 ENCOUNTER — Ambulatory Visit
Admission: EM | Admit: 2021-12-20 | Discharge: 2021-12-20 | Disposition: A | Discharge status: Home / Self Care | Payer: Medicaid Other | Attending: Family Medicine | Admitting: Family Medicine

## 2021-12-20 ENCOUNTER — Encounter: Payer: Self-pay | Admitting: Emergency Medicine

## 2021-12-20 DIAGNOSIS — K0889 Other specified disorders of teeth and supporting structures: Secondary | ICD-10-CM | POA: Diagnosis not present

## 2021-12-20 MED ORDER — CHLORHEXIDINE GLUCONATE 0.12 % MT SOLN: 15.0000 mL | Freq: Two times a day (BID) | OROMUCOSAL | 0 refills | Status: DC

## 2021-12-20 MED ORDER — AMOXICILLIN-POT CLAVULANATE 875-125 MG PO TABS: 1.0000 | ORAL_TABLET | Freq: Two times a day (BID) | ORAL | 0 refills | Status: DC

## 2021-12-20 NOTE — ED Triage Notes (Signed)
Patient states that her left lower dental pain for 3 days.  Patient has nausea, unable to eat or sleep.  Patient has taken Tylenol, Ibuprofen '800mg'$  and Tramadol.

## 2021-12-20 NOTE — ED Provider Notes (Signed)
[facility]    CSN: [id number] Arrival date & time: 12/20/21  1535      History   Chief Complaint Chief Complaint  Patient presents with   Oral Swelling    HPI Chelsey Henson is a 51 y.o. female.   Patient presenting today with left lower dental pain and swelling for the past 3 days.  Has been taking over-the-counter pain relievers and doing salt water gargles with minimal relief.  Denies fever, chills, difficulty breathing or swallowing, drainage to the mouth.    Past Medical History:  Diagnosis Date   Anemia    Anxiety    Asthma    COPD (chronic obstructive pulmonary disease) ([place]) 04/05/2016   Phreesia 05/28/2020   Depression    Depression    Phreesia 05/28/2020   Genital herpes    GERD (gastroesophageal reflux disease)    HIV (human immunodeficiency virus infection) ([place]) 11/24/2017   HIV infection ([place]) 2008   Hypertension    Sleep apnea    Substance abuse ([place])    cocaine- last use was Oct 2009   Vaginal Pap smear, abnormal     Patient Active Problem List   Diagnosis Date Noted   Lipoma of buttock    Rash 07/23/2020   Post traumatic stress disorder (PTSD) 04/30/2019   Anxiety 04/30/2019   Encounter to establish care 10/27/2018   Transaminitis 05/24/2017   Gastroesophageal reflux disease without esophagitis 04/06/2017   MDD (major depressive disorder), recurrent episode, moderate ([place]) 09/08/2016   Cocaine use disorder, moderate, in sustained remission ([place]) 09/08/2016   OSA (obstructive sleep apnea) 04/15/2016   Asthma 04/15/2016   HIV (human immunodeficiency virus infection) ([place]) 04/05/2016   Essential hypertension 04/05/2016    Past Surgical History:  Procedure Laterality Date   ABDOMINAL HYSTERECTOMY     BENIGN MASS; total hysterectomy   CESAREAN SECTION     ESOPHAGOGASTRODUODENOSCOPY N/A 06/30/2017   Procedure: ESOPHAGOGASTRODUODENOSCOPY (EGD);  Surgeon: Rogene Houston, MD;  Location: [facility];  Service: Endoscopy;   Laterality: N/A;  2:00   HERNIA REPAIR  2017   LIPOMA EXCISION Left 08/14/2021   Procedure: EXCISION LIPOMA, LEFT BUTTOCKS;  Surgeon: Aviva Signs, MD;  Location: [facility];  Service: General;  Laterality: Left;    OB History     Gravida  6   Para  4   Term  4   Preterm      AB  2   Living  4      SAB  2   IAB      Ectopic      Multiple      Live Births               Home Medications    Prior to Admission medications   Medication Sig Start Date End Date Taking? Authorizing Provider  acyclovir (ZOVIRAX) 800 MG tablet TAKE (1) TABLET BY MOUTH ONCE DAILY. 07/18/20  Yes Noreene Larsson, NP  albuterol (PROVENTIL HFA;VENTOLIN HFA) 108 (90 Base) MCG/ACT inhaler Inhale 2 puffs into the lungs every 4 (four) hours as needed for wheezing or shortness of breath. 07/09/17  Yes Noemi Chapel, MD  ALPRAZolam Duanne Moron) 0.5 MG tablet Take 0.5 mg by mouth 3 (three) times daily as needed for anxiety. 10/20/20  Yes [provider]  amoxicillin-clavulanate (AUGMENTIN) 875-125 MG tablet Take 1 tablet by mouth every 12 (twelve) hours. 12/20/21  Yes Volney American, PA-C  atorvastatin (LIPITOR) 40 MG tablet Take 40 mg by mouth daily.  Yes [provider]  chlorhexidine (PERIDEX) 0.12 % solution Use as directed 15 mLs in the mouth or throat 2 (two) times daily. 12/20/21  Yes Volney American, PA-C  cyclobenzaprine (FLEXERIL) 10 MG tablet Take 1 tablet (10 mg total) by mouth 2 (two) times daily as needed for muscle spasms. 08/02/21  Yes Marcello Fennel, PA-C  elvitegravir-cobicistat-emtricitabine-tenofovir (GENVOYA) 150-150-200-10 MG TABS tablet Take 1 tablet by mouth daily. 03/05/21  Yes Comer, Okey Regal, MD  escitalopram (LEXAPRO) 20 MG tablet TAKE (1) TABLET BY MOUTH ONCE DAILY. 06/12/20  Yes Noreene Larsson, NP  estradiol (ESTRACE) 1 MG tablet Take 1 mg by mouth every evening.    Yes [provider]  HYDROcodone-acetaminophen (NORCO) 5-325 MG tablet Take 1  tablet by mouth every 4 (four) hours as needed for moderate pain. 08/14/21  Yes Aviva Signs, MD  lidocaine (LIDODERM) 5 % Place 1 patch onto the skin daily. Remove & Discard patch within 12 hours or as directed by MD 05/07/21  Yes Idol, Almyra Free, PA-C  Melatonin 1 MG CAPS Take 2 mg by mouth at bedtime as needed (sleep).   Yes [provider]  naproxen (NAPROSYN) 500 MG tablet Take 1 tablet (500 mg total) by mouth 2 (two) times daily. Patient taking differently: Take 500 mg by mouth 2 (two) times daily as needed for moderate pain. 09/01/20  Yes Scot Jun, FNP  nystatin (MYCOSTATIN/NYSTOP) powder Apply 1 Application topically 2 (two) times daily. 11/20/21  Yes Volney American, PA-C  nystatin cream (MYCOSTATIN) Apply to affected area 2 times daily 11/20/21  Yes Volney American, PA-C  QUEtiapine (SEROQUEL) 100 MG tablet Take 1 tablet (100 mg total) by mouth at bedtime. 06/06/19  Yes [place] Crane, MD  QUEtiapine (SEROQUEL) 200 MG tablet Take 200 mg by mouth at bedtime. 08/04/21  Yes [provider]  tiZANidine (ZANAFLEX) 2 MG tablet Take 1-2 tablets (2-4 mg total) by mouth 2 (two) times daily as needed for muscle spasms. 04/17/21  Yes Jaynee Eagles, PA-C  traZODone (DESYREL) 100 MG tablet Take 100 mg by mouth at bedtime as needed for sleep. 08/04/21  Yes [provider]  triamcinolone cream (KENALOG) 0.1 % Apply 1 application topically 2 (two) times daily as needed. 09/01/20  Yes Scot Jun, FNP  Vibegron (GEMTESA) 75 MG TABS Take 75 mg by mouth daily. 09/07/21  Yes Stoneking, Reece Leader., MD  budesonide-formoterol [facility]) 160-4.5 MCG/ACT inhaler Inhale 2 puffs into the lungs 2 (two) times daily.  09/25/18  [provider]  montelukast (SINGULAIR) 10 MG tablet Take 1 tablet (10 mg total) by mouth at bedtime. 07/26/17 09/25/18  Comer, Okey Regal, MD    Family History Family History  Problem Relation Age of Onset   Depression Mother    Hyperlipidemia Mother     Learning disabilities Mother    Alcohol abuse Mother 37   Alcohol abuse Father    Arthritis Father    Cancer Father    Heart disease Father    Hypertension Father    Learning disabilities Father    Alcohol abuse Sister        RECOVERED   Alcohol abuse Brother    Alcohol abuse Brother    Alcohol abuse Brother     Social History Social History   Tobacco Use   Smoking status: Never   Smokeless tobacco: Never  Vaping Use   Vaping Use: Never used  Substance Use Topics   Alcohol use: Yes  Alcohol/week: 1.0 standard drink of alcohol    Types: 1 Cans of beer per week    Comment: occ.   Drug use: No    Comment: none in 7 years     Allergies   Abilify [aripiprazole] and Codeine   Review of Systems Review of Systems Per HPI  Physical Exam Triage Vital Signs ED Triage Vitals  Enc Vitals Group     BP 12/20/21 1542 (!) 139/93     Pulse Rate 12/20/21 1542 93     Resp 12/20/21 1542 18     Temp 12/20/21 1542 98.7 F (37.1 C)     Temp Source 12/20/21 1542 Oral     SpO2 12/20/21 1542 92 %     Weight --      Height --      Head Circumference --      Peak Flow --      Pain Score 12/20/21 1544 8     Pain Loc --      Pain Edu? --      Excl. in [place]? --    No data found.  Updated Vital Signs BP (!) 139/93 (BP Location: Right Arm)   Pulse 93   Temp 98.7 F (37.1 C) (Oral)   Resp 18   LMP 09/30/2014 (Approximate)   SpO2 92%   Visual Acuity Right Eye Distance:   Left Eye Distance:   Bilateral Distance:    Right Eye Near:   Left Eye Near:    Bilateral Near:     Physical Exam Vitals and nursing note reviewed.  Constitutional:      Appearance: Normal appearance. She is not ill-appearing.  HENT:     Head: Atraumatic.     Mouth/Throat:     Mouth: Mucous membranes are moist.     Comments: Several left lower molars have been extracted, gingival erythema to area Eyes:     Extraocular Movements: Extraocular movements intact.     Conjunctiva/sclera:  Conjunctivae normal.  Cardiovascular:     Rate and Rhythm: Normal rate and regular rhythm.     Heart sounds: Normal heart sounds.  Pulmonary:     Effort: Pulmonary effort is normal.     Breath sounds: Normal breath sounds.  Musculoskeletal:        General: Normal range of motion.     Cervical back: Normal range of motion and neck supple.  Skin:    General: Skin is warm and dry.  Neurological:     Mental Status: She is alert and oriented to person, place, and time.  Psychiatric:        Mood and Affect: Mood normal.        Thought Content: Thought content normal.        Judgment: Judgment normal.      UC Treatments / Results  Labs (all labs ordered are listed, but only abnormal results are displayed) Labs Reviewed - No data to display  EKG   Radiology No results found.  Procedures Procedures (including critical care time)  Medications Ordered in UC Medications - No data to display  Initial Impression / Assessment and Plan / UC Course  I have reviewed the triage vital signs and the nursing notes.  Pertinent labs & imaging results that were available during my care of the patient were reviewed by me and considered in my medical decision making (see chart for details).     We will cover for infection with Augmentin, Peridex and continue salt water gargles,  over-the-counter pain relievers additionally.  Follow-up with dentist as soon as possible for recheck.  Final Clinical Impressions(s) / UC Diagnoses   Final diagnoses:  Pain, dental   Discharge Instructions   None    ED Prescriptions     Medication Sig Dispense Auth. Provider   chlorhexidine (PERIDEX) 0.12 % solution Use as directed 15 mLs in the mouth or throat 2 (two) times daily. 120 mL Volney American, PA-C   amoxicillin-clavulanate (AUGMENTIN) 875-125 MG tablet Take 1 tablet by mouth every 12 (twelve) hours. 14 tablet Volney American, [place]      PDMP not reviewed this encounter.    Volney American, [place] 12/20/21 1601

## 2021-12-21 ENCOUNTER — Encounter (HOSPITAL_COMMUNITY): Payer: Self-pay | Admitting: *Deleted

## 2021-12-21 ENCOUNTER — Other Ambulatory Visit: Payer: Self-pay

## 2021-12-21 ENCOUNTER — Emergency Department (HOSPITAL_COMMUNITY)
Admission: EM | Admit: 2021-12-21 | Discharge: 2021-12-21 | Disposition: A | Discharge status: Home / Self Care | Payer: Medicaid Other | Attending: Emergency Medicine | Admitting: Emergency Medicine

## 2021-12-21 DIAGNOSIS — R112 Nausea with vomiting, unspecified: Secondary | ICD-10-CM | POA: Insufficient documentation

## 2021-12-21 DIAGNOSIS — K0889 Other specified disorders of teeth and supporting structures: Secondary | ICD-10-CM | POA: Diagnosis not present

## 2021-12-21 DIAGNOSIS — Z21 Asymptomatic human immunodeficiency virus [HIV] infection status: Secondary | ICD-10-CM | POA: Diagnosis not present

## 2021-12-21 DIAGNOSIS — I10 Essential (primary) hypertension: Secondary | ICD-10-CM | POA: Insufficient documentation

## 2021-12-21 DIAGNOSIS — J449 Chronic obstructive pulmonary disease, unspecified: Secondary | ICD-10-CM | POA: Diagnosis not present

## 2021-12-21 DIAGNOSIS — J45909 Unspecified asthma, uncomplicated: Secondary | ICD-10-CM | POA: Diagnosis not present

## 2021-12-21 DIAGNOSIS — Z789 Other specified health status: Secondary | ICD-10-CM

## 2021-12-21 MED ORDER — ONDANSETRON HCL 4 MG PO TABS: 4.0000 mg | ORAL_TABLET | Freq: Four times a day (QID) | ORAL | 0 refills | Status: DC

## 2021-12-21 MED ORDER — HYDROCODONE-ACETAMINOPHEN 5-325 MG PO TABS
1.0000 | ORAL_TABLET | Freq: Once | ORAL | Status: AC
  Administered 2021-12-21: 1 via ORAL
  Filled 2021-12-21: qty 1

## 2021-12-21 MED ORDER — HYDROCODONE-ACETAMINOPHEN 5-325 MG PO TABS: ORAL_TABLET | ORAL | 0 refills | Status: DC

## 2021-12-21 MED ORDER — ONDANSETRON 4 MG PO TBDP
4.0000 mg | ORAL_TABLET | Freq: Once | ORAL | Status: AC
  Administered 2021-12-21: 4 mg via ORAL
  Filled 2021-12-21: qty 1

## 2021-12-21 NOTE — Discharge Instructions (Signed)
Take the Zofran 30 minutes before you take your antibiotic.  Continue frequent small meals and frequent fluids.  Sure to keep your appointment with your dentist for Wednesday.  Return to the emergency department for any new or worsening symptoms.

## 2021-12-21 NOTE — ED Notes (Signed)
Pt d/c home per EDP order. Discharge summary reviewed with pt, pt verbalizes understanding. Ambulatory off unit. No s/s of acute distress noted at discharge. Reports husband is discharge ride home.

## 2021-12-21 NOTE — ED Triage Notes (Signed)
Pt with left facial pain, seen at [facility] for dental pain yesterday.  Started antibiotics last night and dose this morning.  + nausea, emesis x 4 today.

## 2021-12-23 NOTE — ED Provider Notes (Signed)
[facility] EMERGENCY DEPARTMENT Provider Note   CSN: [id number] Arrival date & time: 12/21/21  1426     History  Chief Complaint  Patient presents with   Abscess    Chelsey Henson is a 51 y.o. female.   Abscess Associated symptoms: nausea and vomiting   Associated symptoms: no fever and no headaches        Chelsey Henson is a 51 y.o. female with past medical history of HIV, hypertension, asthma, anemia, COPD who presents to the Emergency Department complaining of left facial pain and tooth pain for several days.  Worse yesterday.  She was seen at urgent care for her symptoms.  She was started on Augmentin on the evening prior to arrival.  She also took a dose earlier this morning.  She notes having nausea and approximately 4 episodes of vomiting since taking the antibiotic.  She states her tooth pain has improved somewhat since starting the antibiotic and would like to continue taking it, but requesting medication for nausea.  She denies any facial swelling, neck pain, difficulty swallowing or breathing.  No fever or chills.  No abdominal pain, chest pain or shortness of breath.   Home Medications Prior to Admission medications   Medication Sig Start Date End Date Taking? Authorizing Provider  HYDROcodone-acetaminophen (NORCO/VICODIN) 5-325 MG tablet Take one tab po q 4 hrs prn pain 12/21/21  Yes Oshua Mcconaha, PA-C  ondansetron (ZOFRAN) 4 MG tablet Take 1 tablet (4 mg total) by mouth every 6 (six) hours. 12/21/21  Yes Kathi Dohn, PA-C  acyclovir (ZOVIRAX) 800 MG tablet TAKE (1) TABLET BY MOUTH ONCE DAILY. 07/18/20   Noreene Larsson, NP  albuterol (PROVENTIL HFA;VENTOLIN HFA) 108 (90 Base) MCG/ACT inhaler Inhale 2 puffs into the lungs every 4 (four) hours as needed for wheezing or shortness of breath. 07/09/17   Noemi Chapel, MD  ALPRAZolam Duanne Moron) 0.5 MG tablet Take 0.5 mg by mouth 3 (three) times daily as needed for anxiety. 10/20/20   [provider]   amoxicillin-clavulanate (AUGMENTIN) 875-125 MG tablet Take 1 tablet by mouth every 12 (twelve) hours. 12/20/21   Volney American, PA-C  atorvastatin (LIPITOR) 40 MG tablet Take 40 mg by mouth daily.    [provider]  chlorhexidine (PERIDEX) 0.12 % solution Use as directed 15 mLs in the mouth or throat 2 (two) times daily. 12/20/21   Volney American, PA-C  cyclobenzaprine (FLEXERIL) 10 MG tablet Take 1 tablet (10 mg total) by mouth 2 (two) times daily as needed for muscle spasms. 08/02/21   Marcello Fennel, PA-C  elvitegravir-cobicistat-emtricitabine-tenofovir (GENVOYA) 150-150-200-10 MG TABS tablet Take 1 tablet by mouth daily. 03/05/21   Comer, Okey Regal, MD  escitalopram (LEXAPRO) 20 MG tablet TAKE (1) TABLET BY MOUTH ONCE DAILY. 06/12/20   Noreene Larsson, NP  estradiol (ESTRACE) 1 MG tablet Take 1 mg by mouth every evening.     [provider]  lidocaine (LIDODERM) 5 % Place 1 patch onto the skin daily. Remove & Discard patch within 12 hours or as directed by MD 05/07/21   Evalee Jefferson, PA-C  Melatonin 1 MG CAPS Take 2 mg by mouth at bedtime as needed (sleep).    [provider]  naproxen (NAPROSYN) 500 MG tablet Take 1 tablet (500 mg total) by mouth 2 (two) times daily. Patient taking differently: Take 500 mg by mouth 2 (two) times daily as needed for moderate pain. 09/01/20   Scot Jun, FNP  nystatin (MYCOSTATIN/NYSTOP) powder Apply  1 Application topically 2 (two) times daily. 11/20/21   Volney American, PA-C  nystatin cream (MYCOSTATIN) Apply to affected area 2 times daily 11/20/21   Volney American, PA-C  QUEtiapine (SEROQUEL) 100 MG tablet Take 1 tablet (100 mg total) by mouth at bedtime. 06/06/19   [place] Crane, MD  QUEtiapine (SEROQUEL) 200 MG tablet Take 200 mg by mouth at bedtime. 08/04/21   [provider]  tiZANidine (ZANAFLEX) 2 MG tablet Take 1-2 tablets (2-4 mg total) by mouth 2 (two) times daily as needed for muscle  spasms. 04/17/21   Jaynee Eagles, PA-C  traZODone (DESYREL) 100 MG tablet Take 100 mg by mouth at bedtime as needed for sleep. 08/04/21   [provider]  triamcinolone cream (KENALOG) 0.1 % Apply 1 application topically 2 (two) times daily as needed. 09/01/20   Scot Jun, FNP  Vibegron (GEMTESA) 75 MG TABS Take 75 mg by mouth daily. 09/07/21   Stoneking, Reece Leader., MD  budesonide-formoterol (SYMBICORT) 160-4.5 MCG/ACT inhaler Inhale 2 puffs into the lungs 2 (two) times daily.  09/25/18  [provider]  montelukast (SINGULAIR) 10 MG tablet Take 1 tablet (10 mg total) by mouth at bedtime. 07/26/17 09/25/18  Thayer Headings, MD      Allergies    Abilify [aripiprazole] and Codeine    Review of Systems   Review of Systems  Constitutional:  Negative for chills and fever.  HENT:  Positive for dental problem. Negative for ear pain, facial swelling, sore throat and trouble swallowing.   Respiratory:  Negative for shortness of breath.   Cardiovascular:  Negative for chest pain.  Gastrointestinal:  Positive for nausea and vomiting. Negative for abdominal pain, constipation and diarrhea.  Genitourinary:  Negative for dysuria.  Neurological:  Negative for dizziness and headaches.    Physical Exam Updated Vital Signs BP 112/80   Pulse (!) 101   Temp 98.4 F (36.9 C) (Oral)   Resp 18   Ht '5\' 4"'$  (1.626 m)   Wt 118.4 kg   LMP 09/30/2014 (Approximate)   SpO2 97%   BMI 44.80 kg/m  Physical Exam Vitals and nursing note reviewed.  Constitutional:      General: She is not in acute distress.    Appearance: Normal appearance. She is not ill-appearing or toxic-appearing.  HENT:     Mouth/Throat:     Mouth: Mucous membranes are moist.     Dentition: Dental tenderness present. No gingival swelling or dental abscesses.     Pharynx: Oropharynx is clear. Uvula midline. No uvula swelling.      Comments: Tenderness to palpation of the left lower premolar.  Partially edentulous.  No  erythema or fluctuance of the surrounding gingiva.  Uvula midline nonedematous.  No edema or erythema of the oropharynx.  No trismus.  No sublingual abnormality. Cardiovascular:     Rate and Rhythm: Normal rate and regular rhythm.     Pulses: Normal pulses.  Pulmonary:     Effort: Pulmonary effort is normal.     Breath sounds: Normal breath sounds.  Abdominal:     Palpations: Abdomen is soft.     Tenderness: There is no abdominal tenderness.  Musculoskeletal:     Cervical back: No rigidity.  Lymphadenopathy:     Cervical: No cervical adenopathy.  Skin:    General: Skin is warm.     Capillary Refill: Capillary refill takes less than 2 seconds.  Neurological:     General: No focal deficit present.  Mental Status: She is alert.     Sensory: No sensory deficit.     Motor: No weakness.     ED Results / Procedures / Treatments   Labs (all labs ordered are listed, but only abnormal results are displayed) Labs Reviewed - No data to display  EKG None  Radiology No results found.  Procedures Procedures    Medications Ordered in ED Medications  ondansetron (ZOFRAN-ODT) disintegrating tablet 4 mg (4 mg Oral Given 12/21/21 1628)  HYDROcodone-acetaminophen (NORCO/VICODIN) 5-325 MG per tablet 1 tablet (1 tablet Oral Given 12/21/21 1628)    ED Course/ Medical Decision Making/ A&P                           Medical Decision Making Patient here with complaint of dental pain, nausea and vomiting after starting Augmentin prescribed 1 day prior at urgent care.  She denies any neck pain, difficulty swallowing, difficulty opening closing her mouth, abdominal pain fever or chills.  On exam, patient well-appearing nontoxic.  No active vomiting.  Abdomen is soft nontender without peritoneal signs.  No trismus, facial swelling or sublingual abnormality.  No clinical suspicion for acute abdominal process or Ludewig's angina.  No drainable abscess seen on exam.  Patient with likely  intolerance of Augmentin due to GI upset.  I have offered a different antibiotic, but patient declined stating that she feels the Augmentin is working and would like to continue to take it but requests medication for her nausea and pain.  Database reviewed.  Amount and/or Complexity of Data Reviewed Discussion of management or test interpretation with external provider(s): Patient appears appropriate for discharge home, no concerning emergent process.  Her nausea vomiting likely due to intolerance of Augmentin which is felt to be the cause of her GI upset.  I will provide short course of pain medication and prescription for ondansetron for her nausea vomiting.  She will follow-up closely with dentistry.  Return precautions were discussed.  Risk Prescription drug management.           Final Clinical Impression(s) / ED Diagnoses Final diagnoses:  Pain, dental  Medication intolerance  Nausea and vomiting, unspecified vomiting type    Rx / DC Orders ED Discharge Orders          Ordered    ondansetron (ZOFRAN) 4 MG tablet  Every 6 hours        12/21/21 1644    HYDROcodone-acetaminophen (NORCO/VICODIN) 5-325 MG tablet        12/21/21 1644              Kem Parkinson, PA-C 12/23/21 1533    Milton Ferguson, MD 12/29/21 1120

## 2022-01-05 ENCOUNTER — Other Ambulatory Visit: Payer: Medicaid Other

## 2022-01-06 ENCOUNTER — Other Ambulatory Visit: Payer: Medicaid Other

## 2022-01-06 ENCOUNTER — Other Ambulatory Visit: Payer: Self-pay

## 2022-01-06 ENCOUNTER — Other Ambulatory Visit (HOSPITAL_COMMUNITY)
Admission: RE | Admit: 2022-01-06 | Discharge: 2022-01-06 | Disposition: A | Discharge status: Home / Self Care | Payer: Medicaid Other | Source: Ambulatory Visit | Attending: Internal Medicine | Admitting: Internal Medicine

## 2022-01-06 DIAGNOSIS — Z113 Encounter for screening for infections with a predominantly sexual mode of transmission: Secondary | ICD-10-CM | POA: Insufficient documentation

## 2022-01-06 DIAGNOSIS — B20 Human immunodeficiency virus [HIV] disease: Secondary | ICD-10-CM

## 2022-01-07 LAB — URINE CYTOLOGY ANCILLARY ONLY
Chlamydia: NEGATIVE
Comment: NEGATIVE
Comment: NORMAL
Neisseria Gonorrhea: NEGATIVE

## 2022-01-07 LAB — T-HELPER CELL (CD4) - (RCID CLINIC ONLY)
CD4 % Helper T Cell: 35 % (ref 33–65)
CD4 T Cell Abs: 703 /uL (ref 400–1790)

## 2022-01-09 LAB — CBC WITH DIFFERENTIAL/PLATELET
Absolute Monocytes: 1020 cells/uL — ABNORMAL HIGH (ref 200–950)
Basophils Absolute: 59 cells/uL (ref 0–200)
Basophils Relative: 0.6 %
Eosinophils Absolute: 149 cells/uL (ref 15–500)
Eosinophils Relative: 1.5 %
HCT: 44.5 % (ref 35.0–45.0)
Hemoglobin: 15.6 g/dL — ABNORMAL HIGH (ref 11.7–15.5)
Lymphs Abs: 2178 cells/uL (ref 850–3900)
MCH: 33.7 pg — ABNORMAL HIGH (ref 27.0–33.0)
MCHC: 35.1 g/dL (ref 32.0–36.0)
MCV: 96.1 fL (ref 80.0–100.0)
MPV: 11.6 fL (ref 7.5–12.5)
Monocytes Relative: 10.3 %
Neutro Abs: 6494 cells/uL (ref 1500–7800)
Neutrophils Relative %: 65.6 %
Platelets: 323 10*3/uL (ref 140–400)
RBC: 4.63 10*6/uL (ref 3.80–5.10)
RDW: 13.5 % (ref 11.0–15.0)
Total Lymphocyte: 22 %
WBC: 9.9 10*3/uL (ref 3.8–10.8)

## 2022-01-09 LAB — COMPLETE METABOLIC PANEL WITH GFR
AG Ratio: 1.6 (calc) (ref 1.0–2.5)
ALT: 387 U/L — ABNORMAL HIGH (ref 6–29)
AST: 202 U/L — ABNORMAL HIGH (ref 10–35)
Albumin: 4.5 g/dL (ref 3.6–5.1)
Alkaline phosphatase (APISO): 55 U/L (ref 37–153)
BUN: 15 mg/dL (ref 7–25)
CO2: 30 mmol/L (ref 20–32)
Calcium: 9.7 mg/dL (ref 8.6–10.4)
Chloride: 103 mmol/L (ref 98–110)
Creat: 0.65 mg/dL (ref 0.50–1.03)
Globulin: 2.8 g/dL (calc) (ref 1.9–3.7)
Glucose, Bld: 84 mg/dL (ref 65–99)
Potassium: 3.9 mmol/L (ref 3.5–5.3)
Sodium: 140 mmol/L (ref 135–146)
Total Bilirubin: 1 mg/dL (ref 0.2–1.2)
Total Protein: 7.3 g/dL (ref 6.1–8.1)
eGFR: 107 mL/min/{1.73_m2} (ref >=60)

## 2022-01-09 LAB — LIPID PANEL
Cholesterol: 222 mg/dL — ABNORMAL HIGH (ref <200)
HDL: 64 mg/dL (ref >=50)
LDL Cholesterol (Calc): 133 mg/dL (calc) — ABNORMAL HIGH
Non-HDL Cholesterol (Calc): 158 mg/dL (calc) — ABNORMAL HIGH (ref <130)
Total CHOL/HDL Ratio: 3.5 (calc) (ref <5.0)
Triglycerides: 130 mg/dL (ref <150)

## 2022-01-09 LAB — HIV-1 RNA QUANT-NO REFLEX-BLD
HIV 1 RNA Quant: NOT DETECTED Copies/mL
HIV-1 RNA Quant, Log: NOT DETECTED Log cps/mL

## 2022-01-09 LAB — RPR: RPR Ser Ql: NONREACTIVE

## 2022-01-19 ENCOUNTER — Ambulatory Visit: Payer: Medicaid Other | Admitting: Internal Medicine

## 2022-01-19 ENCOUNTER — Telehealth: Payer: Self-pay | Admitting: Pharmacist

## 2022-01-19 ENCOUNTER — Other Ambulatory Visit: Payer: Self-pay

## 2022-01-19 ENCOUNTER — Other Ambulatory Visit (HOSPITAL_COMMUNITY): Payer: Self-pay

## 2022-01-19 ENCOUNTER — Other Ambulatory Visit: Payer: Self-pay | Admitting: Pharmacist

## 2022-01-19 ENCOUNTER — Encounter: Payer: Self-pay | Admitting: Internal Medicine

## 2022-01-19 VITALS — BP 169/114 | HR 94 | Resp 16 | Ht 64.0 in | Wt 258.0 lb

## 2022-01-19 DIAGNOSIS — B20 Human immunodeficiency virus [HIV] disease: Secondary | ICD-10-CM | POA: Diagnosis not present

## 2022-01-19 DIAGNOSIS — Z113 Encounter for screening for infections with a predominantly sexual mode of transmission: Secondary | ICD-10-CM

## 2022-01-19 DIAGNOSIS — R7401 Elevation of levels of liver transaminase levels: Secondary | ICD-10-CM

## 2022-01-19 DIAGNOSIS — I10 Essential (primary) hypertension: Secondary | ICD-10-CM | POA: Diagnosis not present

## 2022-01-19 HISTORY — DX: Encounter for screening for infections with a predominantly sexual mode of transmission: Z11.3

## 2022-01-19 MED ORDER — CABOTEGRAVIR & RILPIVIRINE ER 600 & 900 MG/3ML IM SUER: 1.0000 | INTRAMUSCULAR | 1 refills | Status: DC

## 2022-01-19 NOTE — Progress Notes (Signed)
   Subjective:    Patient ID: Chelsey Henson, female    DOB: Dec 21, 1970, 51 y.o.   MRN: [id number]  HPI She is here for follow up of HIV She has been on Genvoya and  doing well.  No new concerns.  No missed doses and viral load suppressed.  No history of resistance. Aksing about Cabenuva.  Has a blood pressure cuff at home.    Review of Systems  Constitutional:  Negative for fatigue.  Gastrointestinal:  Negative for diarrhea and nausea.  Skin:  Negative for rash.       Objective:   Physical Exam Eyes:     General: No scleral icterus. Pulmonary:     Effort: Pulmonary effort is normal.  Neurological:     Mental Status: She is alert.   SH: no tobacco        Assessment & Plan:

## 2022-01-19 NOTE — Assessment & Plan Note (Signed)
BP is up and she will recheck at home and follow up with her PCP

## 2022-01-19 NOTE — Assessment & Plan Note (Signed)
A bit more elevated this time.  She has fatty liver which is likely the driver of it.  She is going to start a weight loss program.

## 2022-01-19 NOTE — Telephone Encounter (Signed)
   Assessment: Chelsey Henson is in clinic today seeing Dr. Linus Salmons for HIV follow-up  on Genvoya. She was interested in [place] injections after seeing them advertised on TV. Counseled that [place] is two separate intramuscular injections in the gluteal muscle on each side for each visit. Explained that the second injection is 30 days after the initial injection then every 2 months thereafter. Discussed the rare but significant chance of developing resistance despite compliance. Explained that showing up to injection appointments is very important and warned that if 2 appointments are missed, it will be reassessed by their provider whether they are a good candidate for injection therapy. Counseled on possible side effects associated with the injections such as injection site pain, which is usually mild to moderate in nature, injection site nodules, and injection site reactions. Asked to call the clinic or send me a mychart message if they experience any issues, such as fatigue, nausea, headache, rash, or dizziness. Advised that they can take ibuprofen or tylenol for injection site pain if needed.   Adria Dill, PharmD PGY-2 Infectious Diseases Resident  01/19/2022 3:08 PM

## 2022-01-19 NOTE — Assessment & Plan Note (Signed)
Screened negative 

## 2022-01-19 NOTE — Assessment & Plan Note (Signed)
She is doing well, no issues.  Cabenuva discussed with her and will start this when possible.  She knows to continue with genovya until then.  She is ok coming in to clinic for doses.

## 2022-03-09 ENCOUNTER — Encounter (INDEPENDENT_AMBULATORY_CARE_PROVIDER_SITE_OTHER): Payer: Self-pay | Admitting: *Deleted

## 2022-03-16 ENCOUNTER — Telehealth: Payer: Self-pay

## 2022-03-16 NOTE — Telephone Encounter (Signed)
Patient called requesting refills of Genvoya, says she has 4 pills left. She is not sure if she should continue with this or if she is supposed to start [place]. Would like to discuss with pharmacy.   Beryle Flock, RN

## 2022-03-18 ENCOUNTER — Other Ambulatory Visit (HOSPITAL_COMMUNITY): Payer: Self-pay

## 2022-03-18 ENCOUNTER — Telehealth: Payer: Self-pay | Admitting: Pharmacist

## 2022-03-18 ENCOUNTER — Other Ambulatory Visit: Payer: Self-pay

## 2022-03-18 DIAGNOSIS — B20 Human immunodeficiency virus [HIV] disease: Secondary | ICD-10-CM

## 2022-03-18 MED ORDER — GENVOYA 150-150-200-10 MG PO TABS: 1.0000 | ORAL_TABLET | Freq: Every day | ORAL | 0 refills | Status: DC

## 2022-03-18 MED ORDER — CABOTEGRAVIR & RILPIVIRINE ER 600 & 900 MG/3ML IM SUER
1.0000 | INTRAMUSCULAR | 1 refills | Status: DC
  Filled 2022-03-18 (×2): qty 6, 30d supply, fill #0
  Filled 2022-04-09: qty 6, 30d supply, fill #1

## 2022-03-18 MED ORDER — CABOTEGRAVIR & RILPIVIRINE ER 600 & 900 MG/3ML IM SUER
1.0000 | INTRAMUSCULAR | 5 refills | Status: DC
  Filled 2022-03-18: qty 6, 60d supply, fill #0

## 2022-03-18 NOTE — Telephone Encounter (Signed)
All set!

## 2022-03-18 NOTE — Telephone Encounter (Signed)
Patient called today requesting follow up on Cabenuva injections. Sent prescriptions today to [facility]; will see her next Thursday for her first injections. She had no questions or concerns about the shots. Refilled Genvoya x 30 days to last her until her appointment as she only has 2 tablets left.  Alfonse Spruce, PharmD, CPP, BCIDP, [place] Clinical Pharmacist Practitioner Infectious [place] for Infectious [facility]

## 2022-03-22 ENCOUNTER — Telehealth: Payer: Self-pay

## 2022-03-22 NOTE — Telephone Encounter (Signed)
[facility] Patient Advocate Encounter  Patient's medication Kern Reap) have been couriered to [facility] from [place] [facility] and will be administered on the patient next office visit on 03/25/22.  Ileene [place] , [facility] Patient [facility] for Infectious Disease Phone: [phone] Fax:  [phone]

## 2022-03-25 ENCOUNTER — Ambulatory Visit: Payer: Medicaid Other | Admitting: Pharmacist

## 2022-03-29 ENCOUNTER — Ambulatory Visit
Admission: RE | Admit: 2022-03-29 | Discharge: 2022-03-29 | Disposition: A | Discharge status: Home / Self Care | Payer: Medicaid Other | Source: Ambulatory Visit | Attending: Nurse Practitioner | Admitting: Nurse Practitioner

## 2022-03-29 VITALS — BP 148/91 | HR 98 | Temp 98.2°F | Resp 24

## 2022-03-29 DIAGNOSIS — N3001 Acute cystitis with hematuria: Secondary | ICD-10-CM

## 2022-03-29 LAB — POCT URINALYSIS DIP (MANUAL ENTRY)
Bilirubin, UA: NEGATIVE
Glucose, UA: 100 mg/dL — AB
Ketones, POC UA: NEGATIVE mg/dL
Nitrite, UA: NEGATIVE
Spec Grav, UA: 1.015
Urobilinogen, UA: >=8 U/dL — AB
pH, UA: 6

## 2022-03-29 MED ORDER — PHENAZOPYRIDINE HCL 100 MG PO TABS: 100.0000 mg | ORAL_TABLET | Freq: Three times a day (TID) | ORAL | 0 refills | Status: DC | PRN

## 2022-03-29 MED ORDER — NITROFURANTOIN MONOHYD MACRO 100 MG PO CAPS: 100.0000 mg | ORAL_CAPSULE | Freq: Two times a day (BID) | ORAL | 0 refills | Status: AC

## 2022-03-29 NOTE — Discharge Instructions (Signed)
The urinalysis today is suggestive of a UTI.  Please take the Macrobid as prescribed to treat the UTI.  We will call you later this week if we need to change the antibiotic.  In the meantime, make sure you are drinking plenty of water and you can take the Pyridium every 8 hours as needed to help with bladder pain.

## 2022-03-29 NOTE — ED Triage Notes (Signed)
Pt reports increase urinary frequency and burning when urinating x 3 days.

## 2022-03-29 NOTE — ED Provider Notes (Signed)
[facility]    CSN: [id number] Arrival date & time: 03/29/22  1306      History   Chief Complaint Chief Complaint  Patient presents with   Urinary Frequency    Pain when peeing - Entered by patient   Appointment    1400    HPI Chelsey Henson is a 52 y.o. female.   Patient presents today for 2-day history of dysuria, urinary frequency and urgency.  Also has voiding smaller amounts, reports her urinary odor is stronger.  No hematuria, abdominal pain, new back pain or flank pain.  No fevers or nausea/vomiting.  No vaginal discharge.  Reports she does have some pressure above her suprapubic bone.  Patient denies recent sexual activity.  Has not taken anything for symptoms so far.  Reports history of urinary tract infections.  Last one was more than 3 months ago, she does not think this is a recurrent issue.  Patient is COVID she is not pregnant-had hysterectomy.  No vaginal bleeding.    Past Medical History:  Diagnosis Date   Anemia    Anxiety    Asthma    COPD (chronic obstructive pulmonary disease) ([place]) 04/05/2016   Phreesia 05/28/2020   Depression    Depression    Phreesia 05/28/2020   Genital herpes    GERD (gastroesophageal reflux disease)    HIV (human immunodeficiency virus infection) ([place]) 11/24/2017   HIV infection ([place]) 2008   Hypertension    Routine screening for STI (sexually transmitted infection) 01/19/2022   Sleep apnea    Substance abuse ([place])    cocaine- last use was Oct 2009   Vaginal Pap smear, abnormal     Patient Active Problem List   Diagnosis Date Noted   Routine screening for STI (sexually transmitted infection) 01/19/2022   Lipoma of buttock    Rash 07/23/2020   Post traumatic stress disorder (PTSD) 04/30/2019   Anxiety 04/30/2019   Encounter to establish care 10/27/2018   Transaminitis 05/24/2017   Gastroesophageal reflux disease without esophagitis 04/06/2017   MDD (major depressive disorder), recurrent episode, moderate  ([place]) 09/08/2016   Cocaine use disorder, moderate, in sustained remission ([place]) 09/08/2016   OSA (obstructive sleep apnea) 04/15/2016   Asthma 04/15/2016   HIV (human immunodeficiency virus infection) ([place]) 04/05/2016   Essential hypertension 04/05/2016    Past Surgical History:  Procedure Laterality Date   ABDOMINAL HYSTERECTOMY     BENIGN MASS; total hysterectomy   CESAREAN SECTION     ESOPHAGOGASTRODUODENOSCOPY N/A 06/30/2017   Procedure: ESOPHAGOGASTRODUODENOSCOPY (EGD);  Surgeon: Rogene Houston, MD;  Location: [facility];  Service: Endoscopy;  Laterality: N/A;  2:00   HERNIA REPAIR  2017   LIPOMA EXCISION Left 08/14/2021   Procedure: EXCISION LIPOMA, LEFT BUTTOCKS;  Surgeon: Aviva Signs, MD;  Location: [facility];  Service: General;  Laterality: Left;    OB History     Gravida  6   Para  4   Term  4   Preterm      AB  2   Living  4      SAB  2   IAB      Ectopic      Multiple      Live Births               Home Medications    Prior to Admission medications   Medication Sig Start Date End Date Taking? Authorizing Provider  nitrofurantoin, macrocrystal-monohydrate, (MACROBID) 100 MG capsule Take 1 capsule (  100 mg total) by mouth 2 (two) times daily for 5 days. 03/29/22 04/03/22 Yes Eulogio Bear, NP  phenazopyridine (PYRIDIUM) 100 MG tablet Take 1 tablet (100 mg total) by mouth 3 (three) times daily as needed for pain (bladder pain). 03/29/22  Yes Eulogio Bear, NP  acyclovir (ZOVIRAX) 800 MG tablet TAKE (1) TABLET BY MOUTH ONCE DAILY. 07/18/20   Noreene Larsson, NP  albuterol (PROVENTIL HFA;VENTOLIN HFA) 108 (90 Base) MCG/ACT inhaler Inhale 2 puffs into the lungs every 4 (four) hours as needed for wheezing or shortness of breath. 07/09/17   Noemi Chapel, MD  ALPRAZolam Duanne Moron) 0.5 MG tablet Take 0.5 mg by mouth 3 (three) times daily as needed for anxiety. 10/20/20   [provider]  atorvastatin (LIPITOR) 40 MG tablet Take 40 mg by mouth  daily.    [provider]  cabotegravir & rilpivirine ER (CABENUVA) 600 & 900 MG/3ML injection Inject 1 kit into the muscle every 30 (thirty) days. 03/18/22   Esmond Plants, RPH-CPP  cabotegravir & rilpivirine ER (CABENUVA) 600 & 900 MG/3ML injection Inject 1 kit into the muscle every 2 (two) months. 03/18/22   Esmond Plants, RPH-CPP  cyclobenzaprine (FLEXERIL) 10 MG tablet Take 1 tablet (10 mg total) by mouth 2 (two) times daily as needed for muscle spasms. Patient not taking: Reported on 01/19/2022 08/02/21   Marcello Fennel, PA-C  elvitegravir-cobicistat-emtricitabine-tenofovir (GENVOYA) 150-150-200-10 MG TABS tablet Take 1 tablet by mouth daily. 03/18/22   Esmond Plants, RPH-CPP  escitalopram (LEXAPRO) 20 MG tablet TAKE (1) TABLET BY MOUTH ONCE DAILY. 06/12/20   Noreene Larsson, NP  estradiol (ESTRACE) 1 MG tablet Take 1 mg by mouth every evening.     [provider]  HYDROcodone-acetaminophen (NORCO/VICODIN) 5-325 MG tablet Take one tab po q 4 hrs prn pain Patient not taking: Reported on 01/19/2022 12/21/21   Triplett, Tammy, PA-C  lidocaine (LIDODERM) 5 % Place 1 patch onto the skin daily. Remove & Discard patch within 12 hours or as directed by MD Patient not taking: Reported on 01/19/2022 05/07/21   Evalee Jefferson, PA-C  Melatonin 1 MG CAPS Take 2 mg by mouth at bedtime as needed (sleep).    [provider]  naproxen (NAPROSYN) 500 MG tablet Take 1 tablet (500 mg total) by mouth 2 (two) times daily. Patient not taking: Reported on 01/19/2022 09/01/20   Scot Jun, NP  ondansetron (ZOFRAN) 4 MG tablet Take 1 tablet (4 mg total) by mouth every 6 (six) hours. 12/21/21   Triplett, Tammy, PA-C  QUEtiapine (SEROQUEL) 100 MG tablet Take 1 tablet (100 mg total) by mouth at bedtime. Patient not taking: Reported on 01/19/2022 06/06/19   [place] Crane, MD  QUEtiapine (SEROQUEL) 200 MG tablet Take 200 mg by mouth at bedtime. Patient not taking: Reported on 01/19/2022  08/04/21   [provider]  tiZANidine (ZANAFLEX) 2 MG tablet Take 1-2 tablets (2-4 mg total) by mouth 2 (two) times daily as needed for muscle spasms. 04/17/21   Jaynee Eagles, PA-C  traZODone (DESYREL) 100 MG tablet Take 100 mg by mouth at bedtime as needed for sleep. Patient not taking: Reported on 01/19/2022 08/04/21   [provider]  triamcinolone cream (KENALOG) 0.1 % Apply 1 application topically 2 (two) times daily as needed. 09/01/20   Scot Jun, NP  Vibegron (GEMTESA) 75 MG TABS Take 75 mg by mouth daily. 09/07/21   Stoneking, Reece Leader., MD  budesonide-formoterol (SYMBICORT) 160-4.5 MCG/ACT inhaler Inhale  2 puffs into the lungs 2 (two) times daily.  09/25/18  [provider]  montelukast (SINGULAIR) 10 MG tablet Take 1 tablet (10 mg total) by mouth at bedtime. 07/26/17 09/25/18  Comer, Okey Regal, MD    Family History Family History  Problem Relation Age of Onset   Depression Mother    Hyperlipidemia Mother    Learning disabilities Mother    Alcohol abuse Mother 84   Alcohol abuse Father    Arthritis Father    Cancer Father    Heart disease Father    Hypertension Father    Learning disabilities Father    Alcohol abuse Sister        RECOVERED   Alcohol abuse Brother    Alcohol abuse Brother    Alcohol abuse Brother     Social History Social History   Tobacco Use   Smoking status: Never   Smokeless tobacco: Never  Vaping Use   Vaping Use: Never used  Substance Use Topics   Alcohol use: Yes    Alcohol/week: 1.0 standard drink of alcohol    Types: 1 Cans of beer per week    Comment: occ.   Drug use: No    Comment: none in 7 years     Allergies   Abilify [aripiprazole] and Codeine   Review of Systems Review of Systems Per HPI  Physical Exam Triage Vital Signs ED Triage Vitals  Enc Vitals Group     BP 03/29/22 1404 (!) 148/91     Pulse Rate 03/29/22 1404 98     Resp 03/29/22 1404 (!) 24     Temp 03/29/22 1404 98.2 F (36.8 C)      Temp Source 03/29/22 1404 Oral     SpO2 03/29/22 1404 96 %     Weight --      Height --      Head Circumference --      Peak Flow --      Pain Score 03/29/22 1407 0     Pain Loc --      Pain Edu? --      Excl. in [place]? --    No data found.  Updated Vital Signs BP (!) 148/91 (BP Location: Right Arm)   Pulse 98   Temp 98.2 F (36.8 C) (Oral)   Resp (!) 24   LMP 09/30/2014 (Approximate)   SpO2 96%   Visual Acuity Right Eye Distance:   Left Eye Distance:   Bilateral Distance:    Right Eye Near:   Left Eye Near:    Bilateral Near:     Physical Exam Vitals and nursing note reviewed.  Constitutional:      General: She is not in acute distress.    Appearance: She is not toxic-appearing.  Pulmonary:     Effort: Pulmonary effort is normal. No respiratory distress.  Abdominal:     General: Abdomen is flat. Bowel sounds are normal. There is no distension.     Palpations: Abdomen is soft. There is no mass.     Tenderness: There is no abdominal tenderness. There is no right CVA tenderness, left CVA tenderness or guarding.  Skin:    General: Skin is warm and dry.     Coloration: Skin is not jaundiced or pale.     Findings: No erythema.  Neurological:     Mental Status: She is alert and oriented to person, place, and time.     Motor: No weakness.     Gait: Gait  normal.  Psychiatric:        Behavior: Behavior is cooperative.      UC Treatments / Results  Labs (all labs ordered are listed, but only abnormal results are displayed) Labs Reviewed  POCT URINALYSIS DIP (MANUAL ENTRY) - Abnormal; Notable for the following components:      Result Value   Color, UA orange (*)    Glucose, UA =100 (*)    Blood, UA large (*)    Protein Ur, POC trace (*)    Urobilinogen, UA >=8.0 (*)    Leukocytes, UA Moderate (2+) (*)    All other components within normal limits  URINE CULTURE    EKG   Radiology No results found.  Procedures Procedures (including critical care  time)  Medications Ordered in UC Medications - No data to display  Initial Impression / Assessment and Plan / UC Course  I have reviewed the triage vital signs and the nursing notes.  Pertinent labs & imaging results that were available during my care of the patient were reviewed by me and considered in my medical decision making (see chart for details).   Patient is well-appearing, normotensive, afebrile, not tachycardic, and is oxygenating well on room air.    Acute cystitis with hematuria Urinalysis today shows large amount of blood as well as leukocyte Estrace Urine cultures pending In meantime, treat with Macrobid twice daily for 5 days Can start Pyridium for up as needed bladder pain ER and return precautions discussed with patient  The patient was given the opportunity to ask questions.  All questions answered to their satisfaction.  The patient is in agreement to this plan.    Final Clinical Impressions(s) / UC Diagnoses   Final diagnoses:  Acute cystitis with hematuria     Discharge Instructions      The urinalysis today is suggestive of a UTI.  Please take the Macrobid as prescribed to treat the UTI.  We will call you later this week if we need to change the antibiotic.  In the meantime, make sure you are drinking plenty of water and you can take the Pyridium every 8 hours as needed to help with bladder pain.   ED Prescriptions     Medication Sig Dispense Auth. Provider   nitrofurantoin, macrocrystal-monohydrate, (MACROBID) 100 MG capsule Take 1 capsule (100 mg total) by mouth 2 (two) times daily for 5 days. 10 capsule Noemi Chapel A, NP   phenazopyridine (PYRIDIUM) 100 MG tablet Take 1 tablet (100 mg total) by mouth 3 (three) times daily as needed for pain (bladder pain). 10 tablet Eulogio Bear, NP      PDMP not reviewed this encounter.   Eulogio Bear, NP 03/29/22 1511

## 2022-03-31 ENCOUNTER — Ambulatory Visit (INDEPENDENT_AMBULATORY_CARE_PROVIDER_SITE_OTHER): Payer: Medicaid Other | Admitting: Pharmacist

## 2022-03-31 ENCOUNTER — Other Ambulatory Visit: Payer: Self-pay

## 2022-03-31 DIAGNOSIS — B20 Human immunodeficiency virus [HIV] disease: Secondary | ICD-10-CM

## 2022-03-31 LAB — URINE CULTURE: Culture: >=100000 — AB

## 2022-03-31 MED ORDER — CABOTEGRAVIR & RILPIVIRINE ER 600 & 900 MG/3ML IM SUER
1.0000 | Freq: Once | INTRAMUSCULAR | Status: AC
  Administered 2022-03-31: 1 via INTRAMUSCULAR

## 2022-03-31 NOTE — Progress Notes (Signed)
NEW REFERRAL TO CPP CLINIC    HPI: Chelsey Henson is a 52 y.o. female who presents to the [facility] for [place] administration.  Patient Active Problem List   Diagnosis Date Noted   Routine screening for STI (sexually transmitted infection) 01/19/2022   Lipoma of buttock    Rash 07/23/2020   Post traumatic stress disorder (PTSD) 04/30/2019   Anxiety 04/30/2019   Encounter to establish care 10/27/2018   Transaminitis 05/24/2017   Gastroesophageal reflux disease without esophagitis 04/06/2017   MDD (major depressive disorder), recurrent episode, moderate ([place]) 09/08/2016   Cocaine use disorder, moderate, in sustained remission ([place]) 09/08/2016   OSA (obstructive sleep apnea) 04/15/2016   Asthma 04/15/2016   HIV (human immunodeficiency virus infection) ([place]) 04/05/2016   Essential hypertension 04/05/2016    Patient's Medications  New Prescriptions   No medications on file  Previous Medications   ACYCLOVIR (ZOVIRAX) 800 MG TABLET    TAKE (1) TABLET BY MOUTH ONCE DAILY.   ALBUTEROL (PROVENTIL HFA;VENTOLIN HFA) 108 (90 BASE) MCG/ACT INHALER    Inhale 2 puffs into the lungs every 4 (four) hours as needed for wheezing or shortness of breath.   ALPRAZOLAM (XANAX) 0.5 MG TABLET    Take 0.5 mg by mouth 3 (three) times daily as needed for anxiety.   ATORVASTATIN (LIPITOR) 40 MG TABLET    Take 40 mg by mouth daily.   CABOTEGRAVIR & RILPIVIRINE ER (CABENUVA) 600 & 900 MG/3ML INJECTION    Inject 1 kit into the muscle every 30 (thirty) days.   CABOTEGRAVIR & RILPIVIRINE ER (CABENUVA) 600 & 900 MG/3ML INJECTION    Inject 1 kit into the muscle every 2 (two) months.   CYCLOBENZAPRINE (FLEXERIL) 10 MG TABLET    Take 1 tablet (10 mg total) by mouth 2 (two) times daily as needed for muscle spasms.   ELVITEGRAVIR-COBICISTAT-EMTRICITABINE-TENOFOVIR (GENVOYA) 150-150-200-10 MG TABS TABLET    Take 1 tablet by mouth daily.   ESCITALOPRAM (LEXAPRO) 20 MG TABLET    TAKE (1) TABLET BY MOUTH ONCE  DAILY.   ESTRADIOL (ESTRACE) 1 MG TABLET    Take 1 mg by mouth every evening.    HYDROCODONE-ACETAMINOPHEN (NORCO/VICODIN) 5-325 MG TABLET    Take one tab po q 4 hrs prn pain   LIDOCAINE (LIDODERM) 5 %    Place 1 patch onto the skin daily. Remove & Discard patch within 12 hours or as directed by MD   MELATONIN 1 MG CAPS    Take 2 mg by mouth at bedtime as needed (sleep).   NAPROXEN (NAPROSYN) 500 MG TABLET    Take 1 tablet (500 mg total) by mouth 2 (two) times daily.   NITROFURANTOIN, MACROCRYSTAL-MONOHYDRATE, (MACROBID) 100 MG CAPSULE    Take 1 capsule (100 mg total) by mouth 2 (two) times daily for 5 days.   ONDANSETRON (ZOFRAN) 4 MG TABLET    Take 1 tablet (4 mg total) by mouth every 6 (six) hours.   PHENAZOPYRIDINE (PYRIDIUM) 100 MG TABLET    Take 1 tablet (100 mg total) by mouth 3 (three) times daily as needed for pain (bladder pain).   QUETIAPINE (SEROQUEL) 100 MG TABLET    Take 1 tablet (100 mg total) by mouth at bedtime.   QUETIAPINE (SEROQUEL) 200 MG TABLET    Take 200 mg by mouth at bedtime.   TIZANIDINE (ZANAFLEX) 2 MG TABLET    Take 1-2 tablets (2-4 mg total) by mouth 2 (two) times daily as needed for muscle spasms.   TRAZODONE (  DESYREL) 100 MG TABLET    Take 100 mg by mouth at bedtime as needed for sleep.   TRIAMCINOLONE CREAM (KENALOG) 0.1 %    Apply 1 application topically 2 (two) times daily as needed.   VIBEGRON (GEMTESA) 75 MG TABS    Take 75 mg by mouth daily.  Modified Medications   No medications on file  Discontinued Medications   No medications on file    Allergies: Allergies  Allergen Reactions   Abilify [Aripiprazole] Other (See Comments)    Tardive dyskinesia   Codeine Itching    Past Medical History: Past Medical History:  Diagnosis Date   Anemia    Anxiety    Asthma    COPD (chronic obstructive pulmonary disease) ([place]) 04/05/2016   Phreesia 05/28/2020   Depression    Depression    Phreesia 05/28/2020   Genital herpes    GERD (gastroesophageal  reflux disease)    HIV (human immunodeficiency virus infection) ([place]) 11/24/2017   HIV infection ([place]) 2008   Hypertension    Routine screening for STI (sexually transmitted infection) 01/19/2022   Sleep apnea    Substance abuse ([place])    cocaine- last use was Oct 2009   Vaginal Pap smear, abnormal     Social History: Social History   Socioeconomic History   Marital status: Legally Separated    Spouse name: Legrand Como   Number of children: 4   Years of education: 8   Highest education level: Not on file  Occupational History   Occupation: [employer]    Comment: delivery  Tobacco Use   Smoking status: Never   Smokeless tobacco: Never  Vaping Use   Vaping Use: Never used  Substance and Sexual Activity   Alcohol use: Yes    Alcohol/week: 1.0 standard drink of alcohol    Types: 1 Cans of beer per week    Comment: occ.   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 [place] Strain: Not on file  Food Insecurity: Not on file  Transportation Needs: Not on file  Physical Activity: Not on file  Stress: Not on file  Social Connections: Not on file    Labs: Lab Results  Component Value Date   HIV1RNAQUANT Not Detected 01/06/2022   HIV1RNAQUANT Not Detected 02/17/2021   HIV1RNAQUANT 40 (H) 04/26/2018   CD4TABS 703 01/06/2022   CD4TABS 826 02/17/2021   CD4TABS 590 04/13/2018    RPR and STI Lab Results  Component Value Date   LABRPR NON-REACTIVE 01/06/2022   LABRPR NON-REACTIVE 02/17/2021   LABRPR NON-REACTIVE 02/23/2018   LABRPR NON-REACTIVE 05/16/2017   LABRPR NON REAC 05/17/2016    STI Results GC CT   01/06/2022  2:27 PM Negative  Negative   02/17/2021  3:13 PM Negative  Negative   02/23/2018 12:00 AM Negative  Negative   05/16/2017 12:00 AM Negative  Negative   05/17/2016 12:00 AM Negative  Negative     Hepatitis B Lab Results  Component Value Date   HEPBSAB NEG 05/17/2016   HEPBSAG NEGATIVE 05/17/2016   HEPBCAB NON REACTIVE  05/17/2016   Hepatitis C No results found for: "HEPCAB", "HCVRNAPCRQN" Hepatitis A Lab Results  Component Value Date   HAV REACTIVE (A) 05/17/2016   Lipids: Lab Results  Component Value Date   CHOL 222 (H) 01/06/2022   TRIG 130 01/06/2022   HDL 64 01/06/2022   CHOLHDL 3.5 01/06/2022   VLDL 46 (H) 05/17/2016   LDLCALC 133 (H) 01/06/2022    Current HIV Regimen: Genvoya  TARGET DATE: The 31st of the month  Assessment: Hula presents today for their first initiation injection for Cabenuva. Counseled that [place] is two separate intramuscular injections in the gluteal muscle on each side for each visit. Explained that the second injection is 30 days after the initial injection then every 2 months thereafter. Discussed the need for viral load monitoring every 2 months for the first 6 months and then periodically afterwards as their provider sees the need. Discussed the rare but significant chance of developing resistance despite compliance. Explained that showing up to injection appointments is very important and warned that if 2 appointments are missed, it will be reassessed by their provider whether they are a good candidate for injection therapy. Counseled on possible side effects associated with the injections such as injection site pain, which is usually mild to moderate in nature, injection site nodules, and injection site reactions. Asked to call the clinic or send me a mychart message if they experience any issues, such as fatigue, nausea, headache, rash, or dizziness. Advised that they can take ibuprofen or tylenol for injection site pain if  needed.   Administered cabotegravir '600mg'$ /4m in left upper outer quadrant of the gluteal muscle. Administered rilpivirine 900 mg/324min the right upper outer quadrant of the gluteal muscle. Monitored patient for 10 minutes after injection. Injections were tolerated well without issue. Counseled to stop taking Genvoya after today's dose and to call with any issues that may arise. Will make follow up appointments for second initiation injection in 30 days and then maintenance injections every 2 months thereafter for 6 months.   Plan: - Stop Genvoya - First Cabenuva injections administered - Second initiation injection scheduled for 2/27 with me  - Maintenance injections scheduled for 4/24 with me  - Call with any issues or questions  AmAlfonse SprucePharmD, CPP, BCIDP, AAHuetterlinical Pharmacist Practitioner Infectious Diseases [facility]

## 2022-04-09 ENCOUNTER — Other Ambulatory Visit (HOSPITAL_COMMUNITY): Payer: Self-pay

## 2022-04-12 ENCOUNTER — Other Ambulatory Visit: Payer: Self-pay

## 2022-04-13 ENCOUNTER — Telehealth: Payer: Self-pay

## 2022-04-13 NOTE — Telephone Encounter (Signed)
[facility] Patient Advocate Encounter  Patient's medication Kern Reap) have been couriered to [facility] from [place] and will be administered on the patient next office visit on 04/27/22.  Ileene Patrick , [facility] Patient [facility] Phone: [phone] Fax:  [phone]

## 2022-04-27 ENCOUNTER — Other Ambulatory Visit: Payer: Self-pay

## 2022-04-27 ENCOUNTER — Ambulatory Visit (INDEPENDENT_AMBULATORY_CARE_PROVIDER_SITE_OTHER): Payer: Medicaid Other | Admitting: Pharmacist

## 2022-04-27 DIAGNOSIS — Z23 Encounter for immunization: Secondary | ICD-10-CM

## 2022-04-27 DIAGNOSIS — B20 Human immunodeficiency virus [HIV] disease: Secondary | ICD-10-CM

## 2022-04-27 MED ORDER — CABOTEGRAVIR & RILPIVIRINE ER 600 & 900 MG/3ML IM SUER
1.0000 | Freq: Once | INTRAMUSCULAR | Status: AC
  Administered 2022-04-27: 1 via INTRAMUSCULAR

## 2022-04-27 NOTE — Progress Notes (Cosign Needed)
HPI: Chelsey Henson is a 52 y.o. female who presents to the [facility] for [place] administration.  Patient Active Problem List   Diagnosis Date Noted   Routine screening for STI (sexually transmitted infection) 01/19/2022   Lipoma of buttock    Rash 07/23/2020   Post traumatic stress disorder (PTSD) 04/30/2019   Anxiety 04/30/2019   Encounter to establish care 10/27/2018   Transaminitis 05/24/2017   Gastroesophageal reflux disease without esophagitis 04/06/2017   MDD (major depressive disorder), recurrent episode, moderate ([place]) 09/08/2016   Cocaine use disorder, moderate, in sustained remission ([place]) 09/08/2016   OSA (obstructive sleep apnea) 04/15/2016   Asthma 04/15/2016   HIV (human immunodeficiency virus infection) ([place]) 04/05/2016   Essential hypertension 04/05/2016    Patient's Medications  New Prescriptions   No medications on file  Previous Medications   ACYCLOVIR (ZOVIRAX) 800 MG TABLET    TAKE (1) TABLET BY MOUTH ONCE DAILY.   ALBUTEROL (PROVENTIL HFA;VENTOLIN HFA) 108 (90 BASE) MCG/ACT INHALER    Inhale 2 puffs into the lungs every 4 (four) hours as needed for wheezing or shortness of breath.   ALPRAZOLAM (XANAX) 0.5 MG TABLET    Take 0.5 mg by mouth 3 (three) times daily as needed for anxiety.   ATORVASTATIN (LIPITOR) 40 MG TABLET    Take 40 mg by mouth daily.   CABOTEGRAVIR & RILPIVIRINE ER (CABENUVA) 600 & 900 MG/3ML INJECTION    Inject 1 kit into the muscle every 30 (thirty) days.   CABOTEGRAVIR & RILPIVIRINE ER (CABENUVA) 600 & 900 MG/3ML INJECTION    Inject 1 kit into the muscle every 2 (two) months.   CYCLOBENZAPRINE (FLEXERIL) 10 MG TABLET    Take 1 tablet (10 mg total) by mouth 2 (two) times daily as needed for muscle spasms.   ESCITALOPRAM (LEXAPRO) 20 MG TABLET    TAKE (1) TABLET BY MOUTH ONCE DAILY.   ESTRADIOL (ESTRACE) 1 MG TABLET    Take 1 mg by mouth every evening.    HYDROCODONE-ACETAMINOPHEN (NORCO/VICODIN) 5-325 MG TABLET    Take one tab  po q 4 hrs prn pain   LIDOCAINE (LIDODERM) 5 %    Place 1 patch onto the skin daily. Remove & Discard patch within 12 hours or as directed by MD   MELATONIN 1 MG CAPS    Take 2 mg by mouth at bedtime as needed (sleep).   NAPROXEN (NAPROSYN) 500 MG TABLET    Take 1 tablet (500 mg total) by mouth 2 (two) times daily.   ONDANSETRON (ZOFRAN) 4 MG TABLET    Take 1 tablet (4 mg total) by mouth every 6 (six) hours.   PHENAZOPYRIDINE (PYRIDIUM) 100 MG TABLET    Take 1 tablet (100 mg total) by mouth 3 (three) times daily as needed for pain (bladder pain).   QUETIAPINE (SEROQUEL) 100 MG TABLET    Take 1 tablet (100 mg total) by mouth at bedtime.   QUETIAPINE (SEROQUEL) 200 MG TABLET    Take 200 mg by mouth at bedtime.   TIZANIDINE (ZANAFLEX) 2 MG TABLET    Take 1-2 tablets (2-4 mg total) by mouth 2 (two) times daily as needed for muscle spasms.   TRAZODONE (DESYREL) 100 MG TABLET    Take 100 mg by mouth at bedtime as needed for sleep.   TRIAMCINOLONE CREAM (KENALOG) 0.1 %    Apply 1 application topically 2 (two) times daily as needed.   VIBEGRON (GEMTESA) 75 MG TABS    Take 75  mg by mouth daily.  Modified Medications   No medications on file  Discontinued Medications   No medications on file    Allergies: Allergies  Allergen Reactions   Abilify [Aripiprazole] Other (See Comments)    Tardive dyskinesia   Codeine Itching    Past Medical History: Past Medical History:  Diagnosis Date   Anemia    Anxiety    Asthma    COPD (chronic obstructive pulmonary disease) ([place]) 04/05/2016   Phreesia 05/28/2020   Depression    Depression    Phreesia 05/28/2020   Genital herpes    GERD (gastroesophageal reflux disease)    HIV (human immunodeficiency virus infection) ([place]) 11/24/2017   HIV infection ([place]) 2008   Hypertension    Routine screening for STI (sexually transmitted infection) 01/19/2022   Sleep apnea    Substance abuse ([place])    cocaine- last use was Oct 2009   Vaginal Pap smear, abnormal      Social History: Social History   Socioeconomic History   Marital status: Legally Separated    Spouse name: Legrand Como   Number of children: 4   Years of education: 8   Highest education level: Not on file  Occupational History   Occupation: [employer]    Comment: delivery  Tobacco Use   Smoking status: Never   Smokeless tobacco: Never  Vaping Use   Vaping Use: Never used  Substance and Sexual Activity   Alcohol use: Yes    Alcohol/week: 1.0 standard drink of alcohol    Types: 1 Cans of beer per week    Comment: occ.   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 [place] Strain: Not on file  Food Insecurity: Not on file  Transportation Needs: Not on file  Physical Activity: Not on file  Stress: Not on file  Social Connections: Not on file    Labs: Lab Results  Component Value Date   HIV1RNAQUANT Not Detected 01/06/2022   HIV1RNAQUANT Not Detected 02/17/2021   HIV1RNAQUANT 40 (H) 04/26/2018   CD4TABS 703 01/06/2022   CD4TABS 826 02/17/2021   CD4TABS 590 04/13/2018    RPR and STI Lab Results  Component Value Date   LABRPR NON-REACTIVE 01/06/2022   LABRPR NON-REACTIVE 02/17/2021   LABRPR NON-REACTIVE 02/23/2018   LABRPR NON-REACTIVE 05/16/2017   LABRPR NON REAC 05/17/2016    STI Results GC CT  01/06/2022  2:27 PM Negative  Negative   02/17/2021  3:13 PM Negative  Negative   02/23/2018 12:00 AM Negative  Negative   05/16/2017 12:00 AM Negative  Negative   05/17/2016 12:00 AM Negative  Negative     Hepatitis B Lab Results  Component Value Date   HEPBSAB NEG 05/17/2016   HEPBSAG NEGATIVE  05/17/2016   HEPBCAB NON REACTIVE 05/17/2016   Hepatitis C No results found for: "HEPCAB", "HCVRNAPCRQN" Hepatitis A Lab Results  Component Value Date   HAV REACTIVE (A) 05/17/2016   Lipids: Lab Results  Component Value Date   CHOL 222 (H) 01/06/2022   TRIG 130 01/06/2022   HDL 64 01/06/2022  CHOLHDL 3.5 01/06/2022   VLDL 46 (H) 05/17/2016   LDLCALC 133 (H) 01/06/2022    TARGET DATE: 31st  Assessment: Ameshia presents today for 2nd induction Cabenuva injections. She notes that she had diarrhea, low energy and low appetite for about 5 days after the first injection, but otherwise well tolerated. Counseled her to monitor closely for repeat symptoms with this injection. She is not significantly inconvenienced by these symptoms and wants to continue the injections.   She reports that she is not sexually active and declines STI testing. She is also up to date on all vaccines but has not gotten the most recent COVID vaccine. She completed the HBV series in 2018 but a confirmatory HBV antibody was never collected. She politely declines the COVID vaccine today but accepts HBV ab to evaluate immunity.  Administered cabotegravir '600mg'$ /43m in left upper outer quadrant of the gluteal muscle. Administered rilpivirine 900 mg/350min the right upper outer quadrant of the gluteal muscle. No issues with injections. She will follow up in 2 months for next set of injections.  Plan: - HIV RNA, HBV ab collected today - Cabenuva injections administered - Next injections scheduled for 06/23/22 with AmEstill Bamberg Call with any issues or questions  KeTitus DubinPharmD PGY1 Pharmacy Resident 04/27/2022 2:46 PM

## 2022-04-29 ENCOUNTER — Encounter: Payer: Self-pay | Admitting: Radiology

## 2022-04-30 LAB — HEPATITIS B SURFACE ANTIBODY,QUALITATIVE: Hep B S Ab: NONREACTIVE

## 2022-04-30 LAB — HIV-1 RNA QUANT-NO REFLEX-BLD
HIV 1 RNA Quant: NOT DETECTED Copies/mL
HIV-1 RNA Quant, Log: NOT DETECTED Log cps/mL

## 2022-05-02 ENCOUNTER — Emergency Department (HOSPITAL_COMMUNITY): Payer: Medicaid Other

## 2022-05-02 ENCOUNTER — Encounter (HOSPITAL_COMMUNITY): Payer: Self-pay

## 2022-05-02 ENCOUNTER — Other Ambulatory Visit: Payer: Self-pay

## 2022-05-02 ENCOUNTER — Emergency Department (HOSPITAL_COMMUNITY)
Admission: EM | Admit: 2022-05-02 | Discharge: 2022-05-02 | Disposition: A | Discharge status: Home / Self Care | Payer: Medicaid Other | Attending: Emergency Medicine | Admitting: Emergency Medicine

## 2022-05-02 DIAGNOSIS — M791 Myalgia, unspecified site: Secondary | ICD-10-CM

## 2022-05-02 DIAGNOSIS — M7918 Myalgia, other site: Secondary | ICD-10-CM | POA: Diagnosis not present

## 2022-05-02 DIAGNOSIS — Z20822 Contact with and (suspected) exposure to covid-19: Secondary | ICD-10-CM | POA: Diagnosis not present

## 2022-05-02 DIAGNOSIS — R112 Nausea with vomiting, unspecified: Secondary | ICD-10-CM | POA: Diagnosis not present

## 2022-05-02 DIAGNOSIS — A419 Sepsis, unspecified organism: Secondary | ICD-10-CM | POA: Diagnosis not present

## 2022-05-02 DIAGNOSIS — R0789 Other chest pain: Secondary | ICD-10-CM | POA: Diagnosis not present

## 2022-05-02 DIAGNOSIS — R197 Diarrhea, unspecified: Secondary | ICD-10-CM | POA: Diagnosis not present

## 2022-05-02 LAB — COMPREHENSIVE METABOLIC PANEL
ALT: 92 U/L — ABNORMAL HIGH (ref 0–44)
AST: 151 U/L — ABNORMAL HIGH (ref 15–41)
Albumin: 3.3 g/dL — ABNORMAL LOW (ref 3.5–5.0)
Alkaline Phosphatase: 59 U/L (ref 38–126)
Anion gap: 8 (ref 5–15)
BUN: 6 mg/dL (ref 6–20)
CO2: 24 mmol/L (ref 22–32)
Calcium: 8.7 mg/dL — ABNORMAL LOW (ref 8.9–10.3)
Chloride: 104 mmol/L (ref 98–111)
Creatinine, Ser: 0.46 mg/dL (ref 0.44–1.00)
GFR, Estimated: >60 mL/min (ref >60)
Glucose, Bld: 128 mg/dL — ABNORMAL HIGH (ref 70–99)
Potassium: 3.1 mmol/L — ABNORMAL LOW (ref 3.5–5.1)
Sodium: 136 mmol/L (ref 135–145)
Total Bilirubin: 1.5 mg/dL — ABNORMAL HIGH (ref 0.3–1.2)
Total Protein: 7 g/dL (ref 6.5–8.1)

## 2022-05-02 LAB — CBC WITH DIFFERENTIAL/PLATELET
Abs Immature Granulocytes: 0.03 10*3/uL (ref 0.00–0.07)
Basophils Absolute: 0.1 10*3/uL (ref 0.0–0.1)
Basophils Relative: 1 %
Eosinophils Absolute: 0.3 10*3/uL (ref 0.0–0.5)
Eosinophils Relative: 3 %
HCT: 42.6 % (ref 36.0–46.0)
Hemoglobin: 14.4 g/dL (ref 12.0–15.0)
Immature Granulocytes: 0 %
Lymphocytes Relative: 23 %
Lymphs Abs: 1.9 10*3/uL (ref 0.7–4.0)
MCH: 31.6 pg (ref 26.0–34.0)
MCHC: 33.8 g/dL (ref 30.0–36.0)
MCV: 93.4 fL (ref 80.0–100.0)
Monocytes Absolute: 0.8 10*3/uL (ref 0.1–1.0)
Monocytes Relative: 10 %
Neutro Abs: 5.2 10*3/uL (ref 1.7–7.7)
Neutrophils Relative %: 63 %
Platelets: 243 10*3/uL (ref 150–400)
RBC: 4.56 MIL/uL (ref 3.87–5.11)
RDW: 12.6 % (ref 11.5–15.5)
WBC: 8.3 10*3/uL (ref 4.0–10.5)
nRBC: 0 % (ref 0.0–0.2)

## 2022-05-02 LAB — RESP PANEL BY RT-PCR (RSV, FLU A&B, COVID)  RVPGX2
Influenza A by PCR: NEGATIVE
Influenza B by PCR: NEGATIVE
Resp Syncytial Virus by PCR: NEGATIVE
SARS Coronavirus 2 by RT PCR: NEGATIVE

## 2022-05-02 LAB — MAGNESIUM: Magnesium: 1.9 mg/dL (ref 1.7–2.4)

## 2022-05-02 LAB — URINALYSIS, ROUTINE W REFLEX MICROSCOPIC
Bilirubin Urine: NEGATIVE
Glucose, UA: NEGATIVE mg/dL
Hgb urine dipstick: NEGATIVE
Ketones, ur: NEGATIVE mg/dL
Leukocytes,Ua: NEGATIVE
Nitrite: NEGATIVE
Protein, ur: 30 mg/dL — AB
Specific Gravity, Urine: 1.018 (ref 1.005–1.030)
pH: 5 (ref 5.0–8.0)

## 2022-05-02 LAB — LACTIC ACID, PLASMA: Lactic Acid, Venous: 1.6 mmol/L (ref 0.5–1.9)

## 2022-05-02 LAB — CK: Total CK: 62 U/L (ref 38–234)

## 2022-05-02 NOTE — ED Notes (Signed)
Pt reports she was prescribed Xanax for 2 years and Pt reports she went "cold [place]" and stopped taking her Xanax 3 weeks ago.

## 2022-05-02 NOTE — ED Triage Notes (Signed)
Pt reports she is supposed to get this medication every 2 months now after finishing her round of starter dose.

## 2022-05-02 NOTE — ED Triage Notes (Signed)
Pt arrived via POV from home c/o generalized body aches, nausea, diarrhea, loss of appetite and metallic tastes in her mouth following recent round of HIV-suppression injections through a "specialized pharmacy in [place]."

## 2022-05-02 NOTE — Discharge Instructions (Signed)
I did not find a serious cause for your symptoms today.  It may be related to the medications you are getting.  Please call your HIV doctor tomorrow to discuss this.

## 2022-05-02 NOTE — ED Provider Notes (Signed)
[facility] Provider Note   CSN: [id number] Arrival date & time: 05/02/22  [id number]     History  Chief Complaint  Patient presents with   Generalized Body Aches    Chelsey Henson is a 52 y.o. female.  Patient presents to the emergency department with multiple complaints.  Patient reports that she has had nausea, vomiting, diarrhea, generalized aches and pains.  She reports that she recently started a new injection for HIV and gets similar symptoms after each injection.  She has not told her infectious disease doctor this.       Home Medications Prior to Admission medications   Medication Sig Start Date End Date Taking? Authorizing Provider  ALPRAZolam Duanne Moron) 0.5 MG tablet Take 0.5 mg by mouth 3 (three) times daily as needed for anxiety. 10/20/20  Yes [provider]  cabotegravir & rilpivirine ER (CABENUVA) 600 & 900 MG/3ML injection Inject 1 kit into the muscle every 30 (thirty) days. 03/18/22  Yes Esmond Plants, RPH-CPP  acyclovir (ZOVIRAX) 800 MG tablet TAKE (1) TABLET BY MOUTH ONCE DAILY. 07/18/20   Noreene Larsson, NP  acyclovir (ZOVIRAX) 800 MG tablet 1 tablet Orally Twice a day    [provider]  albuterol (PROVENTIL HFA;VENTOLIN HFA) 108 (90 Base) MCG/ACT inhaler Inhale 2 puffs into the lungs every 4 (four) hours as needed for wheezing or shortness of breath. 07/09/17   Noemi Chapel, MD  atorvastatin (LIPITOR) 40 MG tablet Take 40 mg by mouth daily.    [provider]  atorvastatin (LIPITOR) 40 MG tablet 1 tablet Orally Once a day for 30 day(s)    [provider]  buPROPion (WELLBUTRIN XL) 150 MG 24 hr tablet 1 tablet in the morning Orally Once a day    [provider]  busPIRone (BUSPAR) 15 MG tablet 1 tablet Orally Twice a day    [provider]  cabotegravir & rilpivirine ER (CABENUVA) 600 & 900 MG/3ML injection Inject 1 kit into the muscle every 2 (two) months. 03/18/22   Esmond Plants, RPH-CPP  cyclobenzaprine (FLEXERIL) 10 MG tablet Take 1 tablet (10 mg total) by mouth 2 (two) times daily as needed for muscle spasms. Patient not taking: Reported on 01/19/2022 08/02/21   Marcello Fennel, PA-C  dexlansoprazole (DEXILANT) 60 MG capsule 1 capsule Orally Once a day for 30 day(s)    [provider]  escitalopram (LEXAPRO) 20 MG tablet TAKE (1) TABLET BY MOUTH ONCE DAILY. 06/12/20   Noreene Larsson, NP  escitalopram (LEXAPRO) 20 MG tablet 1 tablet Orally Once a day    [provider]  estradiol (ESTRACE) 1 MG tablet Take 1 mg by mouth every evening.     [provider]  HYDROcodone-acetaminophen (NORCO/VICODIN) 5-325 MG tablet Take one tab po q 4 hrs prn pain Patient not taking: Reported on 01/19/2022 12/21/21   Triplett, Tammy, PA-C  lidocaine (LIDODERM) 5 % Place 1 patch onto the skin daily. Remove & Discard patch within 12 hours or as directed by MD Patient not taking: Reported on 01/19/2022 05/07/21   Evalee Jefferson, PA-C  Melatonin 1 MG CAPS Take 2 mg by mouth at bedtime as needed (sleep).    [provider]  naproxen (NAPROSYN) 500 MG tablet Take 1 tablet (500 mg total) by mouth 2 (two) times daily. Patient not taking: Reported on 01/19/2022 09/01/20   Scot Jun, NP  omeprazole (PRILOSEC) 20 MG capsule Take 20 mg by mouth  daily. 02/02/22   [provider]  ondansetron (ZOFRAN) 4 MG tablet Take 1 tablet (4 mg total) by mouth every 6 (six) hours. 12/21/21   Triplett, Tammy, PA-C  phenazopyridine (PYRIDIUM) 100 MG tablet Take 1 tablet (100 mg total) by mouth 3 (three) times daily as needed for pain (bladder pain). 03/29/22   Eulogio Bear, NP  propranolol (INDERAL) 10 MG tablet 1 tablet Orally three times a day    [provider]  QUEtiapine (SEROQUEL) 100 MG tablet Take 1 tablet (100 mg total) by mouth at bedtime. Patient not taking: Reported on 01/19/2022 06/06/19   Nevada Crane, MD  QUEtiapine (SEROQUEL) 100  MG tablet 1 tablet at bedtime Orally Once a day for 30 day(s)    [provider]  QUEtiapine (SEROQUEL) 200 MG tablet Take 200 mg by mouth at bedtime. Patient not taking: Reported on 01/19/2022 08/04/21   [provider]  tiZANidine (ZANAFLEX) 2 MG tablet Take 1-2 tablets (2-4 mg total) by mouth 2 (two) times daily as needed for muscle spasms. 04/17/21   Jaynee Eagles, PA-C  traZODone (DESYREL) 100 MG tablet Take 100 mg by mouth at bedtime as needed for sleep. Patient not taking: Reported on 01/19/2022 08/04/21   [provider]  triamcinolone cream (KENALOG) 0.1 % Apply 1 application topically 2 (two) times daily as needed. 09/01/20   Scot Jun, NP  Vibegron (GEMTESA) 75 MG TABS Take 75 mg by mouth daily. 09/07/21   Stoneking, Reece Leader., MD  budesonide-formoterol (SYMBICORT) 160-4.5 MCG/ACT inhaler Inhale 2 puffs into the lungs 2 (two) times daily.  09/25/18  [provider]  montelukast (SINGULAIR) 10 MG tablet Take 1 tablet (10 mg total) by mouth at bedtime. 07/26/17 09/25/18  Thayer Headings, MD      Allergies    Aripiprazole and Codeine    Review of Systems   Review of Systems  Physical Exam Updated Vital Signs BP (!) 148/96   Pulse 90   Temp 98 F (36.7 C)   Resp 15   Ht '5\' 4"'$  (1.626 m)   Wt 108.9 kg   LMP 09/30/2014 (Approximate)   SpO2 97%   BMI 41.20 kg/m  Physical Exam Vitals and nursing note reviewed.  Constitutional:      General: She is not in acute distress.    Appearance: She is well-developed. She is obese.  HENT:     Head: Normocephalic and atraumatic.     Mouth/Throat:     Mouth: Mucous membranes are moist.  Eyes:     General: Vision grossly intact. Gaze aligned appropriately.     Extraocular Movements: Extraocular movements intact.     Conjunctiva/sclera: Conjunctivae normal.  Cardiovascular:     Rate and Rhythm: Normal rate and regular rhythm.     Pulses: Normal pulses.     Heart sounds: Normal heart sounds, S1 normal  and S2 normal. No murmur heard.    No friction rub. No gallop.  Pulmonary:     Effort: Pulmonary effort is normal. No respiratory distress.     Breath sounds: Normal breath sounds.  Abdominal:     General: Bowel sounds are normal.     Palpations: Abdomen is soft.     Tenderness: There is no abdominal tenderness. There is no guarding or rebound.     Hernia: No hernia is present.  Musculoskeletal:        General: No swelling.     Cervical back: Full passive range of motion without pain,  normal range of motion and neck supple. No spinous process tenderness or muscular tenderness. Normal range of motion.     Right lower leg: No edema.     Left lower leg: No edema.  Skin:    General: Skin is warm and dry.     Capillary Refill: Capillary refill takes less than 2 seconds.     Findings: No ecchymosis, erythema, rash or wound.  Neurological:     General: No focal deficit present.     Mental Status: She is alert and oriented to person, place, and time.     GCS: GCS eye subscore is 4. GCS verbal subscore is 5. GCS motor subscore is 6.     Cranial Nerves: Cranial nerves 2-12 are intact.     Sensory: Sensation is intact.     Motor: Motor function is intact.     Coordination: Coordination is intact.  Psychiatric:        Attention and Perception: Attention normal.        Mood and Affect: Mood normal.        Speech: Speech normal.        Behavior: Behavior normal.     ED Results / Procedures / Treatments   Labs (all labs ordered are listed, but only abnormal results are displayed) Labs Reviewed  COMPREHENSIVE METABOLIC PANEL - Abnormal; Notable for the following components:      Result Value   Potassium 3.1 (*)    Glucose, Bld 128 (*)    Calcium 8.7 (*)    Albumin 3.3 (*)    AST 151 (*)    ALT 92 (*)    Total Bilirubin 1.5 (*)    All other components within normal limits  URINALYSIS, ROUTINE W REFLEX MICROSCOPIC - Abnormal; Notable for the following components:   Color, Urine AMBER  (*)    APPearance HAZY (*)    Protein, ur 30 (*)    Bacteria, UA RARE (*)    All other components within normal limits  RESP PANEL BY RT-PCR (RSV, FLU A&B, COVID)  [id number]  CBC WITH DIFFERENTIAL/PLATELET  CK  MAGNESIUM  LACTIC ACID, PLASMA    EKG EKG Interpretation  Date/Time:  Sunday May 02 2022 03:44:18 EST Ventricular Rate:  95 PR Interval:  160 QRS Duration: 83 QT Interval:  386 QTC Calculation: 486 R Axis:   42 Text Interpretation: Sinus rhythm Low voltage, precordial leads Borderline abnrm T, anterolateral leads Minimal ST elevation, inferior leads Borderline prolonged QT interval Baseline wander in lead(s) II III aVF V1 V2 V3 V4 V5 V6 Confirmed by Orpah Greek [id number]) on 05/02/2022 3:48:03 AM  Radiology DG Chest Port 1 View  Result Date: 05/02/2022 CLINICAL DATA:  Sepsis workup. EXAM: PORTABLE CHEST 1 VIEW COMPARISON:  06/25/2018 FINDINGS: The lungs are clear without focal pneumonia, edema, pneumothorax or pleural effusion. Interstitial markings are diffusely coarsened with chronic features. The cardio pericardial silhouette is enlarged. The visualized bony structures of the thorax are unremarkable. Telemetry leads overlie the chest. IMPRESSION: Chronic interstitial coarsening without acute cardiopulmonary findings. Electronically Signed   By: Misty Stanley M.D.   On: 05/02/2022 05:01    Procedures Procedures    Medications Ordered in ED Medications - No data to display  ED Course/ Medical Decision Making/ A&P                             Medical Decision Making Amount and/or Complexity of Data Reviewed  Labs: ordered. Radiology: ordered.   Patient presents to the emergency department for evaluation of feeling weak, having generalized aches and pains.  Patient reports that symptoms have been occurring when she gets her HIV injections.  She has not contacted her infectious disease doctor to inquire about this.  Many different causes were considered today.   Specifically infection, sepsis, rhabdomyolysis, adverse medication reaction, withdrawal were considered.  Examination is nonfocal.  She complains of muscle aches as well as multiple joint pain.  No swelling, erythema, warmth or decreased range of motion of the joints.  Doubt septic arthritis.  Lab work is reassuring.  No leukocytosis.  She is afebrile.  Lactic acid normal.  Doubt infection of any kind.  Chest x-ray clear, urinalysis normal.  Patient has a normal CPK, no evidence of rhabdomyolysis.  She reports that she quit Xanax "cold [place]" 3 weeks ago.  It seems unlikely that she is having any problems with withdrawal this far out.  There has not been anything that seems like seizure.        Final Clinical Impression(s) / ED Diagnoses Final diagnoses:  Myalgia    Rx / DC Orders ED Discharge Orders     None         Tallon Gertz, Gwenyth Allegra, MD 05/02/22 [id number]

## 2022-05-03 ENCOUNTER — Telehealth: Payer: Self-pay | Admitting: Internal Medicine

## 2022-05-03 NOTE — Telephone Encounter (Signed)
[facility] called to schedule the next available appointment with Dr. Linus Salmons regarding Chelsey Henson. She stated her life has turned upside down since the start of injections and would like to discuss her concerns. She is scheduled for 3/13.

## 2022-05-05 ENCOUNTER — Telehealth: Payer: Self-pay | Admitting: Licensed Clinical Social Worker

## 2022-05-05 NOTE — Transitions of Care (Post Inpatient/ED Visit) (Signed)
   05/05/2022  Name: Chelsey Henson MRN: [id number] DOB: 09/25/1970  Today's TOC FU Call Status:    Transition Care Management Follow-up Telephone Call Date of Discharge: 05/02/22 Discharge Facility: [facility] ([facility]) How have you been since you were released from the hospital?: Better Any questions or concerns?: No  Items Reviewed: Did you receive and understand the discharge instructions provided?: Yes Medications obtained and verified?: Yes (Medications Reviewed) Any new allergies since your discharge?: No Dietary orders reviewed?: No Do you have support at home?: Yes  Home Care and Equipment/Supplies: [facility] Ordered?: No Any new equipment or medical supplies ordered?: No  Functional Questionnaire: Do you need assistance with bathing/showering or dressing?: No Do you need assistance with meal preparation?: No Do you need assistance with eating?: No Do you have difficulty maintaining continence: No Do you need assistance with getting out of bed/getting out of a chair/moving?: No Do you have difficulty managing or taking your medications?: No  Folllow up appointments reviewed: PCP Follow-up appointment confirmed?: No MD Provider Line Number:[phone] Given: Yes  Hospital Follow-up appointment confirmed?: Yes Date of Specialist follow-up appointment?: 05/12/22 Do you need transportation to your follow-up appointment?: No Do you understand care options if your condition(s) worsen?: Yes-patient verbalized understanding   Eula Fried, BSW, MSW, [employer] Managed Medicaid [email or web address] Phone: [phone]

## 2022-05-06 ENCOUNTER — Other Ambulatory Visit: Payer: Self-pay

## 2022-05-06 ENCOUNTER — Emergency Department (HOSPITAL_COMMUNITY)
Admission: EM | Admit: 2022-05-06 | Discharge: 2022-05-06 | Disposition: A | Discharge status: Home / Self Care | Payer: Medicaid Other | Attending: Emergency Medicine | Admitting: Emergency Medicine

## 2022-05-06 ENCOUNTER — Encounter (HOSPITAL_COMMUNITY): Payer: Self-pay | Admitting: Emergency Medicine

## 2022-05-06 DIAGNOSIS — F329 Major depressive disorder, single episode, unspecified: Secondary | ICD-10-CM | POA: Diagnosis present

## 2022-05-06 DIAGNOSIS — F600 Paranoid personality disorder: Secondary | ICD-10-CM | POA: Insufficient documentation

## 2022-05-06 DIAGNOSIS — F411 Generalized anxiety disorder: Secondary | ICD-10-CM | POA: Diagnosis not present

## 2022-05-06 DIAGNOSIS — R000 Tachycardia, unspecified: Secondary | ICD-10-CM | POA: Diagnosis not present

## 2022-05-06 DIAGNOSIS — R002 Palpitations: Secondary | ICD-10-CM | POA: Insufficient documentation

## 2022-05-06 DIAGNOSIS — Z01818 Encounter for other preprocedural examination: Secondary | ICD-10-CM | POA: Diagnosis not present

## 2022-05-06 DIAGNOSIS — R45851 Suicidal ideations: Secondary | ICD-10-CM | POA: Insufficient documentation

## 2022-05-06 DIAGNOSIS — R079 Chest pain, unspecified: Secondary | ICD-10-CM | POA: Diagnosis not present

## 2022-05-06 DIAGNOSIS — F419 Anxiety disorder, unspecified: Secondary | ICD-10-CM | POA: Diagnosis not present

## 2022-05-06 DIAGNOSIS — F1310 Sedative, hypnotic or anxiolytic abuse, uncomplicated: Secondary | ICD-10-CM

## 2022-05-06 LAB — CBC WITH DIFFERENTIAL/PLATELET
Abs Immature Granulocytes: 0.02 10*3/uL (ref 0.00–0.07)
Basophils Absolute: 0.1 10*3/uL (ref 0.0–0.1)
Basophils Relative: 1 %
Eosinophils Absolute: 0.1 10*3/uL (ref 0.0–0.5)
Eosinophils Relative: 1 %
HCT: 43.2 % (ref 36.0–46.0)
Hemoglobin: 14.7 g/dL (ref 12.0–15.0)
Immature Granulocytes: 0 %
Lymphocytes Relative: 23 %
Lymphs Abs: 1.9 10*3/uL (ref 0.7–4.0)
MCH: 31.3 pg (ref 26.0–34.0)
MCHC: 34 g/dL (ref 30.0–36.0)
MCV: 92.1 fL (ref 80.0–100.0)
Monocytes Absolute: 0.7 10*3/uL (ref 0.1–1.0)
Monocytes Relative: 8 %
Neutro Abs: 5.5 10*3/uL (ref 1.7–7.7)
Neutrophils Relative %: 67 %
Platelets: 178 10*3/uL (ref 150–400)
RBC: 4.69 MIL/uL (ref 3.87–5.11)
RDW: 12.8 % (ref 11.5–15.5)
WBC: 8.3 10*3/uL (ref 4.0–10.5)
nRBC: 0 % (ref 0.0–0.2)

## 2022-05-06 LAB — ETHANOL: Alcohol, Ethyl (B): <10 mg/dL (ref <10)

## 2022-05-06 LAB — COMPREHENSIVE METABOLIC PANEL
ALT: 101 U/L — ABNORMAL HIGH (ref 0–44)
AST: 180 U/L — ABNORMAL HIGH (ref 15–41)
Albumin: 3.8 g/dL (ref 3.5–5.0)
Alkaline Phosphatase: 61 U/L (ref 38–126)
Anion gap: 13 (ref 5–15)
BUN: 11 mg/dL (ref 6–20)
CO2: 19 mmol/L — ABNORMAL LOW (ref 22–32)
Calcium: 9.2 mg/dL (ref 8.9–10.3)
Chloride: 106 mmol/L (ref 98–111)
Creatinine, Ser: 0.62 mg/dL (ref 0.44–1.00)
GFR, Estimated: >60 mL/min (ref >60)
Glucose, Bld: 140 mg/dL — ABNORMAL HIGH (ref 70–99)
Potassium: 3 mmol/L — ABNORMAL LOW (ref 3.5–5.1)
Sodium: 138 mmol/L (ref 135–145)
Total Bilirubin: 2.3 mg/dL — ABNORMAL HIGH (ref 0.3–1.2)
Total Protein: 7.9 g/dL (ref 6.5–8.1)

## 2022-05-06 LAB — RAPID URINE DRUG SCREEN, HOSP PERFORMED
Amphetamines: NOT DETECTED
Barbiturates: NOT DETECTED
Benzodiazepines: POSITIVE — AB
Cocaine: NOT DETECTED
Opiates: NOT DETECTED
Tetrahydrocannabinol: NOT DETECTED

## 2022-05-06 LAB — SALICYLATE LEVEL: Salicylate Lvl: <7 mg/dL — ABNORMAL LOW (ref 7.0–30.0)

## 2022-05-06 LAB — ACETAMINOPHEN LEVEL: Acetaminophen (Tylenol), Serum: <10 ug/mL — ABNORMAL LOW (ref 10–30)

## 2022-05-06 LAB — TROPONIN I (HIGH SENSITIVITY)
Troponin I (High Sensitivity): 3 ng/L (ref <18)
Troponin I (High Sensitivity): 3 ng/L (ref <18)

## 2022-05-06 MED ORDER — ALBUTEROL SULFATE (2.5 MG/3ML) 0.083% IN NEBU: 3.0000 mL | INHALATION_SOLUTION | RESPIRATORY_TRACT | Status: DC | PRN

## 2022-05-06 MED ORDER — LACTATED RINGERS IV BOLUS
1000.0000 mL | Freq: Once | INTRAVENOUS | Status: AC
  Administered 2022-05-06: 1000 mL via INTRAVENOUS

## 2022-05-06 MED ORDER — ACYCLOVIR 800 MG PO TABS
800.0000 mg | ORAL_TABLET | Freq: Three times a day (TID) | ORAL | Status: DC
  Administered 2022-05-06: 800 mg via ORAL
  Filled 2022-05-06: qty 1

## 2022-05-06 MED ORDER — BUPROPION HCL ER (XL) 150 MG PO TB24
300.0000 mg | ORAL_TABLET | Freq: Every day | ORAL | Status: DC
  Administered 2022-05-06: 300 mg via ORAL
  Filled 2022-05-06: qty 2

## 2022-05-06 MED ORDER — PANTOPRAZOLE SODIUM 40 MG PO TBEC
40.0000 mg | DELAYED_RELEASE_TABLET | Freq: Every day | ORAL | Status: DC
  Administered 2022-05-06: 40 mg via ORAL
  Filled 2022-05-06: qty 1

## 2022-05-06 MED ORDER — ONDANSETRON HCL 4 MG PO TABS
4.0000 mg | ORAL_TABLET | Freq: Four times a day (QID) | ORAL | Status: DC
  Administered 2022-05-06: 4 mg via ORAL
  Filled 2022-05-06: qty 1

## 2022-05-06 MED ORDER — PANTOPRAZOLE SODIUM 40 MG PO TBEC: 40.0000 mg | DELAYED_RELEASE_TABLET | Freq: Every day | ORAL | Status: DC

## 2022-05-06 MED ORDER — ESTRADIOL 0.5 MG PO TABS
1.0000 mg | ORAL_TABLET | Freq: Every evening | ORAL | Status: DC
  Filled 2022-05-06: qty 1

## 2022-05-06 MED ORDER — POTASSIUM CHLORIDE CRYS ER 20 MEQ PO TBCR
80.0000 meq | EXTENDED_RELEASE_TABLET | Freq: Once | ORAL | Status: AC
  Administered 2022-05-06: 80 meq via ORAL
  Filled 2022-05-06: qty 4

## 2022-05-06 MED ORDER — MIRABEGRON ER 25 MG PO TB24
25.0000 mg | ORAL_TABLET | Freq: Every day | ORAL | Status: DC
  Administered 2022-05-06: 25 mg via ORAL
  Filled 2022-05-06: qty 1

## 2022-05-06 MED ORDER — ESCITALOPRAM OXALATE 10 MG PO TABS
20.0000 mg | ORAL_TABLET | Freq: Every day | ORAL | Status: DC
  Administered 2022-05-06: 20 mg via ORAL
  Filled 2022-05-06: qty 2

## 2022-05-06 MED ORDER — CHLORDIAZEPOXIDE HCL 25 MG PO CAPS
25.0000 mg | ORAL_CAPSULE | Freq: Once | ORAL | Status: AC
  Administered 2022-05-06: 25 mg via ORAL
  Filled 2022-05-06: qty 1

## 2022-05-06 MED ORDER — ATORVASTATIN CALCIUM 40 MG PO TABS
40.0000 mg | ORAL_TABLET | Freq: Every day | ORAL | Status: DC
  Administered 2022-05-06: 40 mg via ORAL
  Filled 2022-05-06: qty 1

## 2022-05-06 MED ORDER — ALPRAZOLAM 0.5 MG PO TABS: 0.5000 mg | ORAL_TABLET | Freq: Three times a day (TID) | ORAL | Status: DC | PRN

## 2022-05-06 MED ORDER — MELATONIN 3 MG PO TABS: 3.0000 mg | ORAL_TABLET | Freq: Every evening | ORAL | Status: DC | PRN

## 2022-05-06 MED ORDER — MIDAZOLAM HCL 2 MG/2ML IJ SOLN
2.0000 mg | Freq: Once | INTRAMUSCULAR | Status: AC
  Administered 2022-05-06: 2 mg via INTRAVENOUS
  Filled 2022-05-06: qty 2

## 2022-05-06 MED ORDER — POTASSIUM CHLORIDE CRYS ER 20 MEQ PO TBCR
40.0000 meq | EXTENDED_RELEASE_TABLET | Freq: Two times a day (BID) | ORAL | Status: DC
  Administered 2022-05-06: 40 meq via ORAL
  Filled 2022-05-06: qty 2

## 2022-05-06 MED ORDER — PROPRANOLOL HCL 10 MG PO TABS
10.0000 mg | ORAL_TABLET | Freq: Three times a day (TID) | ORAL | Status: DC
  Administered 2022-05-06: 10 mg via ORAL
  Filled 2022-05-06: qty 1

## 2022-05-06 MED ORDER — ALBUTEROL SULFATE HFA 108 (90 BASE) MCG/ACT IN AERS: 2.0000 | INHALATION_SPRAY | RESPIRATORY_TRACT | Status: DC | PRN

## 2022-05-06 NOTE — ED Notes (Signed)
Pt's husband called earlier for an update on pt. No information was given to him. Pt was sleeping at the time. Pt is now awake and she was informed of husband calling for an update. She requested no information be given to him. His name is Chelsey Henson. Pt did report an update or other information could be given to her son, who is listed on the contact information in her chart.

## 2022-05-06 NOTE — ED Notes (Signed)
Patient had small episode of incontinence of bowel. Patient states that she passed gas and accidentally pooped on herself. Patient supplied with materials to clean herself.

## 2022-05-06 NOTE — ED Notes (Signed)
[facility] at bedside to speak with pt.

## 2022-05-06 NOTE — ED Notes (Signed)
Pt states "legs feel jittery." Pt states this happened over a week ago and states "it may be my nerves." Pt informed she had some medication due this nurse would go get them and see if that helps. Pt assisted back into the bed and hooked back up to the cardiac monitor.

## 2022-05-06 NOTE — ED Notes (Addendum)
Addendum

## 2022-05-06 NOTE — ED Notes (Signed)
Pt left with [facility] officer at discharge.  Pt ambulated out with steady gait.  Pt verbalized understanding her discharge instructions and waiting on return call from [facility] crisis line.

## 2022-05-06 NOTE — Progress Notes (Signed)
Patient's next dose of Cabenuva due 4/24. No further pharmacy needs at this time, will sign off and follow peripherally.   Erin Hearing PharmD., BCPS Clinical Pharmacist 05/06/2022 11:07 AM

## 2022-05-06 NOTE — ED Notes (Addendum)
TTS assessment in progress. 

## 2022-05-06 NOTE — Discharge Instructions (Signed)
Please follow-up with the behavioral health [facility] soon as possible as well as [facility] regarding your substance use.  Return to the emergency department if you experience any concerning symptoms.

## 2022-05-06 NOTE — ED Triage Notes (Addendum)
Pt BIB [facility] tonight due to pt requesting psych eval and drug test. Pt states her husband has been playing mind games with her. He brought home drugs from [place] and told her to snort some. Pt states afterwards she began to have some chest pain.   Pt states she has also stopped taking her xanax.  Pt denies SI/HI.
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
## 2022-05-06 NOTE — BH Assessment (Signed)
@  0856, requested patient's nurse (Deanna F, RN) to place the TTS machine in patient's room for her initial TTS assessment.
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
## 2022-05-06 NOTE — ED Notes (Signed)
Corey Skains, SW speaking with pt currently
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
## 2022-05-06 NOTE — ED Notes (Signed)
Pt requesting an officer to escort her at home.   [employer] asked to arrange this.

## 2022-05-06 NOTE — ED Notes (Signed)
TTS at bedside. 
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
## 2022-05-07 ENCOUNTER — Ambulatory Visit (HOSPITAL_COMMUNITY)
Admission: EM | Admit: 2022-05-07 | Discharge: 2022-05-07 | Disposition: A | Discharge status: Other Acute Inpatient Hospital | Payer: Medicaid Other | Attending: Psychiatry | Admitting: Psychiatry

## 2022-05-07 DIAGNOSIS — R45851 Suicidal ideations: Secondary | ICD-10-CM | POA: Insufficient documentation

## 2022-05-07 DIAGNOSIS — Z62812 Personal history of neglect in childhood: Secondary | ICD-10-CM | POA: Diagnosis not present

## 2022-05-07 DIAGNOSIS — Z1152 Encounter for screening for COVID-19: Secondary | ICD-10-CM | POA: Diagnosis not present

## 2022-05-07 DIAGNOSIS — R300 Dysuria: Secondary | ICD-10-CM | POA: Diagnosis not present

## 2022-05-07 DIAGNOSIS — B20 Human immunodeficiency virus [HIV] disease: Secondary | ICD-10-CM | POA: Diagnosis not present

## 2022-05-07 DIAGNOSIS — F419 Anxiety disorder, unspecified: Secondary | ICD-10-CM | POA: Insufficient documentation

## 2022-05-07 DIAGNOSIS — Z91410 Personal history of adult physical and sexual abuse: Secondary | ICD-10-CM | POA: Diagnosis not present

## 2022-05-07 DIAGNOSIS — F32A Depression, unspecified: Secondary | ICD-10-CM | POA: Insufficient documentation

## 2022-05-07 DIAGNOSIS — Z818 Family history of other mental and behavioral disorders: Secondary | ICD-10-CM | POA: Insufficient documentation

## 2022-05-07 DIAGNOSIS — F411 Generalized anxiety disorder: Secondary | ICD-10-CM | POA: Diagnosis not present

## 2022-05-07 DIAGNOSIS — Z79899 Other long term (current) drug therapy: Secondary | ICD-10-CM | POA: Diagnosis not present

## 2022-05-07 DIAGNOSIS — K219 Gastro-esophageal reflux disease without esophagitis: Secondary | ICD-10-CM | POA: Diagnosis not present

## 2022-05-07 DIAGNOSIS — J449 Chronic obstructive pulmonary disease, unspecified: Secondary | ICD-10-CM | POA: Diagnosis not present

## 2022-05-07 DIAGNOSIS — F410 Panic disorder [episodic paroxysmal anxiety] without agoraphobia: Secondary | ICD-10-CM | POA: Insufficient documentation

## 2022-05-07 DIAGNOSIS — Z79624 Long term (current) use of inhibitors of nucleotide synthesis: Secondary | ICD-10-CM | POA: Insufficient documentation

## 2022-05-07 DIAGNOSIS — J4489 Other specified chronic obstructive pulmonary disease: Secondary | ICD-10-CM | POA: Diagnosis not present

## 2022-05-07 DIAGNOSIS — Z62810 Personal history of physical and sexual abuse in childhood: Secondary | ICD-10-CM | POA: Diagnosis not present

## 2022-05-07 DIAGNOSIS — N390 Urinary tract infection, site not specified: Secondary | ICD-10-CM | POA: Diagnosis not present

## 2022-05-07 DIAGNOSIS — G4700 Insomnia, unspecified: Secondary | ICD-10-CM | POA: Insufficient documentation

## 2022-05-07 DIAGNOSIS — Z21 Asymptomatic human immunodeficiency virus [HIV] infection status: Secondary | ICD-10-CM | POA: Diagnosis not present

## 2022-05-07 DIAGNOSIS — Z560 Unemployment, unspecified: Secondary | ICD-10-CM | POA: Diagnosis not present

## 2022-05-07 DIAGNOSIS — Z6379 Other stressful life events affecting family and household: Secondary | ICD-10-CM | POA: Diagnosis not present

## 2022-05-07 DIAGNOSIS — F332 Major depressive disorder, recurrent severe without psychotic features: Secondary | ICD-10-CM | POA: Diagnosis not present

## 2022-05-07 DIAGNOSIS — N3281 Overactive bladder: Secondary | ICD-10-CM | POA: Diagnosis not present

## 2022-05-07 DIAGNOSIS — G4730 Sleep apnea, unspecified: Secondary | ICD-10-CM | POA: Diagnosis not present

## 2022-05-07 DIAGNOSIS — Z630 Problems in relationship with spouse or partner: Secondary | ICD-10-CM | POA: Diagnosis not present

## 2022-05-07 LAB — POCT URINE DRUG SCREEN - MANUAL ENTRY (I-SCREEN)
POC Amphetamine UR: NOT DETECTED
POC Buprenorphine (BUP): NOT DETECTED
POC Cocaine UR: NOT DETECTED
POC Marijuana UR: NOT DETECTED
POC Methadone UR: NOT DETECTED
POC Methamphetamine UR: NOT DETECTED
POC Morphine: NOT DETECTED
POC Oxazepam (BZO): POSITIVE — AB
POC Oxycodone UR: NOT DETECTED
POC Secobarbital (BAR): NOT DETECTED

## 2022-05-07 LAB — RESP PANEL BY RT-PCR (RSV, FLU A&B, COVID)  RVPGX2
Influenza A by PCR: NEGATIVE
Influenza B by PCR: NEGATIVE
Resp Syncytial Virus by PCR: NEGATIVE
SARS Coronavirus 2 by RT PCR: NEGATIVE

## 2022-05-07 LAB — POC URINE PREG, ED: Preg Test, Ur: NEGATIVE

## 2022-05-07 MED ORDER — ALUM & MAG HYDROXIDE-SIMETH 200-200-20 MG/5ML PO SUSP: 30.0000 mL | ORAL | Status: DC | PRN

## 2022-05-07 MED ORDER — PANTOPRAZOLE SODIUM 40 MG PO TBEC
40.0000 mg | DELAYED_RELEASE_TABLET | Freq: Every day | ORAL | Status: DC
  Administered 2022-05-07: 40 mg via ORAL
  Filled 2022-05-07: qty 1

## 2022-05-07 MED ORDER — ALBUTEROL SULFATE HFA 108 (90 BASE) MCG/ACT IN AERS: 2.0000 | INHALATION_SPRAY | RESPIRATORY_TRACT | Status: DC | PRN

## 2022-05-07 MED ORDER — ESCITALOPRAM OXALATE 10 MG PO TABS
20.0000 mg | ORAL_TABLET | Freq: Every day | ORAL | Status: DC
  Administered 2022-05-07: 20 mg via ORAL
  Filled 2022-05-07: qty 2

## 2022-05-07 MED ORDER — MAGNESIUM HYDROXIDE 400 MG/5ML PO SUSP: 30.0000 mL | Freq: Every day | ORAL | Status: DC | PRN

## 2022-05-07 MED ORDER — OLANZAPINE 5 MG PO TBDP: 5.0000 mg | ORAL_TABLET | Freq: Three times a day (TID) | ORAL | Status: DC | PRN

## 2022-05-07 MED ORDER — LORAZEPAM 1 MG PO TABS
1.0000 mg | ORAL_TABLET | ORAL | Status: AC | PRN
  Administered 2022-05-07: 1 mg via ORAL
  Filled 2022-05-07: qty 1

## 2022-05-07 MED ORDER — ZIPRASIDONE MESYLATE 20 MG IM SOLR: 20.0000 mg | INTRAMUSCULAR | Status: DC | PRN

## 2022-05-07 MED ORDER — ACYCLOVIR 200 MG PO CAPS
800.0000 mg | ORAL_CAPSULE | Freq: Every day | ORAL | Status: DC
  Administered 2022-05-07: 800 mg via ORAL
  Filled 2022-05-07: qty 4

## 2022-05-07 MED ORDER — ACETAMINOPHEN 325 MG PO TABS: 650.0000 mg | ORAL_TABLET | Freq: Four times a day (QID) | ORAL | Status: DC | PRN

## 2022-05-07 MED ORDER — HYDROXYZINE HCL 25 MG PO TABS
25.0000 mg | ORAL_TABLET | Freq: Three times a day (TID) | ORAL | Status: DC | PRN
  Administered 2022-05-07: 25 mg via ORAL
  Filled 2022-05-07: qty 1

## 2022-05-07 MED ORDER — ATORVASTATIN CALCIUM 40 MG PO TABS: 40.0000 mg | ORAL_TABLET | Freq: Every evening | ORAL | Status: DC

## 2022-05-07 MED ORDER — TRAZODONE HCL 50 MG PO TABS: 50.0000 mg | ORAL_TABLET | Freq: Every evening | ORAL | Status: DC | PRN

## 2022-05-07 MED ORDER — MIRABEGRON ER 25 MG PO TB24
25.0000 mg | ORAL_TABLET | Freq: Every day | ORAL | Status: DC
  Administered 2022-05-07: 25 mg via ORAL
  Filled 2022-05-07 (×2): qty 1
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
## 2022-05-07 NOTE — ED Notes (Signed)
Provider states to give patient ativan to help with aniexty.

## 2022-05-07 NOTE — ED Notes (Addendum)
Patient reported that she felt anxious,Another nurse on the unit gave patient atarax 25 mg @ 1111. Reports aneixty is better. States that she had pain on left side of chest. States pain is better. Vitals are stable. 99/60 pulse 85 oxygen 96% room air. Will notify provider.Patient is resting and breathing non labored. Will continue to monitor.

## 2022-05-07 NOTE — ED Notes (Signed)
Patient vitals were stable at discharge. Patient left @ 1433. Patient A&O x 4, ambulatory. Patient discharged in no acute distress. Patient endorses SI/ denies HI, A/VH upon discharge.  Pt belongings returned to patient from locker #  3 intact. Patient escorted to [place] via staff for transport to destination which is [facility]. Safety maintained. Patient denied any issues . Breathing non labored.
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
## 2022-05-07 NOTE — ED Notes (Signed)
Rn waiting for [place] to call back for report

## 2022-05-07 NOTE — ED Notes (Signed)
Patient in bed.  Environment is secured. Will continue to monitor for safety.

## 2022-05-07 NOTE — ED Notes (Signed)
Rn called [place] left message that patient received ativan '1mg'$  for aniexty. [place] to call back if there is an issues.

## 2022-05-07 NOTE — ED Notes (Signed)
Provider was notified.

## 2022-05-07 NOTE — ED Provider Notes (Cosign Needed Addendum)
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 96.1  80.0 - 100.0 fL Final   MCH 01/06/2022 33.7 (H)  27.0 - 33.0 pg Final   MCHC 01/06/2022 35.1  32.0 - 36.0 g/dL Final   RDW 01/06/2022 13.5  11.0 - 15.0 % Final   Platelets 01/06/2022 323  140 - 400 Thousand/uL Final   MPV 01/06/2022 11.6  7.5 - 12.5 fL Final   Neutro Abs 01/06/2022 6,494  1,500 - 7,800 cells/uL Final   Lymphs Abs 01/06/2022 2,178  850 - 3,900 cells/uL Final   Absolute Monocytes 01/06/2022 1,020 (H)  200 - 950 cells/uL Final   Eosinophils Absolute 01/06/2022 149  15 - 500 cells/uL Final   Basophils Absolute 01/06/2022 59  0 - 200 cells/uL Final   Neutrophils Relative %  01/06/2022 65.6  % Final    Total Lymphocyte 01/06/2022 22.0  % Final   Monocytes Relative 01/06/2022 10.3  % Final   Eosinophils Relative 01/06/2022 1.5  % Final   Basophils Relative 01/06/2022 0.6  % Final   Neisseria Gonorrhea 01/06/2022 Negative   Final   Chlamydia 01/06/2022 Negative   Final   Comment 01/06/2022 Normal Reference Ranger Chlamydia - Negative   Final   Comment 01/06/2022 Normal Reference Range Neisseria Gonorrhea - Negative   Final    Allergies: Aripiprazole and Codeine  Medications:  Facility Ordered Medications  Medication   acetaminophen (TYLENOL) tablet 650 mg   alum & mag hydroxide-simeth (MAALOX/MYLANTA) 200-200-20 MG/5ML suspension 30 mL   magnesium hydroxide (MILK OF MAGNESIA) suspension 30 mL   hydrOXYzine (ATARAX) tablet 25 mg   traZODone (DESYREL) tablet 50 mg   OLANZapine zydis (ZYPREXA) disintegrating tablet 5 mg   And   LORazepam (ATIVAN) tablet 1 mg   And   ziprasidone (GEODON) injection 20 mg   acyclovir (ZOVIRAX) tablet 800 mg   albuterol (VENTOLIN HFA) 108 (90 Base) MCG/ACT inhaler 2 puff   atorvastatin (LIPITOR) tablet 40 mg   escitalopram (LEXAPRO) tablet 20 mg   pantoprazole (PROTONIX) EC tablet 40 mg   mirabegron ER (MYRBETRIQ) tablet 25 mg   PTA Medications  Medication Sig   Melatonin 1 MG CAPS Take 2 mg by mouth at bedtime as needed (sleep).   albuterol (PROVENTIL HFA;VENTOLIN HFA) 108 (90 Base) MCG/ACT inhaler Inhale 2 puffs into the lungs every 4 (four) hours as needed for wheezing or shortness of breath.   estradiol (ESTRACE) 1 MG tablet Take 1 mg by mouth at bedtime.   atorvastatin (LIPITOR) 40 MG tablet Take 40 mg by mouth every evening.   escitalopram (LEXAPRO) 20 MG tablet TAKE (1) TABLET BY MOUTH ONCE DAILY. (Patient taking differently: Take 20 mg by mouth daily.)   acyclovir (ZOVIRAX) 800 MG tablet TAKE (1) TABLET BY MOUTH ONCE DAILY. (Patient taking differently: 800 mg daily.)   tiZANidine (ZANAFLEX) 2 MG tablet Take 1-2 tablets (2-4 mg total) by mouth  2 (two) times daily as needed for muscle spasms.   Vibegron (GEMTESA) 75 MG TABS Take 75 mg by mouth daily.   ondansetron (ZOFRAN) 4 MG tablet Take 1 tablet (4 mg total) by mouth every 6 (six) hours. (Patient taking differently: Take 4 mg by mouth every 6 (six) hours as needed for nausea or vomiting.)   cabotegravir & rilpivirine ER (CABENUVA) 600 & 900 MG/3ML injection Inject 1 kit into the muscle every 30 (thirty) days.   omeprazole (PRILOSEC) 20 MG capsule Take 20 mg by mouth daily.   Ibuprofen-diphenhydrAMINE Cit (IBUPROFEN PM) 200-38 MG TABS Take 2 tablets by mouth at bedtime as needed (For pain or sleep).    Medical Decision Making  Lab Orders         Resp panel by RT-PCR (RSV, Flu A&B, Covid) Anterior Nasal Swab         CBC with Differential/Platelet         Comprehensive metabolic panel         Hemoglobin A1c         Ethanol         Lipid panel         TSH         POCT Urine Drug Screen - (I-Screen)         POC urine preg, ED     Meds ordered this encounter  Medications   acetaminophen (TYLENOL) tablet 650 mg   alum & mag hydroxide-simeth (MAALOX/MYLANTA) 200-200-20 MG/5ML suspension 30 mL   magnesium hydroxide (MILK OF MAGNESIA) suspension 30 mL   hydrOXYzine (ATARAX) tablet 25 mg   traZODone (DESYREL) tablet 50 mg   AND Linked Order Group    OLANZapine zydis (ZYPREXA) disintegrating tablet 5 mg    LORazepam (ATIVAN) tablet 1 mg    ziprasidone (GEODON) injection 20 mg   acyclovir (ZOVIRAX) tablet 800 mg   albuterol (VENTOLIN HFA) 108 (90 Base) MCG/ACT inhaler 2 puff   atorvastatin (LIPITOR) tablet 40 mg   escitalopram (LEXAPRO) tablet 20 mg   pantoprazole (PROTONIX) EC tablet 40 mg   mirabegron ER (MYRBETRIQ) tablet 25 mg    Recommendations  Based on my evaluation the patient does not appear to have an emergency medical condition.  Tharon Aquas, NP 05/07/22  9:32 AM

## 2022-05-07 NOTE — ED Notes (Signed)
Patient reports aniexty.

## 2022-05-07 NOTE — ED Notes (Addendum)
Notified Chelsey Henson that provider states that patient does not need her hiv medication until 06/23/22. Chelsey Henson states that they dont have hiv medication so they wanted to know was she on medication daily.Patient get injection.monthly.Chelsey Henson was fine with that.

## 2022-05-07 NOTE — Discharge Instructions (Signed)
Patient is being transferred to [facility] for inpatient psychiatric admission. 

## 2022-05-07 NOTE — Progress Notes (Signed)
LCSW Progress Note  [id number]   Chelsey Henson  05/07/2022  11:51 AM  Description:   Inpatient Psychiatric Referral  Patient was recommended inpatient per Elvin So, NP. There are no available beds at [facility]. Patient was referred to the following facilities:   Destination  Service Provider Address Phone Fax  [address] [place] [address] [phone]  [facility]  [address] [place] [address] [phone]  [place], [place] [phone] [phone]  [facility]  [place], [place] [address] [phone] [place]  [address] [place] [address] [phone]  [facility]  [place], [place] [address] [phone]  [facility]  [address] [place] [address] [phone] [place]  [place], [place] [address] [phone] [facility]  [id number] [place] [address] [phone] [facility]  [address] [place], [place] [address] [phone] [address] [place] [address] [place]  [facility]  [address], [place] [address] [phone]  [place] [place] [address] [phone]  [facility]  [place] [address] [phone]  [facility]  [address], [place] [address] [phone] [facility]  [address] [place] [address] [phone] [facility]  [address] [place] [address] [place]  [address] [place] [address] [phone] [facility]  [address] [place] [address] [id number]  [facility]  [address], [place] [address] [phone]  [facility]  [address] [place], [place] [address] [phone]  [place]  [address] [place] [address] [phone]  [facility]  [address] [place] [address] [phone]  [facility]  [address] [place] [address] [phone] [facility]  [address] [place] [address] [phone] [facility]  [address], [place] [address] [place]  [facility]  [address], [place]  [address] [phone]    Situation ongoing, CSW to continue following and update chart as more information becomes available.      Denna Haggard, Latanya Presser  05/07/2022 11:51 AM

## 2022-05-07 NOTE — ED Notes (Signed)
Reports called to carrie shaw at [phone]
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
## 2022-05-07 NOTE — Progress Notes (Signed)
Pt was accepted to [facility] 05/07/2022. Bed assignment: Main campus  Pt meets inpatient criteria per Elvin So, NP  Attending Physician will be Christella Noa, MD  Report can be called to: [phone] (this is a pager, please leave call-back number when giving report)  Bed is ready now  Care Team Notified: Elvin So, NP, Mardene Sayer, RN, and Oris Drone, RN  [place], [place]  05/07/2022 12:58 PM

## 2022-05-07 NOTE — Progress Notes (Signed)
Patient reported an anxiety attack.  Verbal suopport and '25mg'$  vistaril PO PRN given.

## 2022-05-07 NOTE — ED Notes (Signed)
Pt admitted to obs. endorsing SI/ denies HI/AVH. Calm, cooperative throughout interview process. Skin assessment completed. Oriented to unit. Meal and drink offered. At [place], pt continue to endorse SI denies/HI/AVH. Pt verbally contract for safety. Will monitor for safety. Patient belongings were put in locker 3

## 2022-05-07 NOTE — ED Notes (Signed)
Rn was unable to draw patients labs. Patient states that she has not drank much lately. Advised to hydrate and will try to restick later. Second rn tired also and was unsuccessful.

## 2022-05-07 NOTE — ED Notes (Signed)
Patient is  sitting  on her bed breathing non labored .

## 2022-05-07 NOTE — ED Notes (Signed)
Safe transport called at 1350

## 2022-05-08 DIAGNOSIS — R300 Dysuria: Secondary | ICD-10-CM | POA: Diagnosis not present

## 2022-05-08 DIAGNOSIS — N390 Urinary tract infection, site not specified: Secondary | ICD-10-CM | POA: Diagnosis not present

## 2022-05-08 DIAGNOSIS — F332 Major depressive disorder, recurrent severe without psychotic features: Secondary | ICD-10-CM | POA: Diagnosis not present

## 2022-05-08 DIAGNOSIS — N3281 Overactive bladder: Secondary | ICD-10-CM | POA: Diagnosis not present

## 2022-05-08 DIAGNOSIS — R45851 Suicidal ideations: Secondary | ICD-10-CM | POA: Diagnosis not present

## 2022-05-08 DIAGNOSIS — F411 Generalized anxiety disorder: Secondary | ICD-10-CM | POA: Diagnosis not present

## 2022-05-08 DIAGNOSIS — J4489 Other specified chronic obstructive pulmonary disease: Secondary | ICD-10-CM | POA: Diagnosis not present

## 2022-05-08 DIAGNOSIS — F410 Panic disorder [episodic paroxysmal anxiety] without agoraphobia: Secondary | ICD-10-CM | POA: Diagnosis not present

## 2022-05-08 DIAGNOSIS — F419 Anxiety disorder, unspecified: Secondary | ICD-10-CM | POA: Diagnosis not present

## 2022-05-08 DIAGNOSIS — F32A Depression, unspecified: Secondary | ICD-10-CM | POA: Diagnosis not present

## 2022-05-10 DIAGNOSIS — N3281 Overactive bladder: Secondary | ICD-10-CM | POA: Diagnosis not present

## 2022-05-10 DIAGNOSIS — F411 Generalized anxiety disorder: Secondary | ICD-10-CM | POA: Diagnosis not present

## 2022-05-10 DIAGNOSIS — F419 Anxiety disorder, unspecified: Secondary | ICD-10-CM | POA: Diagnosis not present

## 2022-05-10 DIAGNOSIS — F410 Panic disorder [episodic paroxysmal anxiety] without agoraphobia: Secondary | ICD-10-CM | POA: Diagnosis not present

## 2022-05-10 DIAGNOSIS — F332 Major depressive disorder, recurrent severe without psychotic features: Secondary | ICD-10-CM | POA: Diagnosis not present

## 2022-05-10 DIAGNOSIS — R300 Dysuria: Secondary | ICD-10-CM | POA: Diagnosis not present

## 2022-05-10 DIAGNOSIS — B20 Human immunodeficiency virus [HIV] disease: Secondary | ICD-10-CM | POA: Diagnosis not present

## 2022-05-10 DIAGNOSIS — F32A Depression, unspecified: Secondary | ICD-10-CM | POA: Diagnosis not present

## 2022-05-10 DIAGNOSIS — R45851 Suicidal ideations: Secondary | ICD-10-CM | POA: Diagnosis not present

## 2022-05-10 DIAGNOSIS — N390 Urinary tract infection, site not specified: Secondary | ICD-10-CM | POA: Diagnosis not present

## 2022-05-10 DIAGNOSIS — J4489 Other specified chronic obstructive pulmonary disease: Secondary | ICD-10-CM | POA: Diagnosis not present

## 2022-05-11 DIAGNOSIS — R300 Dysuria: Secondary | ICD-10-CM | POA: Diagnosis not present

## 2022-05-11 DIAGNOSIS — J4489 Other specified chronic obstructive pulmonary disease: Secondary | ICD-10-CM | POA: Diagnosis not present

## 2022-05-11 DIAGNOSIS — F32A Depression, unspecified: Secondary | ICD-10-CM | POA: Diagnosis not present

## 2022-05-11 DIAGNOSIS — F332 Major depressive disorder, recurrent severe without psychotic features: Secondary | ICD-10-CM | POA: Diagnosis not present

## 2022-05-11 DIAGNOSIS — F410 Panic disorder [episodic paroxysmal anxiety] without agoraphobia: Secondary | ICD-10-CM | POA: Diagnosis not present

## 2022-05-11 DIAGNOSIS — N3281 Overactive bladder: Secondary | ICD-10-CM | POA: Diagnosis not present

## 2022-05-11 DIAGNOSIS — N390 Urinary tract infection, site not specified: Secondary | ICD-10-CM | POA: Diagnosis not present

## 2022-05-11 DIAGNOSIS — F411 Generalized anxiety disorder: Secondary | ICD-10-CM | POA: Diagnosis not present

## 2022-05-11 DIAGNOSIS — F419 Anxiety disorder, unspecified: Secondary | ICD-10-CM | POA: Diagnosis not present

## 2022-05-11 DIAGNOSIS — R45851 Suicidal ideations: Secondary | ICD-10-CM | POA: Diagnosis not present

## 2022-05-12 ENCOUNTER — Telehealth: Payer: Medicaid Other | Admitting: Internal Medicine

## 2022-05-12 DIAGNOSIS — F419 Anxiety disorder, unspecified: Secondary | ICD-10-CM | POA: Diagnosis not present

## 2022-05-12 DIAGNOSIS — N390 Urinary tract infection, site not specified: Secondary | ICD-10-CM | POA: Diagnosis not present

## 2022-05-12 DIAGNOSIS — F32A Depression, unspecified: Secondary | ICD-10-CM | POA: Diagnosis not present

## 2022-05-12 DIAGNOSIS — B20 Human immunodeficiency virus [HIV] disease: Secondary | ICD-10-CM | POA: Diagnosis not present

## 2022-05-12 DIAGNOSIS — R300 Dysuria: Secondary | ICD-10-CM | POA: Diagnosis not present

## 2022-05-12 DIAGNOSIS — J4489 Other specified chronic obstructive pulmonary disease: Secondary | ICD-10-CM | POA: Diagnosis not present

## 2022-05-12 DIAGNOSIS — R45851 Suicidal ideations: Secondary | ICD-10-CM | POA: Diagnosis not present

## 2022-05-12 DIAGNOSIS — F411 Generalized anxiety disorder: Secondary | ICD-10-CM | POA: Diagnosis not present

## 2022-05-12 DIAGNOSIS — F410 Panic disorder [episodic paroxysmal anxiety] without agoraphobia: Secondary | ICD-10-CM | POA: Diagnosis not present

## 2022-05-12 DIAGNOSIS — F332 Major depressive disorder, recurrent severe without psychotic features: Secondary | ICD-10-CM | POA: Diagnosis not present

## 2022-05-12 DIAGNOSIS — N3281 Overactive bladder: Secondary | ICD-10-CM | POA: Diagnosis not present

## 2022-05-13 DIAGNOSIS — N390 Urinary tract infection, site not specified: Secondary | ICD-10-CM | POA: Diagnosis not present

## 2022-05-13 DIAGNOSIS — N3281 Overactive bladder: Secondary | ICD-10-CM | POA: Diagnosis not present

## 2022-05-13 DIAGNOSIS — J4489 Other specified chronic obstructive pulmonary disease: Secondary | ICD-10-CM | POA: Diagnosis not present

## 2022-05-13 DIAGNOSIS — F411 Generalized anxiety disorder: Secondary | ICD-10-CM | POA: Diagnosis not present

## 2022-05-13 DIAGNOSIS — F332 Major depressive disorder, recurrent severe without psychotic features: Secondary | ICD-10-CM | POA: Diagnosis not present

## 2022-05-13 DIAGNOSIS — R300 Dysuria: Secondary | ICD-10-CM | POA: Diagnosis not present

## 2022-05-13 DIAGNOSIS — R45851 Suicidal ideations: Secondary | ICD-10-CM | POA: Diagnosis not present

## 2022-05-13 DIAGNOSIS — F32A Depression, unspecified: Secondary | ICD-10-CM | POA: Diagnosis not present

## 2022-05-13 DIAGNOSIS — F410 Panic disorder [episodic paroxysmal anxiety] without agoraphobia: Secondary | ICD-10-CM | POA: Diagnosis not present

## 2022-05-13 DIAGNOSIS — F419 Anxiety disorder, unspecified: Secondary | ICD-10-CM | POA: Diagnosis not present

## 2022-05-14 DIAGNOSIS — N3281 Overactive bladder: Secondary | ICD-10-CM | POA: Diagnosis not present

## 2022-05-14 DIAGNOSIS — F332 Major depressive disorder, recurrent severe without psychotic features: Secondary | ICD-10-CM | POA: Diagnosis not present

## 2022-05-14 DIAGNOSIS — J4489 Other specified chronic obstructive pulmonary disease: Secondary | ICD-10-CM | POA: Diagnosis not present

## 2022-05-14 DIAGNOSIS — R300 Dysuria: Secondary | ICD-10-CM | POA: Diagnosis not present

## 2022-05-14 DIAGNOSIS — F419 Anxiety disorder, unspecified: Secondary | ICD-10-CM | POA: Diagnosis not present

## 2022-05-14 DIAGNOSIS — F410 Panic disorder [episodic paroxysmal anxiety] without agoraphobia: Secondary | ICD-10-CM | POA: Diagnosis not present

## 2022-05-14 DIAGNOSIS — F32A Depression, unspecified: Secondary | ICD-10-CM | POA: Diagnosis not present

## 2022-05-14 DIAGNOSIS — R45851 Suicidal ideations: Secondary | ICD-10-CM | POA: Diagnosis not present

## 2022-05-14 DIAGNOSIS — F411 Generalized anxiety disorder: Secondary | ICD-10-CM | POA: Diagnosis not present

## 2022-05-14 DIAGNOSIS — N390 Urinary tract infection, site not specified: Secondary | ICD-10-CM | POA: Diagnosis not present

## 2022-05-17 DIAGNOSIS — N951 Menopausal and female climacteric states: Secondary | ICD-10-CM | POA: Diagnosis not present

## 2022-05-17 DIAGNOSIS — F419 Anxiety disorder, unspecified: Secondary | ICD-10-CM | POA: Diagnosis not present

## 2022-05-17 DIAGNOSIS — F329 Major depressive disorder, single episode, unspecified: Secondary | ICD-10-CM | POA: Diagnosis not present

## 2022-05-17 DIAGNOSIS — Z79899 Other long term (current) drug therapy: Secondary | ICD-10-CM | POA: Diagnosis not present

## 2022-05-18 ENCOUNTER — Telehealth: Payer: Self-pay

## 2022-05-18 NOTE — Telephone Encounter (Signed)
Was able to get patient connected with Marlowe Kays for case management. Attempted to call patient to inform her that she should get a call from case manager. Left voicemail.  Will send mychart message. Leatrice Jewels, RMA

## 2022-05-18 NOTE — Telephone Encounter (Signed)
Called patient regarding discharge summary she dropped off for Dr. Linus Salmons. Was recently admitted due to her mental health. Spoke with patient who states she wanted to make Dr. Linus Salmons aware of what is going on since her last visit.  States that she is currently staying at a shelter. Is trying to find a job and eventually find somewhere to stay. Recently CPS took her daughter away.  Patient is interested in Case Management. Will try to get some information for patient and call her back. Leatrice Jewels, RMA

## 2022-05-21 ENCOUNTER — Telehealth: Payer: Self-pay | Admitting: Internal Medicine

## 2022-05-21 NOTE — Telephone Encounter (Signed)
Chelsey Henson left a VM requesting to switch her Tuesday, March 26th appointment to a telephone visit due to housing issues. Please let me know your decision and I will follow-up with the patient. Thank you

## 2022-05-24 DIAGNOSIS — W57XXXA Bitten or stung by nonvenomous insect and other nonvenomous arthropods, initial encounter: Secondary | ICD-10-CM | POA: Diagnosis not present

## 2022-05-24 DIAGNOSIS — S40862A Insect bite (nonvenomous) of left upper arm, initial encounter: Secondary | ICD-10-CM | POA: Diagnosis not present

## 2022-05-25 ENCOUNTER — Other Ambulatory Visit: Payer: Self-pay

## 2022-05-25 ENCOUNTER — Telehealth (INDEPENDENT_AMBULATORY_CARE_PROVIDER_SITE_OTHER): Payer: Medicaid Other | Admitting: Internal Medicine

## 2022-05-25 ENCOUNTER — Encounter: Payer: Self-pay | Admitting: Internal Medicine

## 2022-05-25 DIAGNOSIS — B20 Human immunodeficiency virus [HIV] disease: Secondary | ICD-10-CM

## 2022-05-25 DIAGNOSIS — R7303 Prediabetes: Secondary | ICD-10-CM | POA: Diagnosis not present

## 2022-05-25 MED ORDER — ELVITEG-COBIC-EMTRICIT-TENOFAF 150-150-200-10 MG PO TABS: 1.0000 | ORAL_TABLET | Freq: Every day | ORAL | 11 refills | Status: DC

## 2022-05-25 NOTE — Assessment & Plan Note (Signed)
Doing well on Genvoya and ok to continue.  Will have her return in about 2-3 months for routine follow up when she is able to get here better.  Refills provided.

## 2022-05-25 NOTE — Progress Notes (Signed)
   Subjective:    Patient ID: Chelsey Henson, female    DOB: 1970/12/27, 52 y.o.   MRN: [id number]  I connected with  Chelsey Henson on 05/25/22 by a video enabled telemedicine application and verified that I am speaking with the correct person using two identifiers.   I discussed the limitations of evaluation and management by telemedicine. The patient expressed understanding and agreed to proceed.  Location: Patient - home Physician - clinic  Duration of visit:  15 minutes  HPI Visit is for follow up of HIV She has recently been in behavioral health hospital and was having issues with her husband and now has moved out and separated from him.   She switched back to the [facility] with everything going on in her life and feels better back on this.  Last viral load was not detected.     Review of Systems  Constitutional:  Negative for fatigue.  Skin:  Negative for rash.  Psychiatric/Behavioral:  Negative for suicidal ideas.        Objective:   Physical Exam Eyes:     General: No scleral icterus. Pulmonary:     Effort: Pulmonary effort is normal.  Neurological:     Mental Status: She is alert.           Assessment & Plan:

## 2022-05-31 ENCOUNTER — Encounter (INDEPENDENT_AMBULATORY_CARE_PROVIDER_SITE_OTHER): Payer: Self-pay | Admitting: *Deleted

## 2022-05-31 DIAGNOSIS — B20 Human immunodeficiency virus [HIV] disease: Secondary | ICD-10-CM | POA: Diagnosis not present

## 2022-05-31 DIAGNOSIS — F329 Major depressive disorder, single episode, unspecified: Secondary | ICD-10-CM | POA: Diagnosis not present

## 2022-05-31 DIAGNOSIS — N951 Menopausal and female climacteric states: Secondary | ICD-10-CM | POA: Diagnosis not present

## 2022-05-31 DIAGNOSIS — M5442 Lumbago with sciatica, left side: Secondary | ICD-10-CM | POA: Diagnosis not present

## 2022-05-31 DIAGNOSIS — K5900 Constipation, unspecified: Secondary | ICD-10-CM | POA: Diagnosis not present

## 2022-05-31 DIAGNOSIS — R7303 Prediabetes: Secondary | ICD-10-CM | POA: Diagnosis not present

## 2022-05-31 DIAGNOSIS — F419 Anxiety disorder, unspecified: Secondary | ICD-10-CM | POA: Diagnosis not present

## 2022-05-31 DIAGNOSIS — M5441 Lumbago with sciatica, right side: Secondary | ICD-10-CM | POA: Diagnosis not present

## 2022-05-31 DIAGNOSIS — M25561 Pain in right knee: Secondary | ICD-10-CM | POA: Diagnosis not present

## 2022-05-31 DIAGNOSIS — R7401 Elevation of levels of liver transaminase levels: Secondary | ICD-10-CM | POA: Diagnosis not present

## 2022-05-31 DIAGNOSIS — K219 Gastro-esophageal reflux disease without esophagitis: Secondary | ICD-10-CM | POA: Diagnosis not present

## 2022-05-31 DIAGNOSIS — G4700 Insomnia, unspecified: Secondary | ICD-10-CM | POA: Diagnosis not present

## 2022-06-01 ENCOUNTER — Encounter (INDEPENDENT_AMBULATORY_CARE_PROVIDER_SITE_OTHER): Payer: Self-pay | Admitting: *Deleted

## 2022-06-23 ENCOUNTER — Encounter: Payer: Self-pay | Admitting: Pharmacist

## 2022-06-28 ENCOUNTER — Ambulatory Visit
Admission: EM | Admit: 2022-06-28 | Discharge: 2022-06-28 | Disposition: A | Discharge status: Home / Self Care | Payer: Medicaid Other | Attending: Physician Assistant | Admitting: Physician Assistant

## 2022-06-28 DIAGNOSIS — J441 Chronic obstructive pulmonary disease with (acute) exacerbation: Secondary | ICD-10-CM

## 2022-06-28 MED ORDER — PREDNISONE 50 MG PO TABS: ORAL_TABLET | ORAL | 0 refills | Status: DC

## 2022-06-28 NOTE — ED Triage Notes (Signed)
Pt reports she has congestion, watery eyes, chills, coughing, and sore throat x 3 days ago. Took nyquil and sudafed pm but no relief.

## 2022-06-28 NOTE — ED Provider Notes (Signed)
[facility]    CSN: [id number] Arrival date & time: 06/28/22  1453      History   Chief Complaint No chief complaint on file.   HPI Chelsey Henson is a 52 y.o. female.   Pt complains of a cough and congestion.  Pt complains of watery eyes and a runny nose.  Pt has a histroy of copd.  Pt reports mild shortness of breath.  Pt reports symptoms feel like allergies      Past Medical History:  Diagnosis Date   Anemia    Anxiety    Asthma    COPD (chronic obstructive pulmonary disease) (HCC) 04/05/2016   Phreesia 05/28/2020   Depression    Depression    Phreesia 05/28/2020   Genital herpes    GERD (gastroesophageal reflux disease)    HIV (human immunodeficiency virus infection) (HCC) 11/24/2017   HIV infection (HCC) 2008   Hypertension    Routine screening for STI (sexually transmitted infection) 01/19/2022   Sleep apnea    Substance abuse (HCC)    cocaine- last use was Oct 2009   Vaginal Pap smear, abnormal     Patient Active Problem List   Diagnosis Date Noted   Routine screening for STI (sexually transmitted infection) 01/19/2022   Lipoma of buttock    Rash 07/23/2020   Post traumatic stress disorder (PTSD) 04/30/2019   Anxiety 04/30/2019   Encounter to establish care 10/27/2018   Transaminitis 05/24/2017   Gastroesophageal reflux disease without esophagitis 04/06/2017   MDD (major depressive disorder), recurrent episode, moderate (HCC) 09/08/2016   Cocaine use disorder, moderate, in sustained remission (HCC) 09/08/2016   Obstructive sleep apnea (adult) (pediatric) 04/15/2016   Asthma 04/15/2016   HIV (human immunodeficiency virus infection) (HCC) 04/05/2016   Essential hypertension 04/05/2016    Past Surgical History:  Procedure Laterality Date   ABDOMINAL HYSTERECTOMY     BENIGN MASS; total hysterectomy   CESAREAN SECTION     ESOPHAGOGASTRODUODENOSCOPY N/A 06/30/2017   Procedure: ESOPHAGOGASTRODUODENOSCOPY (EGD);  Surgeon: Malissa Hippo,  MD;  Location: [facility];  Service: Endoscopy;  Laterality: N/A;  2:00   HERNIA REPAIR  2017   LIPOMA EXCISION Left 08/14/2021   Procedure: EXCISION LIPOMA, LEFT BUTTOCKS;  Surgeon: Franky Macho, MD;  Location: [facility];  Service: General;  Laterality: Left;    OB History     Gravida  6   Para  4   Term  4   Preterm      AB  2   Living  4      SAB  2   IAB      Ectopic      Multiple      Live Births               Home Medications    Prior to Admission medications   Medication Sig Start Date End Date Taking? Authorizing Provider  predniSONE (DELTASONE) 50 MG tablet One tablet a day for 5 days 06/28/22  Yes Cheron Schaumann K, PA-C  acyclovir (ZOVIRAX) 800 MG tablet TAKE (1) TABLET BY MOUTH ONCE DAILY. Patient taking differently: 800 mg daily. 07/18/20   Heather Roberts, NP  albuterol (PROVENTIL HFA;VENTOLIN HFA) 108 (90 Base) MCG/ACT inhaler Inhale 2 puffs into the lungs every 4 (four) hours as needed for wheezing or shortness of breath. 07/09/17   Eber Hong, MD  ALPRAZolam Prudy Feeler) 0.5 MG tablet Take 0.5 mg by mouth 3 (three) times daily as needed for anxiety.  [provider]  atorvastatin (LIPITOR) 40 MG tablet Take 40 mg by mouth every evening.    [provider]  elvitegravir-cobicistat-emtricitabine-tenofovir (GENVOYA) 150-150-200-10 MG TABS tablet Take 1 tablet by mouth daily with breakfast. 05/25/22   Comer, Belia Heman, MD  escitalopram (LEXAPRO) 20 MG tablet TAKE (1) TABLET BY MOUTH ONCE DAILY. Patient taking differently: Take 20 mg by mouth daily. 06/12/20   Heather Roberts, NP  estradiol (ESTRACE) 1 MG tablet Take 1 mg by mouth at bedtime.    [provider]  Ibuprofen-diphenhydrAMINE Cit (IBUPROFEN PM) 200-38 MG TABS Take 2 tablets by mouth at bedtime as needed (For pain or sleep).    [provider]  Melatonin 1 MG CAPS Take 2 mg by mouth at bedtime as needed (sleep).    [provider]  nystatin cream  (MYCOSTATIN) Apply 1 Application topically 2 (two) times daily as needed for dry skin (Apply to affected area).    [provider]  omeprazole (PRILOSEC) 20 MG capsule Take 20 mg by mouth daily. 02/02/22   [provider]  ondansetron (ZOFRAN) 4 MG tablet Take 1 tablet (4 mg total) by mouth every 6 (six) hours. Patient taking differently: Take 4 mg by mouth every 6 (six) hours as needed for nausea or vomiting. 12/21/21   Triplett, Tammy, PA-C  Suvorexant (BELSOMRA) 20 MG TABS Take 20 mg by mouth at bedtime as needed (For sleep).    [provider]  tiZANidine (ZANAFLEX) 2 MG tablet Take 1-2 tablets (2-4 mg total) by mouth 2 (two) times daily as needed for muscle spasms. 04/17/21   Wallis Bamberg, PA-C  Vibegron (GEMTESA) 75 MG TABS Take 75 mg by mouth daily. 09/07/21   Stoneking, Danford Bad., MD  budesonide-formoterol (SYMBICORT) 160-4.5 MCG/ACT inhaler Inhale 2 puffs into the lungs 2 (two) times daily.  09/25/18  [provider]  montelukast (SINGULAIR) 10 MG tablet Take 1 tablet (10 mg total) by mouth at bedtime. 07/26/17 09/25/18  Comer, Belia Heman, MD    Family History Family History  Problem Relation Age of Onset   Depression Mother    Hyperlipidemia Mother    Learning disabilities Mother    Alcohol abuse Mother 17   Alcohol abuse Father    Arthritis Father    Cancer Father    Heart disease Father    Hypertension Father    Learning disabilities Father    Alcohol abuse Sister        RECOVERED   Alcohol abuse Brother    Alcohol abuse Brother    Alcohol abuse Brother     Social History Social History   Tobacco Use   Smoking status: Never   Smokeless tobacco: Never  Vaping Use   Vaping Use: Never used  Substance Use Topics   Alcohol use: Yes    Alcohol/week: 1.0 standard drink of alcohol    Types: 1 Cans of beer per week    Comment: occ.   Drug use: No    Comment: none in 7 years     Allergies   Aripiprazole and Codeine   Review of  Systems Review of Systems  Constitutional:  Negative for fever.  HENT:  Positive for rhinorrhea and sore throat.   Respiratory:  Positive for cough.   All other systems reviewed and are negative.    Physical Exam Triage Vital Signs ED Triage Vitals  Enc Vitals Group     BP 06/28/22 1628 128/81     Pulse Rate 06/28/22 1628  91     Resp 06/28/22 1628 18     Temp 06/28/22 1628 98 F (36.7 C)     Temp Source 06/28/22 1628 Oral     SpO2 06/28/22 1628 96 %     Weight --      Height --      Head Circumference --      Peak Flow --      Pain Score 06/28/22 1630 0     Pain Loc --      Pain Edu? --      Excl. in GC? --    No data found.  Updated Vital Signs BP 128/81 (BP Location: Right Arm)   Pulse 91   Temp 98 F (36.7 C) (Oral)   Resp 18   LMP 09/30/2014 (Approximate)   SpO2 96%   Visual Acuity Right Eye Distance:   Left Eye Distance:   Bilateral Distance:    Right Eye Near:   Left Eye Near:    Bilateral Near:     Physical Exam Vitals and nursing note reviewed.  Constitutional:      Appearance: She is well-developed.  HENT:     Head: Normocephalic.  Cardiovascular:     Rate and Rhythm: Normal rate.  Pulmonary:     Effort: Pulmonary effort is normal.  Abdominal:     General: Abdomen is flat. There is no distension.  Musculoskeletal:        General: Normal range of motion.     Cervical back: Normal range of motion.  Neurological:     Mental Status: She is alert and oriented to person, place, and time.  Psychiatric:        Mood and Affect: Mood normal.      UC Treatments / Results  Labs (all labs ordered are listed, but only abnormal results are displayed) Labs Reviewed - No data to display  EKG   Radiology No results found.  Procedures Procedures (including critical care time)  Medications Ordered in UC Medications - No data to display  Initial Impression / Assessment and Plan / UC Course  I have reviewed the triage vital signs and the  nursing notes.  Pertinent labs & imaging results that were available during my care of the patient were reviewed by me and considered in my medical decision making (see chart for details).    MDM:  Pt declined xray.  Pt given rx for prednisone.  I advised try allegra or zyrtec.  Pt will follow up with Dr. Margo Aye.   Final Clinical Impressions(s) / UC Diagnoses   Final diagnoses:  COPD exacerbation [facility])   Discharge Instructions   None    ED Prescriptions     Medication Sig Dispense Auth. Provider   predniSONE (DELTASONE) 50 MG tablet One tablet a day for 5 days 5 tablet Elson Areas, [place]      PDMP not reviewed this encounter. An After Visit Summary was printed and given to the patient.       Elson Areas, [place] 06/28/22 1704

## 2022-07-02 ENCOUNTER — Ambulatory Visit (INDEPENDENT_AMBULATORY_CARE_PROVIDER_SITE_OTHER): Payer: Medicaid Other

## 2022-07-02 ENCOUNTER — Ambulatory Visit: Payer: Self-pay

## 2022-07-02 ENCOUNTER — Encounter: Payer: Self-pay | Admitting: Emergency Medicine

## 2022-07-02 ENCOUNTER — Ambulatory Visit
Admission: EM | Admit: 2022-07-02 | Discharge: 2022-07-02 | Disposition: A | Discharge status: Home / Self Care | Payer: Medicaid Other | Attending: Physician Assistant | Admitting: Physician Assistant

## 2022-07-02 DIAGNOSIS — J441 Chronic obstructive pulmonary disease with (acute) exacerbation: Secondary | ICD-10-CM

## 2022-07-02 DIAGNOSIS — R059 Cough, unspecified: Secondary | ICD-10-CM | POA: Diagnosis not present

## 2022-07-02 MED ORDER — SPIRIVA RESPIMAT 2.5 MCG/ACT IN AERS: 2.0000 | INHALATION_SPRAY | Freq: Every day | RESPIRATORY_TRACT | 1 refills | Status: AC

## 2022-07-02 MED ORDER — AMOXICILLIN-POT CLAVULANATE 875-125 MG PO TABS: 1.0000 | ORAL_TABLET | Freq: Two times a day (BID) | ORAL | 0 refills | Status: DC

## 2022-07-02 MED ORDER — PREDNISONE 10 MG (21) PO TBPK: ORAL_TABLET | ORAL | 0 refills | Status: DC

## 2022-07-02 MED ORDER — ALBUTEROL SULFATE HFA 108 (90 BASE) MCG/ACT IN AERS: 2.0000 | INHALATION_SPRAY | RESPIRATORY_TRACT | 1 refills | Status: AC | PRN

## 2022-07-02 MED ORDER — IPRATROPIUM-ALBUTEROL 0.5-2.5 (3) MG/3ML IN SOLN
3.0000 mL | Freq: Once | RESPIRATORY_TRACT | Status: AC
  Administered 2022-07-02: 3 mL via RESPIRATORY_TRACT

## 2022-07-02 MED ORDER — ALBUTEROL SULFATE HFA 108 (90 BASE) MCG/ACT IN AERS: 2.0000 | INHALATION_SPRAY | RESPIRATORY_TRACT | 1 refills | Status: DC | PRN

## 2022-07-02 NOTE — Discharge Instructions (Signed)
Your x-ray showed bronchitis but no pneumonia.  I am concerned that you have not responded to the prednisone and are still having COPD exacerbation.  I have called in a refill of your albuterol to be used every 4-6 hours as needed.  I would also like you to start Spiriva twice daily to help with your symptoms.  Start prednisone taper (this replaces the prednisone burst that you are previously prescribed).  Do not take NSAIDs with this medication including aspirin, ibuprofen/Advil, naproxen/Aleve.  Start Augmentin twice daily for 7 days.  Use over-the-counter medication including Mucinex and Flonase.  Make sure that you are resting and drinking plenty of fluid.  If your symptoms or not improving within a few days or if anything worsens and you have worsening cough, shortness of breath, nausea/vomiting, fever you need to be seen immediately.

## 2022-07-02 NOTE — ED Provider Notes (Signed)
[facility]    CSN: [id number] Arrival date & time: 07/02/22  1545      History   Chief Complaint No chief complaint on file.   HPI Chelsey Henson is a 52 y.o. female.   Patient presents today with a week plus long history of cough.  She was seen by our clinic on 06/28/2022 at which point she declined a chest x-ray.  She was started on prednisone for COPD exacerbation and reports that this provided a minimal amount of improvement.  She continues to have chest tightness, cough, shortness of breath.  Denies any fever, nausea, vomiting, chest pain.  Denies any known sick contacts.  She denies any recent antibiotics.  She does have a history of COPD but is not currently have her albuterol available and has not been taking any maintenance medications.  She does have a history of HIV but this is well-controlled and she is compliant with her antiviral therapy.  Last blood work obtained 04/27/2022 showed that she was undetectable at that time.    Past Medical History:  Diagnosis Date   Anemia    Anxiety    Asthma    COPD (chronic obstructive pulmonary disease) (HCC) 04/05/2016   Phreesia 05/28/2020   Depression    Depression    Phreesia 05/28/2020   Genital herpes    GERD (gastroesophageal reflux disease)    HIV (human immunodeficiency virus infection) (HCC) 11/24/2017   HIV infection (HCC) 2008   Hypertension    Routine screening for STI (sexually transmitted infection) 01/19/2022   Sleep apnea    Substance abuse (HCC)    cocaine- last use was Oct 2009   Vaginal Pap smear, abnormal     Patient Active Problem List   Diagnosis Date Noted   Routine screening for STI (sexually transmitted infection) 01/19/2022   Lipoma of buttock    Rash 07/23/2020   Post traumatic stress disorder (PTSD) 04/30/2019   Anxiety 04/30/2019   Encounter to establish care 10/27/2018   Transaminitis 05/24/2017   Gastroesophageal reflux disease without esophagitis 04/06/2017   MDD (major  depressive disorder), recurrent episode, moderate (HCC) 09/08/2016   Cocaine use disorder, moderate, in sustained remission (HCC) 09/08/2016   Obstructive sleep apnea (adult) (pediatric) 04/15/2016   Asthma 04/15/2016   HIV (human immunodeficiency virus infection) (HCC) 04/05/2016   Essential hypertension 04/05/2016    Past Surgical History:  Procedure Laterality Date   ABDOMINAL HYSTERECTOMY     BENIGN MASS; total hysterectomy   CESAREAN SECTION     ESOPHAGOGASTRODUODENOSCOPY N/A 06/30/2017   Procedure: ESOPHAGOGASTRODUODENOSCOPY (EGD);  Surgeon: Malissa Hippo, MD;  Location: [facility];  Service: Endoscopy;  Laterality: N/A;  2:00   HERNIA REPAIR  2017   LIPOMA EXCISION Left 08/14/2021   Procedure: EXCISION LIPOMA, LEFT BUTTOCKS;  Surgeon: Franky Macho, MD;  Location: [facility];  Service: General;  Laterality: Left;    OB History     Gravida  6   Para  4   Term  4   Preterm      AB  2   Living  4      SAB  2   IAB      Ectopic      Multiple      Live Births               Home Medications    Prior to Admission medications   Medication Sig Start Date End Date Taking? Authorizing Provider  acyclovir (ZOVIRAX) 800  MG tablet TAKE (1) TABLET BY MOUTH ONCE DAILY. Patient taking differently: 800 mg daily. 07/18/20   Heather Roberts, NP  albuterol (VENTOLIN HFA) 108 (90 Base) MCG/ACT inhaler Inhale 2 puffs into the lungs every 4 (four) hours as needed for wheezing or shortness of breath. 07/02/22   Luciann Gossett, Noberto Retort, PA-C  ALPRAZolam (XANAX) 0.5 MG tablet Take 0.5 mg by mouth 3 (three) times daily as needed for anxiety.    [provider]  amoxicillin-clavulanate (AUGMENTIN) 875-125 MG tablet Take 1 tablet by mouth every 12 (twelve) hours. 07/02/22  Yes Celinda Dethlefs K, PA-C  atorvastatin (LIPITOR) 40 MG tablet Take 40 mg by mouth every evening.    [provider]  elvitegravir-cobicistat-emtricitabine-tenofovir (GENVOYA) 150-150-200-10 MG TABS tablet  Take 1 tablet by mouth daily with breakfast. 05/25/22   Comer, Belia Heman, MD  escitalopram (LEXAPRO) 20 MG tablet TAKE (1) TABLET BY MOUTH ONCE DAILY. Patient taking differently: Take 20 mg by mouth daily. 06/12/20   Heather Roberts, NP  estradiol (ESTRACE) 1 MG tablet Take 1 mg by mouth at bedtime.    [provider]  Ibuprofen-diphenhydrAMINE Cit (IBUPROFEN PM) 200-38 MG TABS Take 2 tablets by mouth at bedtime as needed (For pain or sleep).    [provider]  Melatonin 1 MG CAPS Take 2 mg by mouth at bedtime as needed (sleep).    [provider]  nystatin cream (MYCOSTATIN) Apply 1 Application topically 2 (two) times daily as needed for dry skin (Apply to affected area).    [provider]  omeprazole (PRILOSEC) 20 MG capsule Take 20 mg by mouth daily. 02/02/22   [provider]  ondansetron (ZOFRAN) 4 MG tablet Take 1 tablet (4 mg total) by mouth every 6 (six) hours. Patient taking differently: Take 4 mg by mouth every 6 (six) hours as needed for nausea or vomiting. 12/21/21   Triplett, Tammy, PA-C  predniSONE (STERAPRED UNI-PAK 21 TAB) 10 MG (21) TBPK tablet As directed 07/02/22  Yes Nydia Ytuarte K, PA-C  Suvorexant (BELSOMRA) 20 MG TABS Take 20 mg by mouth at bedtime as needed (For sleep).    [provider]  Tiotropium Bromide Monohydrate (SPIRIVA RESPIMAT) 2.5 MCG/ACT AERS Inhale 2 puffs into the lungs daily. 07/02/22  Yes Keniesha Adderly K, PA-C  tiZANidine (ZANAFLEX) 2 MG tablet Take 1-2 tablets (2-4 mg total) by mouth 2 (two) times daily as needed for muscle spasms. 04/17/21   Wallis Bamberg, PA-C  Vibegron (GEMTESA) 75 MG TABS Take 75 mg by mouth daily. 09/07/21   Stoneking, Danford Bad., MD  budesonide-formoterol (SYMBICORT) 160-4.5 MCG/ACT inhaler Inhale 2 puffs into the lungs 2 (two) times daily.  09/25/18  [provider]  montelukast (SINGULAIR) 10 MG tablet Take 1 tablet (10 mg total) by mouth at bedtime. 07/26/17 09/25/18  Comer, Belia Heman,  MD    Family History Family History  Problem Relation Age of Onset   Depression Mother    Hyperlipidemia Mother    Learning disabilities Mother    Alcohol abuse Mother 16   Alcohol abuse Father    Arthritis Father    Cancer Father    Heart disease Father    Hypertension Father    Learning disabilities Father    Alcohol abuse Sister        RECOVERED   Alcohol abuse Brother    Alcohol abuse Brother    Alcohol abuse Brother     Social History Social History   Tobacco Use  Smoking status: Never   Smokeless tobacco: Never  Vaping Use   Vaping Use: Never used  Substance Use Topics   Alcohol use: Yes    Alcohol/week: 1.0 standard drink of alcohol    Types: 1 Cans of beer per week    Comment: occ.   Drug use: No    Comment: none in 7 years     Allergies   Aripiprazole and Codeine   Review of Systems Review of Systems  Constitutional:  Positive for activity change and fatigue. Negative for appetite change and fever.  HENT:  Positive for congestion. Negative for sinus pressure, sneezing and sore throat.   Respiratory:  Positive for cough, chest tightness and shortness of breath. Negative for wheezing.   Cardiovascular:  Negative for chest pain.  Gastrointestinal:  Negative for abdominal pain, diarrhea, nausea and vomiting.     Physical Exam Triage Vital Signs ED Triage Vitals  Enc Vitals Group     BP 07/02/22 1550 126/77     Pulse Rate 07/02/22 1550 83     Resp 07/02/22 1550 18     Temp 07/02/22 1550 97.8 F (36.6 C)     Temp Source 07/02/22 1550 Oral     SpO2 07/02/22 1550 93 %     Weight --      Height --      Head Circumference --      Peak Flow --      Pain Score 07/02/22 1551 5     Pain Loc --      Pain Edu? --      Excl. in GC? --    No data found.  Updated Vital Signs BP 126/77 (BP Location: Right Arm)   Pulse 74   Temp 97.8 F (36.6 C) (Oral)   Resp 18   LMP 09/30/2014 (Approximate)   SpO2 96%   Visual Acuity Right Eye Distance:    Left Eye Distance:   Bilateral Distance:    Right Eye Near:   Left Eye Near:    Bilateral Near:     Physical Exam Vitals reviewed.  Constitutional:      General: She is awake. She is not in acute distress.    Appearance: Normal appearance. She is well-developed. She is not ill-appearing.     Comments: Very pleasant female appears stated age in no acute distress sitting comfortably in exam room  HENT:     Head: Normocephalic and atraumatic.     Right Ear: Tympanic membrane, ear canal and external ear normal. Tympanic membrane is not erythematous or bulging.     Left Ear: Tympanic membrane, ear canal and external ear normal. Tympanic membrane is not erythematous or bulging.     Nose:     Right Sinus: No maxillary sinus tenderness or frontal sinus tenderness.     Left Sinus: No maxillary sinus tenderness or frontal sinus tenderness.     Mouth/Throat:     Dentition: Abnormal dentition.     Pharynx: Uvula midline. No oropharyngeal exudate or posterior oropharyngeal erythema.  Cardiovascular:     Rate and Rhythm: Normal rate and regular rhythm.     Heart sounds: Normal heart sounds, S1 normal and S2 normal. No murmur heard. Pulmonary:     Effort: Pulmonary effort is normal.     Breath sounds: Examination of the right-lower field reveals decreased breath sounds. Examination of the left-lower field reveals decreased breath sounds. Decreased breath sounds present. No wheezing, rhonchi or rales.  Psychiatric:  Behavior: Behavior is cooperative.      UC Treatments / Results  Labs (all labs ordered are listed, but only abnormal results are displayed) Labs Reviewed - No data to display  EKG   Radiology DG Chest 2 View  Result Date: 07/02/2022 CLINICAL DATA:  Cough. EXAM: CHEST - 2 VIEW COMPARISON:  Chest radiograph 05/02/2022. FINDINGS: The cardiomediastinal silhouette is normal. There is mild central bronchial wall thickening. There is no focal consolidation. There is no  pulmonary edema. There is no pleural effusion or pneumothorax There is no acute osseous abnormality. IMPRESSION: Central bronchial wall thickening may reflect bronchitis. No focal consolidation or pleural effusion. Electronically Signed   By: Lesia Hausen M.D.   On: 07/02/2022 16:22    Procedures Procedures (including critical care time)  Medications Ordered in UC Medications  ipratropium-albuterol (DUONEB) 0.5-2.5 (3) MG/3ML nebulizer solution 3 mL (3 mLs Nebulization Given 07/02/22 1609)    Initial Impression / Assessment and Plan / UC Course  I have reviewed the triage vital signs and the nursing notes.  Pertinent labs & imaging results that were available during my care of the patient were reviewed by me and considered in my medical decision making (see chart for details).     Patient is well-appearing, afebrile, nontoxic, nontachycardic.  Chest x-ray was obtained given worsening symptoms in the setting of COPD and well-controlled HIV which showed focal wall thickening without focal consolidation.  We discussed that symptoms are likely related to ongoing COPD exacerbation.  She was given a DuoNeb in clinic with significant improvement of symptoms.  She was sent home with an albuterol inhaler we will add Spiriva to manage symptoms.  Will start prednisone taper in place of prednisone burst we discussed that she should not take NSAIDs with this medication due to risk of GI bleeding.  Will cover for bacterial infection given persistent/worsening symptoms with Augmentin twice daily for 7 days.  Recommended that she rest and drink plenty of fluid.  She is use over-the-counter medications including Mucinex, Flonase, Tylenol for additional symptom relief.  Discussed that if anything worsens or changes she should be seen immediately.  Strict return precautions given.  Work excuse note provided.  Final Clinical Impressions(s) / UC Diagnoses   Final diagnoses:  COPD exacerbation [facility])     Discharge  Instructions      Your x-ray showed bronchitis but no pneumonia.  I am concerned that you have not responded to the prednisone and are still having COPD exacerbation.  I have called in a refill of your albuterol to be used every 4-6 hours as needed.  I would also like you to start Spiriva twice daily to help with your symptoms.  Start prednisone taper (this replaces the prednisone burst that you are previously prescribed).  Do not take NSAIDs with this medication including aspirin, ibuprofen/Advil, naproxen/Aleve.  Start Augmentin twice daily for 7 days.  Use over-the-counter medication including Mucinex and Flonase.  Make sure that you are resting and drinking plenty of fluid.  If your symptoms or not improving within a few days or if anything worsens and you have worsening cough, shortness of breath, nausea/vomiting, fever you need to be seen immediately.     ED Prescriptions     Medication Sig Dispense Auth. Provider   albuterol (VENTOLIN HFA) 108 (90 Base) MCG/ACT inhaler  (Status: Discontinued) Inhale 2 puffs into the lungs every 4 (four) hours as needed for wheezing or shortness of breath. 1 each Sahaj Bona, Noberto Retort, PA-C  Tiotropium Bromide Monohydrate (SPIRIVA RESPIMAT) 2.5 MCG/ACT AERS Inhale 2 puffs into the lungs daily. 4 g Ebonique Hallstrom K, PA-C   predniSONE (STERAPRED UNI-PAK 21 TAB) 10 MG (21) TBPK tablet As directed 21 tablet Maeleigh Buschman K, PA-C   amoxicillin-clavulanate (AUGMENTIN) 875-125 MG tablet Take 1 tablet by mouth every 12 (twelve) hours. 14 tablet Omarri Eich K, PA-C   albuterol (VENTOLIN HFA) 108 (90 Base) MCG/ACT inhaler Inhale 2 puffs into the lungs every 4 (four) hours as needed for wheezing or shortness of breath. 1 each Vivaan Helseth, Noberto Retort, PA-C      PDMP not reviewed this encounter.   Jeani Hawking, PA-C 07/02/22 1643

## 2022-07-02 NOTE — ED Triage Notes (Signed)
Was seen earlier this week and was given prednisone.  States she is still coughing up mucus and feels tightness in her chest.  States cough is worse at night.  Feels fatigued.  Symptoms x 1 week.

## 2022-07-09 ENCOUNTER — Ambulatory Visit: Payer: Medicaid Other | Admitting: Internal Medicine

## 2022-07-13 ENCOUNTER — Ambulatory Visit (HOSPITAL_COMMUNITY): Payer: Medicaid Other | Admitting: Psychiatry

## 2022-08-23 ENCOUNTER — Other Ambulatory Visit: Payer: Self-pay

## 2022-08-24 ENCOUNTER — Encounter (HOSPITAL_COMMUNITY): Payer: Self-pay | Admitting: Psychiatry

## 2022-08-24 ENCOUNTER — Ambulatory Visit (INDEPENDENT_AMBULATORY_CARE_PROVIDER_SITE_OTHER): Payer: Medicaid Other | Admitting: Psychiatry

## 2022-08-24 DIAGNOSIS — F333 Major depressive disorder, recurrent, severe with psychotic symptoms: Secondary | ICD-10-CM

## 2022-08-24 DIAGNOSIS — F411 Generalized anxiety disorder: Secondary | ICD-10-CM | POA: Diagnosis not present

## 2022-08-24 DIAGNOSIS — F5104 Psychophysiologic insomnia: Secondary | ICD-10-CM

## 2022-08-24 DIAGNOSIS — F410 Panic disorder [episodic paroxysmal anxiety] without agoraphobia: Secondary | ICD-10-CM | POA: Diagnosis not present

## 2022-08-24 DIAGNOSIS — Z79899 Other long term (current) drug therapy: Secondary | ICD-10-CM | POA: Insufficient documentation

## 2022-08-24 DIAGNOSIS — F4310 Post-traumatic stress disorder, unspecified: Secondary | ICD-10-CM | POA: Diagnosis not present

## 2022-08-24 DIAGNOSIS — R7401 Elevation of levels of liver transaminase levels: Secondary | ICD-10-CM

## 2022-08-24 DIAGNOSIS — G4733 Obstructive sleep apnea (adult) (pediatric): Secondary | ICD-10-CM

## 2022-08-24 DIAGNOSIS — J45909 Unspecified asthma, uncomplicated: Secondary | ICD-10-CM

## 2022-08-24 MED ORDER — ALPRAZOLAM 0.5 MG PO TABS: ORAL_TABLET | ORAL | 0 refills | Status: AC

## 2022-08-24 MED ORDER — LURASIDONE HCL 20 MG PO TABS: 20.0000 mg | ORAL_TABLET | Freq: Every evening | ORAL | 1 refills | Status: AC

## 2022-08-24 NOTE — Patient Instructions (Signed)
We discontinued to the ibuprofen PM, melatonin, Benadryl and other over-the-counter sleep aids to try and help your liver heal.  We started lurasidone (Latuda) 20 mg nightly and that should help make a little easier to sleep and start to treat some of your depression a bit more.  We also will plan on decreasing the Xanax to 0.25 mg in the morning, 0.5mg  in the afternoon and evening.  We will decrease by 0.25 mg every month until you are completely off of this medication.  Please continue to look for a therapist in the community.  I will coordinate with your PCP to hopefully get some more blood work.
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
## 2022-08-25 DIAGNOSIS — T7840XA Allergy, unspecified, initial encounter: Secondary | ICD-10-CM | POA: Diagnosis not present

## 2022-08-25 DIAGNOSIS — R609 Edema, unspecified: Secondary | ICD-10-CM | POA: Diagnosis not present

## 2022-08-25 DIAGNOSIS — T8069XA Other serum reaction due to other serum, initial encounter: Secondary | ICD-10-CM | POA: Diagnosis not present

## 2022-08-30 ENCOUNTER — Telehealth (HOSPITAL_COMMUNITY): Payer: Self-pay | Admitting: *Deleted

## 2022-08-30 NOTE — Telephone Encounter (Signed)
It's keeping her up, paranoia, anxiety increase since she have been cut back on her Xanax. Per pt no other symptoms. Should patient still increase her script.

## 2022-08-30 NOTE — Telephone Encounter (Signed)
Patient called stating her Chelsey Henson is not working. Per pt she's only been on it for a week now. Not able to fall asleep and still getting raising thoughts.

## 2022-09-01 NOTE — Telephone Encounter (Signed)
Pt called in to follow up on message, advised her of Dr Denyce Robert message and scheduled follow up for Monday

## 2022-09-04 ENCOUNTER — Ambulatory Visit
Admission: EM | Admit: 2022-09-04 | Discharge: 2022-09-04 | Discharge status: Against Medical Advice | Payer: Medicaid Other

## 2022-09-04 ENCOUNTER — Other Ambulatory Visit: Payer: Self-pay

## 2022-09-04 ENCOUNTER — Encounter (HOSPITAL_COMMUNITY): Payer: Self-pay | Admitting: Emergency Medicine

## 2022-09-04 ENCOUNTER — Emergency Department (HOSPITAL_COMMUNITY)
Admission: EM | Admit: 2022-09-04 | Discharge: 2022-09-04 | Disposition: A | Discharge status: Home / Self Care | Payer: Medicaid Other | Attending: Emergency Medicine | Admitting: Emergency Medicine

## 2022-09-04 DIAGNOSIS — T50901A Poisoning by unspecified drugs, medicaments and biological substances, accidental (unintentional), initial encounter: Secondary | ICD-10-CM | POA: Diagnosis not present

## 2022-09-04 DIAGNOSIS — J45909 Unspecified asthma, uncomplicated: Secondary | ICD-10-CM | POA: Diagnosis not present

## 2022-09-04 DIAGNOSIS — R000 Tachycardia, unspecified: Secondary | ICD-10-CM | POA: Insufficient documentation

## 2022-09-04 DIAGNOSIS — T40711A Poisoning by cannabis, accidental (unintentional), initial encounter: Secondary | ICD-10-CM | POA: Diagnosis not present

## 2022-09-04 DIAGNOSIS — I959 Hypotension, unspecified: Secondary | ICD-10-CM | POA: Diagnosis not present

## 2022-09-04 DIAGNOSIS — R450 Nervousness: Secondary | ICD-10-CM | POA: Diagnosis not present

## 2022-09-04 DIAGNOSIS — Z7951 Long term (current) use of inhaled steroids: Secondary | ICD-10-CM | POA: Insufficient documentation

## 2022-09-04 LAB — CBC WITH DIFFERENTIAL/PLATELET
Abs Immature Granulocytes: 0.01 10*3/uL (ref 0.00–0.07)
Basophils Absolute: 0.1 10*3/uL (ref 0.0–0.1)
Basophils Relative: 1 %
Eosinophils Absolute: 0.2 10*3/uL (ref 0.0–0.5)
Eosinophils Relative: 2 %
HCT: 40 % (ref 36.0–46.0)
Hemoglobin: 13.7 g/dL (ref 12.0–15.0)
Immature Granulocytes: 0 %
Lymphocytes Relative: 32 %
Lymphs Abs: 2.5 10*3/uL (ref 0.7–4.0)
MCH: 30.2 pg (ref 26.0–34.0)
MCHC: 34.3 g/dL (ref 30.0–36.0)
MCV: 88.3 fL (ref 80.0–100.0)
Monocytes Absolute: 0.7 10*3/uL (ref 0.1–1.0)
Monocytes Relative: 10 %
Neutro Abs: 4.3 10*3/uL (ref 1.7–7.7)
Neutrophils Relative %: 55 %
Platelets: 222 10*3/uL (ref 150–400)
RBC: 4.53 MIL/uL (ref 3.87–5.11)
RDW: 14.3 % (ref 11.5–15.5)
WBC: 7.8 10*3/uL (ref 4.0–10.5)
nRBC: 0 % (ref 0.0–0.2)

## 2022-09-04 LAB — COMPREHENSIVE METABOLIC PANEL
ALT: 69 U/L — ABNORMAL HIGH (ref 0–44)
AST: 58 U/L — ABNORMAL HIGH (ref 15–41)
Albumin: 3.7 g/dL (ref 3.5–5.0)
Alkaline Phosphatase: 83 U/L (ref 38–126)
Anion gap: 8 (ref 5–15)
BUN: 8 mg/dL (ref 6–20)
CO2: 25 mmol/L (ref 22–32)
Calcium: 8.7 mg/dL — ABNORMAL LOW (ref 8.9–10.3)
Chloride: 104 mmol/L (ref 98–111)
Creatinine, Ser: 0.56 mg/dL (ref 0.44–1.00)
GFR, Estimated: >60 mL/min (ref >60)
Glucose, Bld: 113 mg/dL — ABNORMAL HIGH (ref 70–99)
Potassium: 3.4 mmol/L — ABNORMAL LOW (ref 3.5–5.1)
Sodium: 137 mmol/L (ref 135–145)
Total Bilirubin: 0.7 mg/dL (ref 0.3–1.2)
Total Protein: 7.6 g/dL (ref 6.5–8.1)

## 2022-09-04 LAB — RAPID URINE DRUG SCREEN, HOSP PERFORMED
Amphetamines: NOT DETECTED
Barbiturates: NOT DETECTED
Benzodiazepines: POSITIVE — AB
Cocaine: NOT DETECTED
Opiates: NOT DETECTED
Tetrahydrocannabinol: POSITIVE — AB

## 2022-09-04 LAB — ETHANOL: Alcohol, Ethyl (B): <10 mg/dL (ref <10)

## 2022-09-04 MED ORDER — HALOPERIDOL LACTATE 5 MG/ML IJ SOLN
5.0000 mg | Freq: Once | INTRAMUSCULAR | Status: AC
  Administered 2022-09-04: 5 mg via INTRAVENOUS
  Filled 2022-09-04: qty 1

## 2022-09-04 MED ORDER — DIAZEPAM 5 MG/ML IJ SOLN
5.0000 mg | Freq: Once | INTRAMUSCULAR | Status: AC
  Administered 2022-09-04: 5 mg via INTRAVENOUS
  Filled 2022-09-04: qty 2

## 2022-09-04 MED ORDER — ONDANSETRON 4 MG PO TBDP: 4.0000 mg | ORAL_TABLET | Freq: Three times a day (TID) | ORAL | 0 refills | Status: DC | PRN

## 2022-09-04 MED ORDER — ONDANSETRON HCL 4 MG/2ML IJ SOLN
4.0000 mg | Freq: Once | INTRAMUSCULAR | Status: AC
  Administered 2022-09-04: 4 mg via INTRAVENOUS
  Filled 2022-09-04: qty 2

## 2022-09-04 NOTE — ED Triage Notes (Signed)
Pt arrived by [facility]. Pt states she took 3 CBD/Delta 9 gummies because she couldn't get her xanax prescription filled. Pt Is A/O x4 in triage. Pt unable to sit still at this time. MD at bedside for [facility]

## 2022-09-04 NOTE — Discharge Instructions (Addendum)
It is not unusual to have the symptoms that you did after taking a new medication or drug, please avoid taking the medicine that you took tonight, that over-the-counter medication can cause severe symptoms including racing heart rate, severe anxiety, muscle cramps and all of the things that you experienced.  Thankfully your vital signs are normal, your blood work was unremarkable, you will need to have someone come pick you up and follow-up with your doctor in 2 to 3 days however come back to the ER for severe or worsening symptoms.  Again do not take that medication anymore

## 2022-09-04 NOTE — ED Notes (Signed)
Ambulated pt around nurse station. Tolerated well

## 2022-09-04 NOTE — ED Notes (Signed)
Pt vomited, emesis bag in hand, pt given damp washcloth to wipe mouth. Dr Hyacinth Meeker and primary nurse, Molli Hazard made aware.

## 2022-09-04 NOTE — ED Provider Notes (Signed)
[place] EMERGENCY DEPARTMENT AT [facility] Provider Note   CSN: [id number] Arrival date & time: 09/04/22  1454     History  Chief Complaint  Patient presents with   Ingestion    Chelsey Henson is a 52 y.o. female.   Ingestion   This patient is a 52 year old female with a history of reactive airway disease, anxiety and high cholesterol, she also takes medicines for HIV.  She is on Xanax.  She presents to the hospital today with a complaint of overdose on medication, the patient has had multiple visits to the emergency department for combination of either anxiety or COPD, she is followed in the office with behavioral health secondary to her PTSD and anxiety.  She also has psychophysiologic insomnia as diagnosed by her psychiatrist.  She today tried to take 3 CBD Gummies at 1 time to try to go to sleep and subsequently started to develop significant restless legs, she is pedaling in the air like she is on a bicycle while she is lying on her back, she states "I fucked up"  The patient denies any chest pain shortness of breath headache or blurred vision.  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 Authorizing Provider  ondansetron (ZOFRAN-ODT) 4 MG disintegrating tablet Take 1 tablet (4 mg total) by mouth every 8 (eight) hours as needed for nausea. 09/04/22  Yes Eber Hong, MD  acyclovir (ZOVIRAX) 800 MG tablet TAKE (1) TABLET BY MOUTH ONCE DAILY. Patient taking differently: 800 mg daily. 07/18/20   Heather Roberts, NP  albuterol (VENTOLIN HFA) 108 (90 Base) MCG/ACT inhaler Inhale 2 puffs into the lungs every 4 (four) hours as needed for wheezing or shortness of breath. 07/02/22   Raspet, Noberto Retort, PA-C  ALPRAZolam Prudy Feeler) 0.5 MG tablet Take half a tablet in the morning, full tablet at lunch and bed time. 08/24/22   Elsie Lincoln, MD   atorvastatin (LIPITOR) 40 MG tablet Take 40 mg by mouth every evening.    [provider]  elvitegravir-cobicistat-emtricitabine-tenofovir (GENVOYA) 150-150-200-10 MG TABS tablet Take 1 tablet by mouth daily with breakfast. 05/25/22   Comer, Belia Heman, MD  escitalopram (LEXAPRO) 20 MG tablet TAKE (1) TABLET BY MOUTH ONCE DAILY. Patient taking differently: Take 20 mg by mouth daily. 06/12/20   Heather Roberts, NP  estradiol (ESTRACE) 1 MG tablet Take 1 mg by mouth at bedtime.    [provider]  lurasidone (LATUDA) 20 MG TABS tablet Take 1 tablet (20 mg total) by mouth at bedtime. 08/24/22 10/23/22  Elsie Lincoln, MD  nystatin cream (MYCOSTATIN) Apply 1 Application topically 2 (two) times daily as needed for dry skin (Apply to affected area).    [provider]  omeprazole (PRILOSEC) 20 MG capsule Take 20 mg by mouth daily. 02/02/22   [provider]  Tiotropium Bromide Monohydrate (SPIRIVA RESPIMAT) 2.5 MCG/ACT AERS Inhale 2 puffs into the lungs daily. 07/02/22   Raspet, Noberto Retort, PA-C  Vibegron (GEMTESA) 75 MG TABS Take 75 mg by mouth daily. 09/07/21   Stoneking, Danford Bad., MD  budesonide-formoterol (SYMBICORT) 160-4.5 MCG/ACT inhaler Inhale 2 puffs into the lungs 2 (two) times daily.  09/25/18  [provider]  montelukast (SINGULAIR) 10 MG tablet Take 1 tablet (10 mg total) by mouth at bedtime. 07/26/17 09/25/18  Gardiner Barefoot, MD  Allergies    Aripiprazole and Codeine    Review of Systems   Review of Systems  All other systems reviewed and are negative.   Physical Exam Updated Vital Signs BP 103/78 (BP Location: Left Arm)   Pulse 99   Temp 98.4 F (36.9 C) (Oral)   Resp 18   Ht 1.626 m (5\' 4" )   Wt 106.6 kg   LMP 09/30/2014 (Approximate)   SpO2 93%   BMI 40.34 kg/m  Physical Exam Vitals and nursing note reviewed.  Constitutional:      General: She is in acute distress.     Appearance: She is well-developed.  HENT:     Head:  Normocephalic and atraumatic.     Mouth/Throat:     Pharynx: No oropharyngeal exudate.  Eyes:     General: No scleral icterus.       Right eye: No discharge.        Left eye: No discharge.     Conjunctiva/sclera: Conjunctivae normal.     Pupils: Pupils are equal, round, and reactive to light.  Neck:     Thyroid: No thyromegaly.     Vascular: No JVD.  Cardiovascular:     Rate and Rhythm: Regular rhythm. Tachycardia present.     Heart sounds: Normal heart sounds. No murmur heard.    No friction rub. No gallop.     Comments: Tachycardic to 120 bpm Pulmonary:     Effort: Pulmonary effort is normal. No respiratory distress.     Breath sounds: Normal breath sounds. No wheezing or rales.  Abdominal:     General: Bowel sounds are normal. There is no distension.     Palpations: Abdomen is soft. There is no mass.     Tenderness: There is no abdominal tenderness.  Musculoskeletal:        General: No tenderness. Normal range of motion.     Cervical back: Normal range of motion and neck supple.     Right lower leg: No edema.     Left lower leg: No edema.  Lymphadenopathy:     Cervical: No cervical adenopathy.  Skin:    General: Skin is warm and dry.     Findings: No erythema or rash.  Neurological:     Mental Status: She is alert.     Coordination: Coordination normal.     Comments: The patient is able to move all 4 extremities, she has mild stiffness in her bilateral arms and legs but is moving her legs in a repetitive motion like she is on a bicycle.  She cannot stop, she has slight pressured speech, she is able to assist in sitting up in the bed but cannot do it by herself at this time  Psychiatric:        Behavior: Behavior normal.     ED Results / Procedures / Treatments   Labs (all labs ordered are listed, but only abnormal results are displayed) Labs Reviewed  RAPID URINE DRUG SCREEN, HOSP PERFORMED - Abnormal; Notable for the following components:      Result Value    Benzodiazepines POSITIVE (*)    Tetrahydrocannabinol POSITIVE (*)    All other components within normal limits  COMPREHENSIVE METABOLIC PANEL - Abnormal; Notable for the following components:   Potassium 3.4 (*)    Glucose, Bld 113 (*)    Calcium 8.7 (*)    AST 58 (*)    ALT 69 (*)    All other components within normal limits  CBC WITH DIFFERENTIAL/PLATELET  ETHANOL    EKG EKG Interpretation Date/Time:  Saturday September 04 2022 18:38:42 EDT Ventricular Rate:  110 PR Interval:  158 QRS Duration:  106 QT Interval:  399 QTC Calculation: 540 R Axis:   25  Text Interpretation: Sinus tachycardia Ventricular premature complex Low voltage, precordial leads Nonspecific T abnormalities, anterior leads Prolonged QT interval Artifact in lead(s) I II III aVR aVL aVF and baseline wander in lead(s) V3 Confirmed by Eber Hong ([id number]) on 09/04/2022 10:11:44 PM  Radiology No results found.  Procedures Procedures    Medications Ordered in ED Medications  diazepam (VALIUM) injection 5 mg (5 mg Intravenous Given 09/04/22 1525)  ondansetron (ZOFRAN) injection 4 mg (4 mg Intravenous Given 09/04/22 1659)  haloperidol lactate (HALDOL) injection 5 mg (5 mg Intravenous Given 09/04/22 1746)    ED Course/ Medical Decision Making/ A&P Clinical Course as of 09/04/22 2252  Sat Sep 04, 2022  1534 Patient reevaluated, still tachycardic, EKG does not show A-fib in fact it shows what appears to be sinus tachycardia with some flattening of the T waves.  Getting Valium for the overstimulation and sympathomimetic appearance of the patient [BM]  2111 Patient is been reevaluated again, she is now much more calm, she states that she feels a little bit paranoid, she lives by herself, her vital signs have now normalized and her heart rate is 95 bpm with a blood pressure of 104/70.  She is no longer cycling her legs [BM]    Clinical Course User Index [BM] Eber Hong, MD                             Medical Decision  Making Amount and/or Complexity of Data Reviewed Labs: ordered. ECG/medicine tests: ordered.  Risk Prescription drug management.    This patient presents to the ED for concern of some altered mental status and significant motor dysfunction after taking a new CBD containing gummy differential diagnosis includes overdose, hyperstimulation, sympathomimetic, could be coingestants    Additional history obtained:  Additional history obtained from medical record External records from outside source obtained and reviewed including psych [facility] records from behavioral health   Lab Tests:  I Ordered, and personally interpreted labs.  The pertinent results include: CBC and metabolic panel which been unremarkable, drug screen not surprisingly positive for marijuana and benzodiazepines however the patient was given Valium which is likely why that came positive    Medicines ordered and prescription drug management:  I ordered medication including Valium for severe agitation Reevaluation of the patient after these medicines showed that the patient significant improvement I have reviewed the patients home medicines and have made adjustments as needed   Problem List / ED Course:  The patient was reevaluated several times while in the emergency department, she continued to improve, ambulated around without difficulty, she feels much better no longer has the involuntary movements of the legs.  I counseled the patient at length regarding the use of these drugs that contain these products, she is agreeable to not take it anymore.  She is stable for discharge, she did not drive herself, she will need a ride going home   Social Determinants of Health:   Drug use          Final Clinical Impression(s) / ED Diagnoses Final diagnoses:  Accidental drug ingestion, initial encounter    Rx / DC Orders ED Discharge Orders  Ordered    ondansetron (ZOFRAN-ODT) 4 MG disintegrating  tablet  Every 8 hours PRN        09/04/22 2249              Eber Hong, MD 09/04/22 2252

## 2022-09-04 NOTE — ED Notes (Addendum)
Poison Control Contacted. Recommendations include supportive care, fluids and benzodiazepines as needed, and observation dependent on patient condition.

## 2022-09-04 NOTE — ED Notes (Signed)
Spoke with Angelique Blonder from poison control. Provided updated vital signs and patient status

## 2022-09-04 NOTE — ED Notes (Signed)
Patient wanted refill fo xanax.  Patient informed that we do not refill controlled substances.  Patient opted to leave.

## 2022-09-06 ENCOUNTER — Encounter (HOSPITAL_COMMUNITY): Payer: Self-pay | Admitting: Psychiatry

## 2022-09-06 ENCOUNTER — Telehealth (INDEPENDENT_AMBULATORY_CARE_PROVIDER_SITE_OTHER): Payer: Medicaid Other | Admitting: Psychiatry

## 2022-09-06 DIAGNOSIS — R7401 Elevation of levels of liver transaminase levels: Secondary | ICD-10-CM

## 2022-09-06 DIAGNOSIS — F1320 Sedative, hypnotic or anxiolytic dependence, uncomplicated: Secondary | ICD-10-CM | POA: Diagnosis not present

## 2022-09-06 DIAGNOSIS — Z79899 Other long term (current) drug therapy: Secondary | ICD-10-CM | POA: Diagnosis not present

## 2022-09-06 DIAGNOSIS — G4733 Obstructive sleep apnea (adult) (pediatric): Secondary | ICD-10-CM

## 2022-09-06 DIAGNOSIS — F5104 Psychophysiologic insomnia: Secondary | ICD-10-CM

## 2022-09-06 DIAGNOSIS — F1290 Cannabis use, unspecified, uncomplicated: Secondary | ICD-10-CM

## 2022-09-06 DIAGNOSIS — F333 Major depressive disorder, recurrent, severe with psychotic symptoms: Secondary | ICD-10-CM

## 2022-09-06 DIAGNOSIS — F1390 Sedative, hypnotic, or anxiolytic use, unspecified, uncomplicated: Secondary | ICD-10-CM | POA: Insufficient documentation

## 2022-09-06 DIAGNOSIS — F4310 Post-traumatic stress disorder, unspecified: Secondary | ICD-10-CM

## 2022-09-06 DIAGNOSIS — B20 Human immunodeficiency virus [HIV] disease: Secondary | ICD-10-CM

## 2022-09-06 DIAGNOSIS — F410 Panic disorder [episodic paroxysmal anxiety] without agoraphobia: Secondary | ICD-10-CM

## 2022-09-06 DIAGNOSIS — F411 Generalized anxiety disorder: Secondary | ICD-10-CM

## 2022-09-06 NOTE — Patient Instructions (Signed)
I do still recommend that you go to the hospital in order to safely come off of Xanax and do not recommend mixing it with any illicit substances including CBD Gummies.  You have refills on the medications that were previously prescribed for you but we are terminating care today per your preference.  You can go to day [facility] recovery [facility] in [place] during their walk-in hours or get a referral from your PCP for compassionate health care in [place].
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
## 2022-09-07 ENCOUNTER — Telehealth: Payer: Self-pay

## 2022-09-07 NOTE — Telephone Encounter (Signed)
Received fax from [facility] regarding drug drug interaction. Interaction is between [place] and Lurasidome Hydochloride. Strong cytochrome inhibitors may increase the plasma concentration and pharmacologic effects of lurasidome.

## 2022-09-08 DIAGNOSIS — Z79899 Other long term (current) drug therapy: Secondary | ICD-10-CM | POA: Diagnosis not present

## 2022-09-08 DIAGNOSIS — E782 Mixed hyperlipidemia: Secondary | ICD-10-CM | POA: Diagnosis not present

## 2022-09-08 DIAGNOSIS — F419 Anxiety disorder, unspecified: Secondary | ICD-10-CM | POA: Diagnosis not present

## 2022-09-08 NOTE — Telephone Encounter (Signed)
Yes, unfortunately it is contraindicated to take Genvoya with Latuda as it can greatly increase levels of Latuda. It would be better for her to come off of Genvoya and switch to something else since [place] can potentially interact with many different mental health medications.   I know appointments are limited but can we get her in to see Dr. Luciana Axe or Marchelle Folks or me at some point to switch? Thank you!

## 2022-09-08 NOTE — Telephone Encounter (Signed)
FYI: Pt scheduled to see you tomorrow to discuss medication change.  Juanita Laster, RMA

## 2022-09-08 NOTE — Telephone Encounter (Signed)
Potential for other drug interactions with Genvoya too - xanax, lipitor, lexapro, and estradiol. FYI. Thanks!  Kendryck Lacroix L. Yandel Zeiner, PharmD [facility] Clinical Pharmacist Practitioner

## 2022-09-09 ENCOUNTER — Ambulatory Visit: Payer: Medicaid Other | Admitting: Internal Medicine

## 2022-09-09 ENCOUNTER — Telehealth: Payer: Self-pay

## 2022-09-09 NOTE — Telephone Encounter (Signed)
Attempted to call patient after no show on 09/09/22 but had to leave a message.  Per Dr Luciana Axe: Patient needs to be rescheduled with Pharmacy due to interactions with a few of her meds. I have notified Marchelle Folks of this and will attach Cassie as well.

## 2022-09-10 NOTE — Telephone Encounter (Signed)
Called patient back this morning to schedule an appointment with pharmacy. When I offered to reschedule her appointment, she said "no thank you". I then reviewed she has a medication Kasandra Knudsen) that is contraindicated with her Genvoya and that we need to switch her medications. She then said "I know that" and hung up the phone. Would prefer patient switch to Biktarvy to avoid DDI with Latuda. She will need to engage with [facility] in order to have appointment, though it looks like she is not interested in setting up a visit with [place]. Marchelle Folks

## 2022-09-14 NOTE — Telephone Encounter (Signed)
Left message with [facility] asking that they hold all Genvoya refills per Dr Staci Righter and do not fill unless it is a new RX as patient MUST be seen due to risks associated with her medications. Patient notified and refused visit.

## 2022-09-15 DIAGNOSIS — E782 Mixed hyperlipidemia: Secondary | ICD-10-CM | POA: Diagnosis not present

## 2022-09-15 DIAGNOSIS — G2581 Restless legs syndrome: Secondary | ICD-10-CM | POA: Diagnosis not present

## 2022-09-23 ENCOUNTER — Telehealth (HOSPITAL_COMMUNITY): Payer: Medicaid Other | Admitting: Psychiatry

## 2022-09-23 DIAGNOSIS — B009 Herpesviral infection, unspecified: Secondary | ICD-10-CM | POA: Diagnosis not present

## 2022-09-23 DIAGNOSIS — F419 Anxiety disorder, unspecified: Secondary | ICD-10-CM | POA: Diagnosis not present

## 2022-09-23 DIAGNOSIS — N3281 Overactive bladder: Secondary | ICD-10-CM | POA: Diagnosis not present

## 2022-09-23 DIAGNOSIS — G4733 Obstructive sleep apnea (adult) (pediatric): Secondary | ICD-10-CM | POA: Diagnosis not present

## 2022-09-23 DIAGNOSIS — R600 Localized edema: Secondary | ICD-10-CM | POA: Diagnosis not present

## 2022-09-27 ENCOUNTER — Ambulatory Visit (HOSPITAL_COMMUNITY)
Admission: EM | Admit: 2022-09-27 | Discharge: 2022-09-27 | Disposition: A | Discharge status: Home / Self Care | Payer: Medicaid Other | Attending: Psychiatry | Admitting: Psychiatry

## 2022-09-27 DIAGNOSIS — R4589 Other symptoms and signs involving emotional state: Secondary | ICD-10-CM

## 2022-09-27 DIAGNOSIS — Z91148 Patient's other noncompliance with medication regimen for other reason: Secondary | ICD-10-CM | POA: Insufficient documentation

## 2022-09-27 MED ORDER — HYDROXYZINE HCL 25 MG PO TABS
25.0000 mg | ORAL_TABLET | ORAL | Status: AC
  Administered 2022-09-27: 25 mg via ORAL
  Filled 2022-09-27: qty 1

## 2022-09-27 NOTE — Progress Notes (Signed)
   09/27/22 2012  [facility] Triage Screening (Walk-ins at [facility] only)  How Did You Hear About [place]? Family/Friend  What Is the Reason for Your Visit/Call Today? Patient presents to [facility] voluntarily. Pt reports having withdrawls from Xanax and is not taking them illegally but to treat anxiety and depression. Pt reports nausea, vommiting, and chills. Pt denies SI/HI at this time. Pt does reports paranoia for being out of the home. Pt is not taking xanax consitently and has ran out of medications and prescription will not be filled until wednesday. Pt reports extreme anxiety and has not taking medications since last friday. Pt is routine.  How Long Has This Been Causing You Problems? <Week  Have You Recently Had Any Thoughts About Hurting Yourself? No  Are You Planning to Commit Suicide/Harm Yourself At This time? No  Have you Recently Had Thoughts About Hurting Someone Karolee Ohs? No  Are You Planning To Harm Someone At This Time? No  Are you currently experiencing any auditory, visual or other hallucinations? Yes  Please explain the hallucinations you are currently experiencing: Patient reports being paranoid and not liking to be out in the dark.  Have You Used Any Alcohol or Drugs in the Past 24 Hours? No  Do you have any current medical co-morbidities that require immediate attention? Yes  Please describe current medical co-morbidities that require immediate attention: HIV, COPD, HPV, High cholestorol  Clinician description of patient physical appearance/behavior: Patient is anxious and fidgety failry groomed and cooperative.  What Do You Feel Would Help You the Most Today? Medication(s)  If access to [facility] was not available, would you have sought care in the Emergency Department? Yes  Determination of Need Routine (7 days)  Options For Referral Medication Management

## 2022-09-27 NOTE — Discharge Instructions (Signed)
F/u with medication provider

## 2022-09-27 NOTE — ED Provider Notes (Signed)
Behavioral Health Urgent Care Medical Screening Exam  Patient Name: Chelsey Henson MRN: [id number] Date of Evaluation: 09/27/22 Chief Complaint:  medication noncompliance.   Diagnosis:  Final diagnoses:  H/O medication noncompliance  Anxious appearance    History of Present illness: Chelsey Henson is a 52 y.o. female.  With a history of PTSD, depression, anxiety, insomnia.  Presented to [facility] voluntarily.  Per the patient " I just need to get some more Xanax still I can get some from my PCP.  When asked why does she need a refill of Xanax patient stated she use them up.  A review of patient PDMP show patient was prescribed 75 Xanax at 30-day supply on 09/06/2022 2 days only 09/27/2022 the patient has exhausted all her prescribed Xanax.  Review of patient records also show patient does have a history of doing the same thing multiple times.  According to patient she lives alone unemployed.   Face-to-face observation of patient, patient is alert and oriented x 4, speech is clear, maintaining eye contact.  Patient appears to be pleasant, very jovial during the interview process.  Patient denies SI, HI, AVH or paranoia at this time.  Patient denies alcohol use denies illicit drug use denies smoking.  Patient does appear to be seeking secondary gain by asking for Xanax does appear that patient is misusing her Xanax and is not compliant with her regiment.  Writer discussed with patient that she needs to reach out to her PCP or a provider who prescribed her Xanax and have a conversation with them however at this time I will not be able to prescribe her any narcotics.  However I did give the patient options to receive one-time dose of hydroxyzine.  At this time patient does not seem to be influenced by external or internal stimuli.  Patient was advised to call 911 or show up to them clear nearest ED should she experience any suicidal ideation hallucination or deterioration in her mental health. At the time of  this assessment patient was both mentally and medically clear for discharge patient needs to follow up with their primary care or her psychiatric outpatient services.   Recommend discharge the patient to follow-up with outpatient resources  Flowsheet Row ED from 09/27/2022 in [facility] Most recent reading at 09/27/2022  8:17 PM ED from 09/04/2022 in [facility] Emergency Department at [facility] Most recent reading at 09/04/2022  3:06 PM ED from 09/04/2022 in [facility] [place] Most recent reading at 09/04/2022 11:42 AM  C-SSRS RISK CATEGORY Low Risk No Risk No Risk       Psychiatric Specialty Exam  Presentation  General Appearance:Casual  Eye Contact:Good  Speech:Clear and Coherent  Speech Volume:Decreased  Handedness:Right   Mood and Affect  Mood: Anxious  Affect: Congruent   Thought Process  Thought Processes: Coherent  Descriptions of Associations:Circumstantial  Orientation:Full (Time, Place and Person)  Thought Content:Logical  Diagnosis of Schizophrenia or Schizoaffective disorder in past: No   Hallucinations:None  Ideas of Reference:None  Suicidal Thoughts:No  Homicidal Thoughts:No   Sensorium  Memory: Immediate Good  Judgment: Fair  Insight: Fair   Art therapist  Concentration: Good  Attention Span: Good  Recall: Good  Fund of Knowledge: Good  Language: Good   Psychomotor Activity  Psychomotor Activity: Normal   Assets  Assets: Desire for Improvement   Sleep  Sleep: Fair  Number of hours:  5   Physical Exam: Physical Exam HENT:  Head: Normocephalic.     Nose: Nose normal.  Cardiovascular:     Rate and Rhythm: Normal rate.  Pulmonary:     Effort: Pulmonary effort is normal.  Musculoskeletal:        General: Normal range of motion.     Cervical back: Normal range of motion.  Neurological:     General: No focal deficit present.     Mental  Status: She is alert.  Psychiatric:        Mood and Affect: Mood normal.        Behavior: Behavior normal.    Review of Systems  Constitutional: Negative.   HENT: Negative.    Eyes: Negative.   Respiratory: Negative.    Cardiovascular: Negative.   Gastrointestinal: Negative.   Genitourinary: Negative.   Musculoskeletal: Negative.   Skin: Negative.   Neurological: Negative.   Psychiatric/Behavioral:  Positive for substance abuse. The patient is nervous/anxious.    Blood pressure (!) 133/93, pulse 83, temperature 98 F (36.7 C), resp. rate 18, last menstrual period 09/30/2014, SpO2 97%. There is no height or weight on file to calculate BMI.  Musculoskeletal: Strength & Muscle Tone: within normal limits Gait & Station: normal Patient leans: N/A   [facility] MSE Discharge Disposition for Follow up and Recommendations: Based on my evaluation the patient does not appear to have an emergency medical condition and can be discharged with resources and follow up care in outpatient services for Medication Management   Sindy Guadeloupe, NP 09/27/2022, 8:43 PM

## 2022-09-27 NOTE — ED Triage Notes (Signed)
Patient presents to [facility] voluntarily. Pt reports having withdrawls from Xanax and is not taking them illegally but to treat anxiety and depression. Pt reports nausea, vommiting, and chills. Pt denies SI/HI at this time. Pt does reports paranoia for being out of the home. Pt is not taking xanax consitently and has ran out of medications and prescription will not be filled until wednesday. Pt reports extreme anxiety and has not taking medications since last friday. Pt is routine.

## 2022-09-28 ENCOUNTER — Emergency Department (HOSPITAL_COMMUNITY)
Admission: EM | Admit: 2022-09-28 | Discharge: 2022-09-28 | Disposition: A | Discharge status: Home / Self Care | Payer: Medicaid Other | Attending: Emergency Medicine | Admitting: Emergency Medicine

## 2022-09-28 ENCOUNTER — Other Ambulatory Visit: Payer: Self-pay

## 2022-09-28 DIAGNOSIS — F419 Anxiety disorder, unspecified: Secondary | ICD-10-CM | POA: Diagnosis not present

## 2022-09-28 DIAGNOSIS — F29 Unspecified psychosis not due to a substance or known physiological condition: Secondary | ICD-10-CM | POA: Diagnosis not present

## 2022-09-28 DIAGNOSIS — R739 Hyperglycemia, unspecified: Secondary | ICD-10-CM | POA: Diagnosis not present

## 2022-09-28 DIAGNOSIS — E876 Hypokalemia: Secondary | ICD-10-CM | POA: Diagnosis not present

## 2022-09-28 DIAGNOSIS — Z21 Asymptomatic human immunodeficiency virus [HIV] infection status: Secondary | ICD-10-CM | POA: Diagnosis not present

## 2022-09-28 DIAGNOSIS — F13239 Sedative, hypnotic or anxiolytic dependence with withdrawal, unspecified: Secondary | ICD-10-CM | POA: Diagnosis not present

## 2022-09-28 DIAGNOSIS — R457 State of emotional shock and stress, unspecified: Secondary | ICD-10-CM | POA: Diagnosis not present

## 2022-09-28 DIAGNOSIS — R7309 Other abnormal glucose: Secondary | ICD-10-CM

## 2022-09-28 DIAGNOSIS — F13930 Sedative, hypnotic or anxiolytic use, unspecified with withdrawal, uncomplicated: Secondary | ICD-10-CM

## 2022-09-28 DIAGNOSIS — R112 Nausea with vomiting, unspecified: Secondary | ICD-10-CM | POA: Insufficient documentation

## 2022-09-28 LAB — CBC WITH DIFFERENTIAL/PLATELET
Abs Immature Granulocytes: 0.03 10*3/uL (ref 0.00–0.07)
Basophils Absolute: 0.1 10*3/uL (ref 0.0–0.1)
Basophils Relative: 1 %
Eosinophils Absolute: 0.1 10*3/uL (ref 0.0–0.5)
Eosinophils Relative: 1 %
HCT: 39.8 % (ref 36.0–46.0)
Hemoglobin: 14 g/dL (ref 12.0–15.0)
Immature Granulocytes: 0 %
Lymphocytes Relative: 25 %
Lymphs Abs: 2.3 10*3/uL (ref 0.7–4.0)
MCH: 30.9 pg (ref 26.0–34.0)
MCHC: 35.2 g/dL (ref 30.0–36.0)
MCV: 87.9 fL (ref 80.0–100.0)
Monocytes Absolute: 0.9 10*3/uL (ref 0.1–1.0)
Monocytes Relative: 9 %
Neutro Abs: 6 10*3/uL (ref 1.7–7.7)
Neutrophils Relative %: 64 %
Platelets: 229 10*3/uL (ref 150–400)
RBC: 4.53 MIL/uL (ref 3.87–5.11)
RDW: 13.9 % (ref 11.5–15.5)
WBC: 9.3 10*3/uL (ref 4.0–10.5)
nRBC: 0 % (ref 0.0–0.2)

## 2022-09-28 LAB — BASIC METABOLIC PANEL
Anion gap: 6 (ref 5–15)
BUN: 15 mg/dL (ref 6–20)
CO2: 22 mmol/L (ref 22–32)
Calcium: 9.2 mg/dL (ref 8.9–10.3)
Chloride: 109 mmol/L (ref 98–111)
Creatinine, Ser: 0.6 mg/dL (ref 0.44–1.00)
GFR, Estimated: >60 mL/min (ref >60)
Glucose, Bld: 121 mg/dL — ABNORMAL HIGH (ref 70–99)
Potassium: 3.2 mmol/L — ABNORMAL LOW (ref 3.5–5.1)
Sodium: 137 mmol/L (ref 135–145)

## 2022-09-28 MED ORDER — LORAZEPAM 1 MG PO TABS
1.0000 mg | ORAL_TABLET | Freq: Once | ORAL | Status: AC
  Administered 2022-09-28: 1 mg via ORAL
  Filled 2022-09-28: qty 1

## 2022-09-28 MED ORDER — ZOLPIDEM TARTRATE 5 MG PO TABS: 5.0000 mg | ORAL_TABLET | Freq: Every evening | ORAL | 0 refills | Status: AC | PRN

## 2022-09-28 MED ORDER — SODIUM CHLORIDE 0.9 % IV BOLUS
1000.0000 mL | Freq: Once | INTRAVENOUS | Status: AC
  Administered 2022-09-28: 1000 mL via INTRAVENOUS

## 2022-09-28 MED ORDER — POTASSIUM CHLORIDE CRYS ER 20 MEQ PO TBCR
40.0000 meq | EXTENDED_RELEASE_TABLET | Freq: Once | ORAL | Status: AC
  Administered 2022-09-28: 40 meq via ORAL
  Filled 2022-09-28: qty 2

## 2022-09-28 MED ORDER — POTASSIUM CHLORIDE CRYS ER 20 MEQ PO TBCR: 20.0000 meq | EXTENDED_RELEASE_TABLET | Freq: Two times a day (BID) | ORAL | 0 refills | Status: AC

## 2022-09-28 MED ORDER — ONDANSETRON HCL 4 MG/2ML IJ SOLN
4.0000 mg | Freq: Once | INTRAMUSCULAR | Status: AC
  Administered 2022-09-28: 4 mg via INTRAVENOUS
  Filled 2022-09-28: qty 2

## 2022-09-28 MED ORDER — ONDANSETRON 8 MG PO TBDP: 8.0000 mg | ORAL_TABLET | Freq: Three times a day (TID) | ORAL | 0 refills | Status: AC | PRN

## 2022-09-28 NOTE — ED Notes (Signed)
Chelsey Henson ED MD aware of pt COWS score advises patient is safe for DC and to follow up with PCP.

## 2022-09-28 NOTE — ED Triage Notes (Addendum)
Pt bib [facility] c/o anxiety attack, reports she has ran out of Xanax. Pt states she went to [facility] and they "gave her something" and d/c'd her. But is not able to get her Xanax refilled until Wednesday. Denies SI/HI.

## 2022-09-28 NOTE — ED Provider Notes (Signed)
[place] EMERGENCY DEPARTMENT AT [facility] Provider Note   CSN: [id number] Arrival date & time: 09/28/22  0057     History  Chief Complaint  Patient presents with   Panic Attack    Chelsey Henson is a 52 y.o. female.  The history is provided by the patient.  She has history of hyperlipidemia, HIV, anxiety and states that she has been taking more of her alprazolam that she is supposed to and she ran out 4 days ago.  She states she cannot get the prescription filled refilled until later this week and she wants something to help.  She also states that she is hurting everywhere and she has been having nausea and vomiting for the last 3 days and has not been able to hold anything down.  She has had subjective fever as well as chills and sweats.  She denies constipation or diarrhea.  She had gone to behavioral health urgent care where she received something that was supposed to help with anxiety, but she states that it has not helped.   Home Medications Prior to Admission medications   Medication Sig Start Date End Date Taking? Authorizing Provider  acyclovir (ZOVIRAX) 800 MG tablet TAKE (1) TABLET BY MOUTH ONCE DAILY. Patient taking differently: 800 mg daily. 07/18/20   Heather Roberts, NP  albuterol (VENTOLIN HFA) 108 (90 Base) MCG/ACT inhaler Inhale 2 puffs into the lungs every 4 (four) hours as needed for wheezing or shortness of breath. 07/02/22   Raspet, Noberto Retort, PA-C  ALPRAZolam Prudy Feeler) 0.5 MG tablet Take half a tablet in the morning, full tablet at lunch and bed time. 08/24/22   Elsie Lincoln, MD  atorvastatin (LIPITOR) 40 MG tablet Take 40 mg by mouth every evening.    [provider]  elvitegravir-cobicistat-emtricitabine-tenofovir (GENVOYA) 150-150-200-10 MG TABS tablet Take 1 tablet by mouth daily with breakfast. 05/25/22   Comer, Belia Heman, MD  escitalopram (LEXAPRO) 20 MG tablet TAKE (1) TABLET BY MOUTH ONCE DAILY. Patient taking differently: Take 20 mg by  mouth daily. 06/12/20   Heather Roberts, NP  estradiol (ESTRACE) 1 MG tablet Take 1 mg by mouth at bedtime.    [provider]  lurasidone (LATUDA) 20 MG TABS tablet Take 1 tablet (20 mg total) by mouth at bedtime. 08/24/22 10/23/22  Elsie Lincoln, MD  nystatin cream (MYCOSTATIN) Apply 1 Application topically 2 (two) times daily as needed for dry skin (Apply to affected area).    [provider]  omeprazole (PRILOSEC) 20 MG capsule Take 20 mg by mouth daily. 02/02/22   [provider]  ondansetron (ZOFRAN-ODT) 4 MG disintegrating tablet Take 1 tablet (4 mg total) by mouth every 8 (eight) hours as needed for nausea. 09/04/22   Eber Hong, MD  Tiotropium Bromide Monohydrate (SPIRIVA RESPIMAT) 2.5 MCG/ACT AERS Inhale 2 puffs into the lungs daily. 07/02/22   Raspet, Noberto Retort, PA-C  Vibegron (GEMTESA) 75 MG TABS Take 75 mg by mouth daily. 09/07/21   Stoneking, Danford Bad., MD  budesonide-formoterol (SYMBICORT) 160-4.5 MCG/ACT inhaler Inhale 2 puffs into the lungs 2 (two) times daily.  09/25/18  [provider]  montelukast (SINGULAIR) 10 MG tablet Take 1 tablet (10 mg total) by mouth at bedtime. 07/26/17 09/25/18  Gardiner Barefoot, MD      Allergies    Aripiprazole and Codeine    Review of Systems   Review of Systems  All other systems reviewed and are negative.   Physical Exam Updated  Vital Signs BP (!) 149/81   Pulse 91   Temp 98.7 F (37.1 C)   Resp (!) 22   Ht 5\' 4"  (1.626 m)   Wt 106 kg   LMP 09/30/2014 (Approximate)   SpO2 98%   BMI 40.11 kg/m  Physical Exam Vitals and nursing note reviewed.   52 year old female, somewhat anxious appearing, but in no acute distress. Vital signs are significant for elevated blood pressure and slightly elevated respiratory rate. Oxygen saturation is 98%, which is normal. Head is normocephalic and atraumatic. PERRLA, EOMI. Oropharynx is clear. Neck is nontender and supple without adenopathy or JVD. Back is nontender and  there is no CVA tenderness. Lungs are clear without rales, wheezes, or rhonchi. Chest is nontender. Heart has regular rate and rhythm without murmur. Abdomen is soft, flat, nontender.  Peristalsis is hypoactive. Extremities have no cyanosis or edema, full range of motion is present. Skin is warm and dry without rash. Neurologic: Mental status is normal, cranial nerves are intact, moves all extremities equally.  ED Results / Procedures / Treatments   Labs (all labs ordered are listed, but only abnormal results are displayed) Labs Reviewed  BASIC METABOLIC PANEL - Abnormal; Notable for the following components:      Result Value   Potassium 3.2 (*)    Glucose, Bld 121 (*)    All other components within normal limits  CBC WITH DIFFERENTIAL/PLATELET    Procedures Procedures    Medications Ordered in ED Medications  LORazepam (ATIVAN) tablet 1 mg (has no administration in time range)  sodium chloride 0.9 % bolus 1,000 mL (1,000 mLs Intravenous New Bag/Given 09/28/22 0622)  ondansetron (ZOFRAN) injection 4 mg (4 mg Intravenous Given 09/28/22 0622)  LORazepam (ATIVAN) tablet 1 mg (1 mg Oral Given 09/28/22 0617)  potassium chloride SA (KLOR-CON M) CR tablet 40 mEq (40 mEq Oral Given 09/28/22 9528)    ED Course/ Medical Decision Making/ A&P                             Medical Decision Making Amount and/or Complexity of Data Reviewed Labs: ordered.  Risk Prescription drug management.   Symptom complex likely secondary to benzodiazepine withdrawal.  I have reviewed her record on the [place] controlled substance reporting website and she received a 30-day supply of alprazolam on July 8.  She was seen at behavioral health urgent care prior to coming here where she received hydroxyzine 25 mg.  However, nausea and vomiting could be secondary to gastritis, doubt bowel obstruction.  She does not appear dehydrated, but I have elected to give her some IV fluids and I have ordered CBC  and basic metabolic panel.  I have ordered intravenous ondansetron as well as a dose of oral lorazepam.  I have reviewed and interpreted her laboratory tests, and my interpretation is mild hypokalemia, elevated random glucose level, normal CBC.  I have ordered a dose of oral potassium.  Glucose will need to be followed as an outpatient.  She continues to complain of severe anxiety and spite of oral lorazepam.  She is requesting admission for detox, advised that that is not done at this location.  I have ordered a second dose of lorazepam for her and I am discharging her with outpatient resources.  She is requesting something to help her sleep and I have prescribed a small number of zolpidem.  I have also prescribed ondansetron oral dissolving tablet.  Final  Clinical Impression(s) / ED Diagnoses Final diagnoses:  Benzodiazepine withdrawal without complication (HCC)  Anxiety  Nausea and vomiting, unspecified vomiting type  Hypokalemia  Elevated random blood glucose level    Rx / DC Orders ED Discharge Orders          Ordered    potassium chloride SA (KLOR-CON M) 20 MEQ tablet  2 times daily        09/28/22 0739    ondansetron (ZOFRAN-ODT) 8 MG disintegrating tablet  Every 8 hours PRN        09/28/22 0739    zolpidem (AMBIEN) 5 MG tablet  At bedtime PRN        09/28/22 0740              Dione Booze, MD 09/28/22 [phone]

## 2022-09-28 NOTE — ED Notes (Signed)
Pt ambulatory at dc verbalizes dc teaching and rx pick up as well as follow up with pcp. Pt given community resources for counseling and substance abuse as well. States that she will be driven home by her ex-husband.

## 2022-09-28 NOTE — ED Triage Notes (Signed)
Pt brought in by EMS after she called c/o anxiety. Pt told EMS she has run out of her Xanax and is not able to get a refill until Wednesday.

## 2022-09-28 NOTE — ED Notes (Signed)
Pt restless in bed, not on monitor r/t constant wiggling. Pt also endorses taking her anxiety medication more that she should at home reports taking about 3 pills a day but states that she has been out since Friday. States increase in anxiety is related to ongoing issues with ex-husband.

## 2022-09-29 DIAGNOSIS — F329 Major depressive disorder, single episode, unspecified: Secondary | ICD-10-CM | POA: Diagnosis not present

## 2022-09-30 DIAGNOSIS — F322 Major depressive disorder, single episode, severe without psychotic features: Secondary | ICD-10-CM | POA: Diagnosis not present

## 2022-10-01 ENCOUNTER — Other Ambulatory Visit: Payer: Self-pay

## 2022-10-01 ENCOUNTER — Telehealth: Payer: Self-pay

## 2022-10-01 DIAGNOSIS — F322 Major depressive disorder, single episode, severe without psychotic features: Secondary | ICD-10-CM | POA: Diagnosis not present

## 2022-10-01 DIAGNOSIS — F329 Major depressive disorder, single episode, unspecified: Secondary | ICD-10-CM | POA: Diagnosis not present

## 2022-10-01 DIAGNOSIS — B20 Human immunodeficiency virus [HIV] disease: Secondary | ICD-10-CM

## 2022-10-01 DIAGNOSIS — Z113 Encounter for screening for infections with a predominantly sexual mode of transmission: Secondary | ICD-10-CM

## 2022-10-01 NOTE — Telephone Encounter (Signed)
..   Medicaid Managed Care   Unsuccessful Outreach Note  10/01/2022 Name: Chelsey Henson MRN: [id number] DOB: [birth year]  Referred by: Benita Stabile, MD Reason for referral : Appointment   An unsuccessful telephone outreach was attempted today. The patient was referred to the case management team for assistance with care management and care coordination.   Follow Up Plan: A HIPAA compliant phone message was left for the patient providing contact information and requesting a return call.  The care management team will reach out to the patient again over the next 7 days.   Weston Settle Care Guide  [facility] Managed  Care Guide [facility]  [phone]

## 2022-10-03 DIAGNOSIS — F322 Major depressive disorder, single episode, severe without psychotic features: Secondary | ICD-10-CM | POA: Diagnosis not present

## 2022-10-04 ENCOUNTER — Other Ambulatory Visit: Payer: Medicaid Other

## 2022-10-06 ENCOUNTER — Ambulatory Visit
Admission: EM | Admit: 2022-10-06 | Discharge: 2022-10-06 | Disposition: A | Discharge status: Home / Self Care | Payer: Medicaid Other | Attending: Nurse Practitioner | Admitting: Nurse Practitioner

## 2022-10-06 DIAGNOSIS — R399 Unspecified symptoms and signs involving the genitourinary system: Secondary | ICD-10-CM | POA: Insufficient documentation

## 2022-10-06 LAB — POCT URINALYSIS DIP (MANUAL ENTRY)
Bilirubin, UA: NEGATIVE
Blood, UA: NEGATIVE
Glucose, UA: NEGATIVE mg/dL
Ketones, POC UA: NEGATIVE mg/dL
Nitrite, UA: NEGATIVE
Protein Ur, POC: NEGATIVE mg/dL
Spec Grav, UA: 1.025 (ref 1.010–1.025)
Urobilinogen, UA: >=8 E.U./dL — AB
pH, UA: 6 (ref 5.0–8.0)

## 2022-10-06 MED ORDER — NITROFURANTOIN MONOHYD MACRO 100 MG PO CAPS: 100.0000 mg | ORAL_CAPSULE | Freq: Two times a day (BID) | ORAL | 0 refills | Status: AC

## 2022-10-06 NOTE — Discharge Instructions (Addendum)
Urine culture is pending.  You will be contacted if the urine culture result is negative.  You will also be contacted if the results of the culture showed that your medication needs to be changed. Take medication as prescribed. Increase fluids and allow for plenty of rest.  Try to drink at least 8-10 8 ounce glasses of water while symptoms persist. Make sure you are urinating every 2 hours. Avoid caffeine such as tea, soda, and coffee while symptoms persist. If your urine culture result is negative, and you are continuing to experience symptoms, please follow-up with your primary care physician for further evaluation. If you experience worsening symptoms to include fever, chills, low back pain, or other concerns, please follow-up in the emergency department for further evaluation. Follow-up as needed.

## 2022-10-06 NOTE — ED Provider Notes (Signed)
[facility]    CSN: [id number] Arrival date & time: 10/06/22  1321      History   Chief Complaint No chief complaint on file.   HPI Chelsey Henson is a 52 y.o. female.   The history is provided by the patient.   The patient presents with a 2-day history of pain with urination, lower abdominal pain, and urinary frequency.  Patient denies fever, chills, chest pain, nausea, vomiting, diarrhea, hematuria, decreased urine stream, low back pain, flank pain, or vaginal symptoms.  Patient denies history of recurrent UTI, kidney stones, or kidney infection. Past Medical History:  Diagnosis Date   Anemia    Anxiety    Asthma    COPD (chronic obstructive pulmonary disease) (HCC) 04/05/2016   Phreesia 05/28/2020   Depression    Depression    Phreesia 05/28/2020   Genital herpes    GERD (gastroesophageal reflux disease)    HIV (human immunodeficiency virus infection) (HCC) 11/24/2017   HIV infection (HCC) 2008   Hypertension    Routine screening for STI (sexually transmitted infection) 01/19/2022   Sleep apnea    Substance abuse (HCC)    cocaine- last use was Oct 2009   Vaginal Pap smear, abnormal     Patient Active Problem List   Diagnosis Date Noted   Episodic cannabis use 09/06/2022   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   Benzodiazepine misuse: Ran out of month supply after <2 weeks 09/06/2022   Long-term current use of benzodiazepine 08/24/2022   Routine screening for STI (sexually transmitted infection) 01/19/2022   Lipoma of buttock    Rash 07/23/2020   Post traumatic stress disorder (PTSD) 04/30/2019   Generalized anxiety disorder with panic attacks 04/30/2019   Encounter to establish care 10/27/2018   Transaminitis 05/24/2017   Gastroesophageal reflux disease without esophagitis 04/06/2017   Major depressive disorder, recurrent episode, severe, 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   Cocaine use disorder, moderate, in sustained remission (HCC)  09/08/2016   Obstructive sleep apnea (adult) (pediatric) 04/15/2016   Asthma 04/15/2016   HIV (human immunodeficiency virus infection) (HCC) 04/05/2016   Essential hypertension 04/05/2016   Psychophysiologic insomnia with sleep apnea not on CPAP 04/05/2016    Past Surgical History:  Procedure Laterality Date   ABDOMINAL HYSTERECTOMY     BENIGN MASS; total hysterectomy   CESAREAN SECTION     ESOPHAGOGASTRODUODENOSCOPY N/A 06/30/2017   Procedure: ESOPHAGOGASTRODUODENOSCOPY (EGD);  Surgeon: Malissa Hippo, MD;  Location: [facility];  Service: Endoscopy;  Laterality: N/A;  2:00   HERNIA REPAIR  2017   LIPOMA EXCISION Left 08/14/2021   Procedure: EXCISION LIPOMA, LEFT BUTTOCKS;  Surgeon: Franky Macho, MD;  Location: [facility];  Service: General;  Laterality: Left;    OB History     Gravida  6   Para  4   Term  4   Preterm      AB  2   Living  4      SAB  2   IAB      Ectopic      Multiple      Live Births               Home Medications    Prior to Admission medications   Medication Sig Start Date End Date Taking? Authorizing Provider  nitrofurantoin, macrocrystal-monohydrate, (MACROBID) 100 MG capsule Take 1 capsule (100 mg total) by mouth 2 (two) times daily. 10/06/22  Yes -Warren, Sadie Haber, NP  acyclovir (ZOVIRAX) 800 MG tablet  TAKE (1) TABLET BY MOUTH ONCE DAILY. Patient taking differently: 800 mg daily. 07/18/20   Heather Roberts, NP  albuterol (VENTOLIN HFA) 108 (90 Base) MCG/ACT inhaler Inhale 2 puffs into the lungs every 4 (four) hours as needed for wheezing or shortness of breath. 07/02/22   Raspet, Noberto Retort, PA-C  ALPRAZolam Prudy Feeler) 0.5 MG tablet Take half a tablet in the morning, full tablet at lunch and bed time. 08/24/22   Elsie Lincoln, MD  atorvastatin (LIPITOR) 40 MG tablet Take 40 mg by mouth every evening.    [provider]  elvitegravir-cobicistat-emtricitabine-tenofovir (GENVOYA) 150-150-200-10 MG TABS tablet Take 1 tablet by  mouth daily with breakfast. 05/25/22   Comer, Belia Heman, MD  escitalopram (LEXAPRO) 20 MG tablet TAKE (1) TABLET BY MOUTH ONCE DAILY. Patient taking differently: Take 20 mg by mouth daily. 06/12/20   Heather Roberts, NP  estradiol (ESTRACE) 1 MG tablet Take 1 mg by mouth at bedtime.    [provider]  lurasidone (LATUDA) 20 MG TABS tablet Take 1 tablet (20 mg total) by mouth at bedtime. 08/24/22 10/23/22  Elsie Lincoln, MD  nystatin cream (MYCOSTATIN) Apply 1 Application topically 2 (two) times daily as needed for dry skin (Apply to affected area).    [provider]  omeprazole (PRILOSEC) 20 MG capsule Take 20 mg by mouth daily. 02/02/22   [provider]  ondansetron (ZOFRAN-ODT) 8 MG disintegrating tablet Take 1 tablet (8 mg total) by mouth every 8 (eight) hours as needed for nausea. 09/28/22   Dione Booze, MD  potassium chloride SA (KLOR-CON M) 20 MEQ tablet Take 1 tablet (20 mEq total) by mouth 2 (two) times daily. 09/28/22   Dione Booze, MD  Tiotropium Bromide Monohydrate (SPIRIVA RESPIMAT) 2.5 MCG/ACT AERS Inhale 2 puffs into the lungs daily. 07/02/22   Raspet, Noberto Retort, PA-C  Vibegron (GEMTESA) 75 MG TABS Take 75 mg by mouth daily. 09/07/21   Stoneking, Danford Bad., MD  zolpidem (AMBIEN) 5 MG tablet Take 1 tablet (5 mg total) by mouth at bedtime as needed for sleep. 09/28/22   Dione Booze, MD  budesonide-formoterol [facility]) 160-4.5 MCG/ACT inhaler Inhale 2 puffs into the lungs 2 (two) times daily.  09/25/18  [provider]  montelukast (SINGULAIR) 10 MG tablet Take 1 tablet (10 mg total) by mouth at bedtime. 07/26/17 09/25/18  Comer, Belia Heman, MD    Family History Family History  Problem Relation Age of Onset   Depression Mother    Hyperlipidemia Mother    Learning disabilities Mother    Alcohol abuse Mother 54   Alcohol abuse Father    Arthritis Father    Cancer Father    Heart disease Father    Hypertension Father    Learning disabilities Father     Alcohol abuse Sister        RECOVERED   Alcohol abuse Brother    Alcohol abuse Brother    Alcohol abuse Brother     Social History Social History   Tobacco Use   Smoking status: Never   Smokeless tobacco: Never  Vaping Use   Vaping status: Never Used  Substance Use Topics   Alcohol use: Not Currently    Alcohol/week: 1.0 standard drink of alcohol    Types: 1 Cans of beer per week    Comment: See psychiatry note from 08/23/2022   Drug use: Yes    Types: Cocaine, Marijuana    Comment: Unintentional overdose of 3 CBD Gummies on 09/04/2022  in the setting of running out of Xanax prescription from overuse     Allergies   Aripiprazole and Codeine   Review of Systems Review of Systems Per HPI  Physical Exam Triage Vital Signs ED Triage Vitals  Encounter Vitals Group     BP 10/06/22 1508 131/81     Systolic BP Percentile --      Diastolic BP Percentile --      Pulse Rate 10/06/22 1508 82     Resp 10/06/22 1508 15     Temp 10/06/22 1508 98.1 F (36.7 C)     Temp Source 10/06/22 1507 Oral     SpO2 10/06/22 1508 96 %     Weight --      Height --      Head Circumference --      Peak Flow --      Pain Score 10/06/22 1508 7     Pain Loc --      Pain Education --      Exclude from Growth Chart --    No data found.  Updated Vital Signs BP 131/81 (BP Location: Right Arm)   Pulse 82   Temp 98.1 F (36.7 C) (Oral)   Resp 15   LMP 09/30/2014 (Approximate)   SpO2 96%   Visual Acuity Right Eye Distance:   Left Eye Distance:   Bilateral Distance:    Right Eye Near:   Left Eye Near:    Bilateral Near:     Physical Exam Vitals and nursing note reviewed.  Constitutional:      General: She is not in acute distress.    Appearance: Normal appearance.  HENT:     Head: Normocephalic.     Mouth/Throat:     Mouth: Mucous membranes are moist.  Eyes:     Extraocular Movements: Extraocular movements intact.     Pupils: Pupils are equal, round, and reactive to light.   Cardiovascular:     Rate and Rhythm: Normal rate and regular rhythm.     Pulses: Normal pulses.     Heart sounds: Normal heart sounds.  Pulmonary:     Effort: Pulmonary effort is normal.     Breath sounds: Normal breath sounds.  Abdominal:     General: Bowel sounds are normal.     Palpations: Abdomen is soft.     Tenderness: There is abdominal tenderness. There is no right CVA tenderness or left CVA tenderness.  Musculoskeletal:     Cervical back: Normal range of motion.  Skin:    General: Skin is warm and dry.  Neurological:     General: No focal deficit present.     Mental Status: She is alert and oriented to person, place, and time.  Psychiatric:        Mood and Affect: Mood normal.        Behavior: Behavior normal.      UC Treatments / Results  Labs (all labs ordered are listed, but only abnormal results are displayed) Labs Reviewed  POCT URINALYSIS DIP (MANUAL ENTRY) - Abnormal; Notable for the following components:      Result Value   Urobilinogen, UA >=8.0 (*)    Leukocytes, UA Small (1+) (*)    All other components within normal limits    EKG   Radiology No results found.  Procedures Procedures (including critical care time)  Medications Ordered in UC Medications - No data to display  Initial Impression / Assessment and Plan / UC Course  I  have reviewed the triage vital signs and the nursing notes.  Pertinent labs & imaging results that were available during my care of the patient were reviewed by me and considered in my medical decision making (see chart for details).  The patient is well-appearing, she is in no acute distress, vital signs are stable.  Urinalysis does not indicate an obvious urinary tract infection.  There are small leukocytes, urine culture is pending.  Will treat patient empirically with Macrobid 100 mg while urine culture is pending.  Supportive care recommendations were provided and discussed with the patient to include increasing  her fluid intake, voiding every 2 hours, and avoiding caffeine.  Patient was advised to go to the emergency department immediately if she experiences worsening urinary tract infection symptoms with new symptoms of fever, chills, or other concerns.  Patient is in agreement with this plan of care and verbalizes understanding.  All questions were answered.  Patient stable for discharge.   Final Clinical Impressions(s) / UC Diagnoses   Final diagnoses:  UTI symptoms     Discharge Instructions      Urine culture is pending.  You will be contacted if the urine culture result is negative.  You will also be contacted if the results of the culture showed that your medication needs to be changed. Take medication as prescribed. Increase fluids and allow for plenty of rest.  Try to drink at least 8-10 8 ounce glasses of water while symptoms persist. Make sure you are urinating every 2 hours. Avoid caffeine such as tea, soda, and coffee while symptoms persist. If your urine culture result is negative, and you are continuing to experience symptoms, please follow-up with your primary care physician for further evaluation. If you experience worsening symptoms to include fever, chills, low back pain, or other concerns, please follow-up in the emergency department for further evaluation. Follow-up as needed.     ED Prescriptions     Medication Sig Dispense Auth. Provider   nitrofurantoin, macrocrystal-monohydrate, (MACROBID) 100 MG capsule Take 1 capsule (100 mg total) by mouth 2 (two) times daily. 10 capsule -Warren, Sadie Haber, NP      PDMP not reviewed this encounter.   Abran Cantor, NP 10/06/22 1553

## 2022-10-06 NOTE — ED Triage Notes (Signed)
Pt c/o possible UTI, painful urination, burning with urination, abdominal pain x 2 days

## 2022-10-08 DIAGNOSIS — Z79899 Other long term (current) drug therapy: Secondary | ICD-10-CM | POA: Diagnosis not present

## 2022-10-08 DIAGNOSIS — Z8659 Personal history of other mental and behavioral disorders: Secondary | ICD-10-CM | POA: Diagnosis not present

## 2022-10-08 DIAGNOSIS — F331 Major depressive disorder, recurrent, moderate: Secondary | ICD-10-CM | POA: Diagnosis not present

## 2022-10-08 DIAGNOSIS — F411 Generalized anxiety disorder: Secondary | ICD-10-CM | POA: Diagnosis not present

## 2022-10-19 ENCOUNTER — Ambulatory Visit: Payer: Medicaid Other | Admitting: Internal Medicine

## 2022-10-20 ENCOUNTER — Other Ambulatory Visit: Payer: Self-pay

## 2022-10-20 ENCOUNTER — Ambulatory Visit (INDEPENDENT_AMBULATORY_CARE_PROVIDER_SITE_OTHER): Payer: Medicaid Other | Admitting: Internal Medicine

## 2022-10-20 ENCOUNTER — Other Ambulatory Visit (HOSPITAL_COMMUNITY)
Admission: RE | Admit: 2022-10-20 | Discharge: 2022-10-20 | Disposition: A | Discharge status: Home / Self Care | Payer: Medicaid Other | Source: Ambulatory Visit | Attending: Internal Medicine | Admitting: Internal Medicine

## 2022-10-20 ENCOUNTER — Encounter: Payer: Self-pay | Admitting: Internal Medicine

## 2022-10-20 VITALS — BP 137/82 | HR 73 | Resp 16 | Ht 64.0 in | Wt 219.0 lb

## 2022-10-20 DIAGNOSIS — Z113 Encounter for screening for infections with a predominantly sexual mode of transmission: Secondary | ICD-10-CM | POA: Diagnosis not present

## 2022-10-20 DIAGNOSIS — B20 Human immunodeficiency virus [HIV] disease: Secondary | ICD-10-CM | POA: Insufficient documentation

## 2022-10-20 DIAGNOSIS — Z5181 Encounter for therapeutic drug level monitoring: Secondary | ICD-10-CM

## 2022-10-20 MED ORDER — BIKTARVY 50-200-25 MG PO TABS: 1.0000 | ORAL_TABLET | Freq: Every day | ORAL | 11 refills | Status: AC

## 2022-10-20 NOTE — Assessment & Plan Note (Signed)
She is doing well on the Genvoya but has mutliple drug drug interactions, though she reports being off [place] now.  I discussed changing though to [facility] and will do that today.  Stop Genvoya.   Labs today and follow up in about 4 months.

## 2022-10-20 NOTE — Assessment & Plan Note (Signed)
Will check cmp, cbc 

## 2022-10-20 NOTE — Assessment & Plan Note (Signed)
Will screen 

## 2022-10-20 NOTE — Progress Notes (Signed)
   Subjective:    Patient ID: Chelsey Henson, female    DOB: 01-05-1971, 52 y.o.   MRN: [id number]  HPI Chelsey Henson is here for follow up of HIV She was last seen in March on [place] after a stay at behavioral [facility] ad since then has been on latuda, which interacts with the [facility] and she was advised to stop.  She however did not want to stop and has not followed up here until now.     Review of Systems  Constitutional:  Negative for fatigue.  Gastrointestinal:  Negative for diarrhea and nausea.  Skin:  Negative for rash.       Objective:   Physical Exam Eyes:     General: No scleral icterus. Pulmonary:     Effort: Pulmonary effort is normal.  Neurological:     Mental Status: She is alert.   SH: no tobacco        Assessment & Plan:

## 2022-10-22 LAB — CBC WITH DIFFERENTIAL/PLATELET
Absolute Monocytes: 781 {cells}/uL (ref 200–950)
Basophils Absolute: 92 {cells}/uL (ref 0–200)
Basophils Relative: 1.1 %
Eosinophils Absolute: 185 {cells}/uL (ref 15–500)
Eosinophils Relative: 2.2 %
HCT: 41.6 % (ref 35.0–45.0)
Hemoglobin: 14.2 g/dL (ref 11.7–15.5)
Lymphs Abs: 2428 {cells}/uL (ref 850–3900)
MCH: 30.3 pg (ref 27.0–33.0)
MCHC: 34.1 g/dL (ref 32.0–36.0)
MCV: 88.7 fL (ref 80.0–100.0)
MPV: 11.9 fL (ref 7.5–12.5)
Monocytes Relative: 9.3 %
Neutro Abs: 4914 {cells}/uL (ref 1500–7800)
Neutrophils Relative %: 58.5 %
Platelets: 250 10*3/uL (ref 140–400)
RBC: 4.69 10*6/uL (ref 3.80–5.10)
RDW: 13.1 % (ref 11.0–15.0)
Total Lymphocyte: 28.9 %
WBC: 8.4 10*3/uL (ref 3.8–10.8)

## 2022-10-22 LAB — COMPLETE METABOLIC PANEL WITH GFR
AG Ratio: 1.4 (calc) (ref 1.0–2.5)
ALT: 77 U/L — ABNORMAL HIGH (ref 6–29)
AST: 52 U/L — ABNORMAL HIGH (ref 10–35)
Albumin: 4.6 g/dL (ref 3.6–5.1)
Alkaline phosphatase (APISO): 99 U/L (ref 37–153)
BUN: 9 mg/dL (ref 7–25)
CO2: 25 mmol/L (ref 20–32)
Calcium: 9.7 mg/dL (ref 8.6–10.4)
Chloride: 103 mmol/L (ref 98–110)
Creat: 0.66 mg/dL (ref 0.50–1.03)
Globulin: 3.2 g/dL (ref 1.9–3.7)
Glucose, Bld: 62 mg/dL — ABNORMAL LOW (ref 65–99)
Potassium: 3.7 mmol/L (ref 3.5–5.3)
Sodium: 139 mmol/L (ref 135–146)
Total Bilirubin: 0.8 mg/dL (ref 0.2–1.2)
Total Protein: 7.8 g/dL (ref 6.1–8.1)
eGFR: 106 mL/min/{1.73_m2} (ref >=60)

## 2022-10-22 LAB — HIV-1 RNA QUANT-NO REFLEX-BLD
HIV 1 RNA Quant: NOT DETECTED {copies}/mL
HIV-1 RNA Quant, Log: NOT DETECTED {Log_copies}/mL

## 2022-10-22 LAB — T-HELPER CELL (CD4) - (RCID CLINIC ONLY)
CD4 % Helper T Cell: 49 % (ref 33–65)
CD4 T Cell Abs: 1124 /uL (ref 400–1790)

## 2022-10-22 LAB — URINE CYTOLOGY ANCILLARY ONLY
Chlamydia: NEGATIVE
Comment: NEGATIVE
Comment: NORMAL
Neisseria Gonorrhea: NEGATIVE

## 2022-10-22 LAB — RPR: RPR Ser Ql: NONREACTIVE

## 2022-10-28 ENCOUNTER — Ambulatory Visit
Admission: EM | Admit: 2022-10-28 | Discharge: 2022-10-28 | Disposition: A | Discharge status: Home / Self Care | Payer: Medicaid Other | Attending: Family Medicine | Admitting: Family Medicine

## 2022-10-28 DIAGNOSIS — H6992 Unspecified Eustachian tube disorder, left ear: Secondary | ICD-10-CM | POA: Diagnosis not present

## 2022-10-28 MED ORDER — PREDNISONE 50 MG PO TABS: ORAL_TABLET | ORAL | 0 refills | Status: AC

## 2022-10-28 MED ORDER — FLUTICASONE PROPIONATE 50 MCG/ACT NA SUSP: 1.0000 | Freq: Two times a day (BID) | NASAL | 2 refills | Status: AC

## 2022-10-28 NOTE — ED Provider Notes (Signed)
[facility]    CSN: [id number] Arrival date & time: 10/28/22  1114      History   Chief Complaint Chief Complaint  Patient presents with   Otalgia   Sore Throat    HPI Chelsey Henson is a 52 y.o. female.   Patient presenting today with 2-week history of sore throat, left ear pain and pressure radiating down the jaw.  Denies drainage from the ear, headache, fever, chills, cough, congestion.  So far not tried anything over-the-counter for symptoms.  No known sick contacts recently.    Past Medical History:  Diagnosis Date   Anemia    Anxiety    Asthma    COPD (chronic obstructive pulmonary disease) (HCC) 04/05/2016   Phreesia 05/28/2020   Depression    Depression    Phreesia 05/28/2020   Genital herpes    GERD (gastroesophageal reflux disease)    HIV (human immunodeficiency virus infection) (HCC) 11/24/2017   HIV infection (HCC) 2008   Hypertension    Routine screening for STI (sexually transmitted infection) 01/19/2022   Sleep apnea    Substance abuse (HCC)    cocaine- last use was Oct 2009   Vaginal Pap smear, abnormal     Patient Active Problem List   Diagnosis Date Noted   Medication monitoring encounter 10/20/2022   Episodic cannabis use 09/06/2022   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   Benzodiazepine misuse: Ran out of month supply after <2 weeks 09/06/2022   Long-term current use of benzodiazepine 08/24/2022   Routine screening for STI (sexually transmitted infection) 01/19/2022   Lipoma of buttock    Rash 07/23/2020   Post traumatic stress disorder (PTSD) 04/30/2019   Generalized anxiety disorder with panic attacks 04/30/2019   Encounter to establish care 10/27/2018   Transaminitis 05/24/2017   Gastroesophageal reflux disease without esophagitis 04/06/2017   Major depressive disorder, recurrent episode, severe, 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   Cocaine use disorder, moderate, in sustained remission (HCC) 09/08/2016    Obstructive sleep apnea (adult) (pediatric) 04/15/2016   Asthma 04/15/2016   HIV (human immunodeficiency virus infection) (HCC) 04/05/2016   Essential hypertension 04/05/2016   Psychophysiologic insomnia with sleep apnea not on CPAP 04/05/2016    Past Surgical History:  Procedure Laterality Date   ABDOMINAL HYSTERECTOMY     BENIGN MASS; total hysterectomy   CESAREAN SECTION     ESOPHAGOGASTRODUODENOSCOPY N/A 06/30/2017   Procedure: ESOPHAGOGASTRODUODENOSCOPY (EGD);  Surgeon: Malissa Hippo, MD;  [facility]: [facility];  Service: Endoscopy;  Laterality: N/A;  2:00   HERNIA REPAIR  2017   LIPOMA EXCISION Left 08/14/2021   Procedure: EXCISION LIPOMA, LEFT BUTTOCKS;  Surgeon: Franky Macho, MD;  Location: [facility];  Service: General;  Laterality: Left;    OB History     Gravida  6   Para  4   Term  4   Preterm      AB  2   Living  4      SAB  2   IAB      Ectopic      Multiple      Live Births               Home Medications    Prior to Admission medications   Medication Sig Start Date End Date Taking? Authorizing Provider  acyclovir (ZOVIRAX) 800 MG tablet TAKE (1) TABLET BY MOUTH ONCE DAILY. Patient taking differently: 800 mg daily. 07/18/20  Yes Heather Roberts, NP  albuterol (VENTOLIN HFA)  108 (90 Base) MCG/ACT inhaler Inhale 2 puffs into the lungs every 4 (four) hours as needed for wheezing or shortness of breath. 07/02/22  Yes Raspet, Erin K, PA-C  atorvastatin (LIPITOR) 40 MG tablet Take 40 mg by mouth every evening.   Yes [provider]  bictegravir-emtricitabine-tenofovir AF (BIKTARVY) 50-200-25 MG TABS tablet Take 1 tablet by mouth daily. 10/20/22  Yes Comer, Belia Heman, MD  escitalopram (LEXAPRO) 20 MG tablet TAKE (1) TABLET BY MOUTH ONCE DAILY. Patient taking differently: Take 20 mg by mouth daily. 06/12/20  Yes Heather Roberts, NP  estradiol (ESTRACE) 1 MG tablet Take 1 mg by mouth at bedtime.   Yes [provider]  fluticasone (FLONASE)  50 MCG/ACT nasal spray Place 1 spray into both nostrils 2 (two) times daily. 10/28/22  Yes Particia Nearing, PA-C  nitrofurantoin, macrocrystal-monohydrate, (MACROBID) 100 MG capsule Take 1 capsule (100 mg total) by mouth 2 (two) times daily. 10/06/22  Yes Leath-Warren, Sadie Haber, NP  omeprazole (PRILOSEC) 20 MG capsule Take 20 mg by mouth daily. 02/02/22  Yes [provider]  predniSONE (DELTASONE) 50 MG tablet Take 1 tab daily with breakfast for 3 days 10/28/22  Yes Particia Nearing, PA-C  ALPRAZolam Prudy Feeler) 0.5 MG tablet Take half a tablet in the morning, full tablet at lunch and bed time. 08/24/22   Elsie Lincoln, MD  lurasidone (LATUDA) 20 MG TABS tablet Take 1 tablet (20 mg total) by mouth at bedtime. 08/24/22 10/23/22  Elsie Lincoln, MD  nystatin cream (MYCOSTATIN) Apply 1 Application topically 2 (two) times daily as needed for dry skin (Apply to affected area).    [provider]  ondansetron (ZOFRAN-ODT) 8 MG disintegrating tablet Take 1 tablet (8 mg total) by mouth every 8 (eight) hours as needed for nausea. 09/28/22   Dione Booze, MD  potassium chloride SA (KLOR-CON M) 20 MEQ tablet Take 1 tablet (20 mEq total) by mouth 2 (two) times daily. 09/28/22   Dione Booze, MD  Tiotropium Bromide Monohydrate (SPIRIVA RESPIMAT) 2.5 MCG/ACT AERS Inhale 2 puffs into the lungs daily. 07/02/22   Raspet, Noberto Retort, PA-C  Vibegron (GEMTESA) 75 MG TABS Take 75 mg by mouth daily. 09/07/21   Stoneking, Danford Bad., MD  zolpidem (AMBIEN) 5 MG tablet Take 1 tablet (5 mg total) by mouth at bedtime as needed for sleep. 09/28/22   Dione Booze, MD  budesonide-formoterol [facility]) 160-4.5 MCG/ACT inhaler Inhale 2 puffs into the lungs 2 (two) times daily.  09/25/18  [provider]  montelukast (SINGULAIR) 10 MG tablet Take 1 tablet (10 mg total) by mouth at bedtime. 07/26/17 09/25/18  Comer, Belia Heman, MD    Family History Family History  Problem Relation Age of Onset   Depression  Mother    Hyperlipidemia Mother    Learning disabilities Mother    Alcohol abuse Mother 38   Alcohol abuse Father    Arthritis Father    Cancer Father    Heart disease Father    Hypertension Father    Learning disabilities Father    Alcohol abuse Sister        RECOVERED   Alcohol abuse Brother    Alcohol abuse Brother    Alcohol abuse Brother     Social History Social History   Tobacco Use   Smoking status: Never   Smokeless tobacco: Never  Vaping Use   Vaping status: Never Used  Substance Use Topics   Alcohol use: Not Currently    Alcohol/week:  1.0 standard drink of alcohol    Types: 1 Cans of beer per week    Comment: See psychiatry note from 08/23/2022   Drug use: Yes    Types: Cocaine, Marijuana    Comment: Unintentional overdose of 3 CBD Gummies on 09/04/2022 in the setting of running out of Xanax prescription from overuse     Allergies   Aripiprazole and Codeine   Review of Systems Review of Systems Per HPI  Physical Exam Triage Vital Signs ED Triage Vitals  Encounter Vitals Group     BP 10/28/22 1147 (!) 138/90     Systolic BP Percentile --      Diastolic BP Percentile --      Pulse Rate 10/28/22 1147 82     Resp 10/28/22 1147 16     Temp 10/28/22 1147 99.3 F (37.4 C)     Temp Source 10/28/22 1147 Oral     SpO2 10/28/22 1147 95 %     Weight --      Height --      Head Circumference --      Peak Flow --      Pain Score 10/28/22 1148 5     Pain Loc --      Pain Education --      Exclude from Growth Chart --    No data found.  Updated Vital Signs BP (!) 138/90 (BP Location: Right Arm)   Pulse 82   Temp 99.3 F (37.4 C) (Oral)   Resp 16   LMP 09/30/2014 (Approximate)   SpO2 95%   Visual Acuity Right Eye Distance:   Left Eye Distance:   Bilateral Distance:    Right Eye Near:   Left Eye Near:    Bilateral Near:     Physical Exam Vitals and nursing note reviewed.  Constitutional:      Appearance: Normal appearance. She is not  ill-appearing.  HENT:     Head: Atraumatic.     Ears:     Comments: Left middle ear effusion present    Nose: Nose normal.     Mouth/Throat:     Mouth: Mucous membranes are moist.     Pharynx: Oropharynx is clear. No oropharyngeal exudate or posterior oropharyngeal erythema.  Eyes:     Extraocular Movements: Extraocular movements intact.     Conjunctiva/sclera: Conjunctivae normal.  Cardiovascular:     Rate and Rhythm: Normal rate and regular rhythm.     Heart sounds: Normal heart sounds.  Pulmonary:     Effort: Pulmonary effort is normal.     Breath sounds: Normal breath sounds.  Musculoskeletal:        General: Normal range of motion.     Cervical back: Normal range of motion and neck supple.  Lymphadenopathy:     Cervical: No cervical adenopathy.  Skin:    General: Skin is warm and dry.  Neurological:     Mental Status: She is alert and oriented to person, place, and time.  Psychiatric:        Mood and Affect: Mood normal.        Thought Content: Thought content normal.        Judgment: Judgment normal.      UC Treatments / Results  Labs (all labs ordered are listed, but only abnormal results are displayed) Labs Reviewed - No data to display  EKG   Radiology No results found.  Procedures Procedures (including critical care time)  Medications Ordered in UC Medications -  No data to display  Initial Impression / Assessment and Plan / UC Course  I have reviewed the triage vital signs and the nursing notes.  Pertinent labs & imaging results that were available during my care of the patient were reviewed by me and considered in my medical decision making (see chart for details).     Treat with short burst of prednisone, Flonase, Coricidin HBP, over-the-counter pain relievers as needed.  Return for worsening symptoms.  No evidence of bacterial infection today.  Final Clinical Impressions(s) / UC Diagnoses   Final diagnoses:  Acute dysfunction of left  eustachian tube     Discharge Instructions      Take the 2 medications as prescribed as well as Coricidin HBP as needed    ED Prescriptions     Medication Sig Dispense Auth. Provider   predniSONE (DELTASONE) 50 MG tablet Take 1 tab daily with breakfast for 3 days 3 tablet Particia Nearing, PA-C   fluticasone [facility]) 50 MCG/ACT nasal spray Place 1 spray into both nostrils 2 (two) times daily. 16 g Particia Nearing, [place]      PDMP not reviewed this encounter.   Particia Nearing, [place] 10/28/22 1214

## 2022-10-28 NOTE — Discharge Instructions (Signed)
Take the 2 medications as prescribed as well as Coricidin HBP as needed

## 2022-10-28 NOTE — ED Triage Notes (Signed)
Pt presents with sore throat, ear and jaw pain on left side that started 2 weeks ago.

## 2022-11-25 ENCOUNTER — Other Ambulatory Visit: Payer: Self-pay

## 2022-11-25 ENCOUNTER — Ambulatory Visit
Admission: RE | Admit: 2022-11-25 | Discharge: 2022-11-25 | Disposition: A | Discharge status: Home / Self Care | Payer: MEDICAID | Source: Ambulatory Visit | Attending: Family Medicine | Admitting: Family Medicine

## 2022-11-25 VITALS — BP 140/94 | HR 90 | Temp 98.2°F | Resp 20

## 2022-11-25 DIAGNOSIS — H66002 Acute suppurative otitis media without spontaneous rupture of ear drum, left ear: Secondary | ICD-10-CM | POA: Diagnosis not present

## 2022-11-25 DIAGNOSIS — T148XXA Other injury of unspecified body region, initial encounter: Secondary | ICD-10-CM

## 2022-11-25 MED ORDER — AMOXICILLIN 875 MG PO TABS: 875.0000 mg | ORAL_TABLET | Freq: Two times a day (BID) | ORAL | 0 refills | Status: AC

## 2022-11-25 NOTE — ED Provider Notes (Signed)
[facility]    CSN: [id number] Arrival date & time: 11/25/22  1741      History   Chief Complaint Chief Complaint  Patient presents with   Ear Drainage    Entered by patient    HPI Chelsey Henson is a 52 y.o. female.   Patient presenting today with 3-week history of progressively worsening left ear pain now radiating down face.  Denies drainage, loss of hearing, headache, fever, chills.  Was placed on steroids for a middle ear effusion several weeks ago, states this never resolved the issue and it has progressively gotten worse.  Taking over-the-counter pain relievers with minimal relief.  She also has 2 small splinters in her right hand that she is requesting to see if these can be removed.    Past Medical History:  Diagnosis Date   Anemia    Anxiety    Asthma    COPD (chronic obstructive pulmonary disease) (HCC) 04/05/2016   Phreesia 05/28/2020   Depression    Depression    Phreesia 05/28/2020   Genital herpes    GERD (gastroesophageal reflux disease)    HIV (human immunodeficiency virus infection) (HCC) 11/24/2017   HIV infection (HCC) 2008   Hypertension    Routine screening for STI (sexually transmitted infection) 01/19/2022   Sleep apnea    Substance abuse (HCC)    cocaine- last use was Oct 2009   Vaginal Pap smear, abnormal     Patient Active Problem List   Diagnosis Date Noted   Medication monitoring encounter 10/20/2022   Episodic cannabis use 09/06/2022   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   Benzodiazepine misuse: Ran out of month supply after <2 weeks 09/06/2022   Long-term current use of benzodiazepine 08/24/2022   Routine screening for STI (sexually transmitted infection) 01/19/2022   Lipoma of buttock    Rash 07/23/2020   Post traumatic stress disorder (PTSD) 04/30/2019   Generalized anxiety disorder with panic attacks 04/30/2019   Encounter to establish care 10/27/2018   Transaminitis 05/24/2017   Gastroesophageal reflux  disease without esophagitis 04/06/2017   Major depressive disorder, recurrent episode, severe, 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   Cocaine use disorder, moderate, in sustained remission (HCC) 09/08/2016   Obstructive sleep apnea (adult) (pediatric) 04/15/2016   Asthma 04/15/2016   HIV (human immunodeficiency virus infection) (HCC) 04/05/2016   Essential hypertension 04/05/2016   Psychophysiologic insomnia with sleep apnea not on CPAP 04/05/2016    Past Surgical History:  Procedure Laterality Date   ABDOMINAL HYSTERECTOMY     BENIGN MASS; total hysterectomy   CESAREAN SECTION     ESOPHAGOGASTRODUODENOSCOPY N/A 06/30/2017   Procedure: ESOPHAGOGASTRODUODENOSCOPY (EGD);  Surgeon: Malissa Hippo, MD;  Location: [facility];  Service: Endoscopy;  Laterality: N/A;  2:00   HERNIA REPAIR  2017   LIPOMA EXCISION Left 08/14/2021   Procedure: EXCISION LIPOMA, LEFT BUTTOCKS;  Surgeon: Franky Macho, MD;  Location: [facility];  Service: General;  Laterality: Left;    OB History     Gravida  6   Para  4   Term  4   Preterm      AB  2   Living  4      SAB  2   IAB      Ectopic      Multiple      Live Births               Home Medications    Prior to Admission medications  Medication Sig Start Date End Date Taking? Authorizing Provider  amoxicillin (AMOXIL) 875 MG tablet Take 1 tablet (875 mg total) by mouth 2 (two) times daily. 11/25/22  Yes Particia Nearing, PA-C  acyclovir (ZOVIRAX) 800 MG tablet TAKE (1) TABLET BY MOUTH ONCE DAILY. Patient taking differently: 800 mg daily. 07/18/20   Heather Roberts, NP  albuterol (VENTOLIN HFA) 108 (90 Base) MCG/ACT inhaler Inhale 2 puffs into the lungs every 4 (four) hours as needed for wheezing or shortness of breath. 07/02/22   Raspet, Noberto Retort, PA-C  ALPRAZolam Prudy Feeler) 0.5 MG tablet Take half a tablet in the morning, full tablet at lunch and bed time. 08/24/22   Elsie Lincoln, MD  atorvastatin (LIPITOR) 40 MG tablet Take  40 mg by mouth every evening.    [provider]  bictegravir-emtricitabine-tenofovir AF (BIKTARVY) 50-200-25 MG TABS tablet Take 1 tablet by mouth daily. 10/20/22   Gardiner Barefoot, MD  escitalopram (LEXAPRO) 20 MG tablet TAKE (1) TABLET BY MOUTH ONCE DAILY. Patient taking differently: Take 20 mg by mouth daily. 06/12/20   Heather Roberts, NP  estradiol (ESTRACE) 1 MG tablet Take 1 mg by mouth at bedtime.    [provider]  fluticasone (FLONASE) 50 MCG/ACT nasal spray Place 1 spray into both nostrils 2 (two) times daily. 10/28/22   Particia Nearing, PA-C  lurasidone (LATUDA) 20 MG TABS tablet Take 1 tablet (20 mg total) by mouth at bedtime. 08/24/22 10/23/22  Elsie Lincoln, MD  nitrofurantoin, macrocrystal-monohydrate, (MACROBID) 100 MG capsule Take 1 capsule (100 mg total) by mouth 2 (two) times daily. 10/06/22   Leath-Warren, Sadie Haber, NP  nystatin cream (MYCOSTATIN) Apply 1 Application topically 2 (two) times daily as needed for dry skin (Apply to affected area).    [provider]  omeprazole (PRILOSEC) 20 MG capsule Take 20 mg by mouth daily. 02/02/22   [provider]  ondansetron (ZOFRAN-ODT) 8 MG disintegrating tablet Take 1 tablet (8 mg total) by mouth every 8 (eight) hours as needed for nausea. 09/28/22   Dione Booze, MD  potassium chloride SA (KLOR-CON M) 20 MEQ tablet Take 1 tablet (20 mEq total) by mouth 2 (two) times daily. 09/28/22   Dione Booze, MD  predniSONE (DELTASONE) 50 MG tablet Take 1 tab daily with breakfast for 3 days 10/28/22   Particia Nearing, PA-C  Tiotropium Bromide Monohydrate (SPIRIVA RESPIMAT) 2.5 MCG/ACT AERS Inhale 2 puffs into the lungs daily. 07/02/22   Raspet, Noberto Retort, PA-C  Vibegron (GEMTESA) 75 MG TABS Take 75 mg by mouth daily. 09/07/21   Stoneking, Danford Bad., MD  zolpidem (AMBIEN) 5 MG tablet Take 1 tablet (5 mg total) by mouth at bedtime as needed for sleep. 09/28/22   Dione Booze, MD  budesonide-formoterol  [facility]) 160-4.5 MCG/ACT inhaler Inhale 2 puffs into the lungs 2 (two) times daily.  09/25/18  [provider]  montelukast (SINGULAIR) 10 MG tablet Take 1 tablet (10 mg total) by mouth at bedtime. 07/26/17 09/25/18  Comer, Belia Heman, MD    Family History Family History  Problem Relation Age of Onset   Depression Mother    Hyperlipidemia Mother    Learning disabilities Mother    Alcohol abuse Mother 72   Alcohol abuse Father    Arthritis Father    Cancer Father    Heart disease Father    Hypertension Father    Learning disabilities Father    Alcohol abuse Sister  RECOVERED   Alcohol abuse Brother    Alcohol abuse Brother    Alcohol abuse Brother     Social History Social History   Tobacco Use   Smoking status: Never   Smokeless tobacco: Never  Vaping Use   Vaping status: Never Used  Substance Use Topics   Alcohol use: Not Currently    Alcohol/week: 1.0 standard drink of alcohol    Types: 1 Cans of beer per week    Comment: See psychiatry note from 08/23/2022   Drug use: Yes    Types: Cocaine, Marijuana    Comment: Unintentional overdose of 3 CBD Gummies on 09/04/2022 in the setting of running out of Xanax prescription from overuse     Allergies   Aripiprazole and Codeine   Review of Systems Review of Systems Per HPI  Physical Exam Triage Vital Signs ED Triage Vitals  Encounter Vitals Group     BP 11/25/22 1806 (!) 140/94     Systolic BP Percentile --      Diastolic BP Percentile --      Pulse Rate 11/25/22 1806 90     Resp 11/25/22 1806 20     Temp 11/25/22 1806 98.2 F (36.8 C)     Temp Source 11/25/22 1806 Oral     SpO2 11/25/22 1806 97 %     Weight --      Height --      Head Circumference --      Peak Flow --      Pain Score 11/25/22 1805 5     Pain Loc --      Pain Education --      Exclude from Growth Chart --    No data found.  Updated Vital Signs BP (!) 140/94 (BP Location: Right Arm)   Pulse 90   Temp 98.2 F (36.8 C)  (Oral)   Resp 20   LMP 09/30/2014 (Approximate)   SpO2 97%   Visual Acuity Right Eye Distance:   Left Eye Distance:   Bilateral Distance:    Right Eye Near:   Left Eye Near:    Bilateral Near:     Physical Exam Vitals and nursing note reviewed.  Constitutional:      Appearance: Normal appearance. She is not ill-appearing.  HENT:     Head: Atraumatic.     Right Ear: Tympanic membrane and ear canal normal.     Ears:     Comments: Left TM erythematous, edematous    Nose: Nose normal.     Mouth/Throat:     Mouth: Mucous membranes are moist.     Pharynx: Oropharynx is clear.  Eyes:     Extraocular Movements: Extraocular movements intact.     Conjunctiva/sclera: Conjunctivae normal.  Cardiovascular:     Rate and Rhythm: Normal rate and regular rhythm.     Heart sounds: Normal heart sounds.  Pulmonary:     Effort: Pulmonary effort is normal.     Breath sounds: Normal breath sounds.  Musculoskeletal:        General: Normal range of motion.     Cervical back: Normal range of motion and neck supple.  Skin:    General: Skin is warm and dry.     Comments: 2 tiny splinters to right index finger on palmar aspect  Neurological:     Mental Status: She is alert and oriented to person, place, and time.     Motor: No weakness.     Gait: Gait normal.  Psychiatric:        Mood and Affect: Mood normal.        Thought Content: Thought content normal.        Judgment: Judgment normal.      UC Treatments / Results  Labs (all labs ordered are listed, but only abnormal results are displayed) Labs Reviewed - No data to display  EKG   Radiology No results found.  Procedures Procedures (including critical care time)  Medications Ordered in UC Medications - No data to display  Initial Impression / Assessment and Plan / UC Course  I have reviewed the triage vital signs and the nursing notes.  Pertinent labs & imaging results that were available during my care of the patient  were reviewed by me and considered in my medical decision making (see chart for details).     Treat with Amoxil, Flonase, over-the-counter pain relievers as needed.  Attempted removal of splinters but was unsuccessful given the tiny size and depth of them.  Epsom salt soaks, discussed with patient that her skin would eventually expel them naturally.  Return for worsening symptoms.  Final Clinical Impressions(s) / UC Diagnoses   Final diagnoses:  Acute suppurative otitis media of left ear without spontaneous rupture of tympanic membrane, recurrence not specified  Splinter     Discharge Instructions      May do warm Epsom salt soaks once to twice daily to see if this will coax the splinters out.  Keep the hand clean, follow-up for worsening symptoms.    ED Prescriptions     Medication Sig Dispense Auth. Provider   amoxicillin (AMOXIL) 875 MG tablet Take 1 tablet (875 mg total) by mouth 2 (two) times daily. 20 tablet Particia Nearing, [place]      PDMP not reviewed this encounter.   Particia Nearing, [place] 11/25/22 [phone]

## 2022-11-25 NOTE — Discharge Instructions (Signed)
May do warm Epsom salt soaks once to twice daily to see if this will coax the splinters out.  Keep the hand clean, follow-up for worsening symptoms.

## 2022-11-25 NOTE — ED Triage Notes (Signed)
Pt reports left ear pain, sore throat, left jaw pain for last several weeks. Pt denies any known fevers.

## 2022-12-02 ENCOUNTER — Encounter (INDEPENDENT_AMBULATORY_CARE_PROVIDER_SITE_OTHER): Payer: Self-pay | Admitting: *Deleted

## 2022-12-23 ENCOUNTER — Ambulatory Visit: Payer: MEDICAID | Admitting: Internal Medicine

## 2022-12-27 DIAGNOSIS — M2669 Other specified disorders of temporomandibular joint: Secondary | ICD-10-CM | POA: Diagnosis not present

## 2022-12-27 DIAGNOSIS — D1030 Benign neoplasm of unspecified part of mouth: Secondary | ICD-10-CM | POA: Diagnosis not present

## 2022-12-27 DIAGNOSIS — H9202 Otalgia, left ear: Secondary | ICD-10-CM | POA: Diagnosis not present

## 2023-01-07 DIAGNOSIS — D1030 Benign neoplasm of unspecified part of mouth: Secondary | ICD-10-CM | POA: Diagnosis not present

## 2023-01-07 DIAGNOSIS — M2669 Other specified disorders of temporomandibular joint: Secondary | ICD-10-CM | POA: Diagnosis not present

## 2023-01-13 DIAGNOSIS — R7303 Prediabetes: Secondary | ICD-10-CM | POA: Diagnosis not present

## 2023-01-19 DIAGNOSIS — K5900 Constipation, unspecified: Secondary | ICD-10-CM | POA: Diagnosis not present

## 2023-01-19 DIAGNOSIS — F329 Major depressive disorder, single episode, unspecified: Secondary | ICD-10-CM | POA: Diagnosis not present

## 2023-01-19 DIAGNOSIS — M25561 Pain in right knee: Secondary | ICD-10-CM | POA: Diagnosis not present

## 2023-01-19 DIAGNOSIS — N951 Menopausal and female climacteric states: Secondary | ICD-10-CM | POA: Diagnosis not present

## 2023-01-19 DIAGNOSIS — K219 Gastro-esophageal reflux disease without esophagitis: Secondary | ICD-10-CM | POA: Diagnosis not present

## 2023-01-19 DIAGNOSIS — R7401 Elevation of levels of liver transaminase levels: Secondary | ICD-10-CM | POA: Diagnosis not present

## 2023-01-19 DIAGNOSIS — F411 Generalized anxiety disorder: Secondary | ICD-10-CM | POA: Diagnosis not present

## 2023-01-19 DIAGNOSIS — Z0120 Encounter for dental examination and cleaning without abnormal findings: Secondary | ICD-10-CM | POA: Diagnosis not present

## 2023-01-19 DIAGNOSIS — M5441 Lumbago with sciatica, right side: Secondary | ICD-10-CM | POA: Diagnosis not present

## 2023-01-19 DIAGNOSIS — F5105 Insomnia due to other mental disorder: Secondary | ICD-10-CM | POA: Diagnosis not present

## 2023-01-19 DIAGNOSIS — Z0001 Encounter for general adult medical examination with abnormal findings: Secondary | ICD-10-CM | POA: Diagnosis not present

## 2023-02-15 ENCOUNTER — Ambulatory Visit: Payer: Medicaid Other | Admitting: Internal Medicine

## 2023-03-28 DIAGNOSIS — M25551 Pain in right hip: Secondary | ICD-10-CM | POA: Insufficient documentation

## 2023-04-18 ENCOUNTER — Telehealth: Payer: Self-pay

## 2023-04-18 DIAGNOSIS — I10 Essential (primary) hypertension: Secondary | ICD-10-CM

## 2023-04-19 ENCOUNTER — Other Ambulatory Visit: Payer: Self-pay | Admitting: Obstetrics and Gynecology

## 2023-04-19 NOTE — Patient Outreach (Signed)
Care Coordination  04/19/2023  Chelsey Henson 1970-06-15 [id number]  RNCM called patient at scheduled time.  Patient answered phone and stated she no longer lives in the area-she lives near [place], [place].  Patient will contact [facility] for providers in her area.  All questions  answered at this time.  [name] RN, BSN, [facility] RN Care Manager Direct Dial [phone]/RKY [phone] Website: [email or web address]

## 2023-05-09 DIAGNOSIS — Z6836 Body mass index (BMI) 36.0-36.9, adult: Secondary | ICD-10-CM | POA: Diagnosis not present

## 2023-05-09 DIAGNOSIS — G4733 Obstructive sleep apnea (adult) (pediatric): Secondary | ICD-10-CM | POA: Diagnosis not present

## 2023-05-09 DIAGNOSIS — J4540 Moderate persistent asthma, uncomplicated: Secondary | ICD-10-CM | POA: Diagnosis not present

## 2023-05-09 DIAGNOSIS — R0602 Shortness of breath: Secondary | ICD-10-CM | POA: Diagnosis not present

## 2023-05-09 DIAGNOSIS — E66812 Obesity, class 2: Secondary | ICD-10-CM | POA: Diagnosis not present

## 2023-05-09 DIAGNOSIS — J449 Chronic obstructive pulmonary disease, unspecified: Secondary | ICD-10-CM | POA: Insufficient documentation

## 2023-05-11 DIAGNOSIS — F4310 Post-traumatic stress disorder, unspecified: Secondary | ICD-10-CM | POA: Diagnosis not present

## 2023-05-18 NOTE — Progress Notes (Signed)
 Notified by referral coordinator that patient will be transferring care to [facility]. [facility] notified.   Sandie Ano, RN

## 2023-05-30 DIAGNOSIS — F4310 Post-traumatic stress disorder, unspecified: Secondary | ICD-10-CM | POA: Diagnosis not present

## 2023-06-02 DIAGNOSIS — Z0001 Encounter for general adult medical examination with abnormal findings: Secondary | ICD-10-CM | POA: Diagnosis not present

## 2023-06-02 DIAGNOSIS — F603 Borderline personality disorder: Secondary | ICD-10-CM | POA: Diagnosis not present

## 2023-06-02 DIAGNOSIS — E782 Mixed hyperlipidemia: Secondary | ICD-10-CM | POA: Diagnosis not present

## 2023-06-02 DIAGNOSIS — J449 Chronic obstructive pulmonary disease, unspecified: Secondary | ICD-10-CM | POA: Diagnosis not present

## 2023-06-02 DIAGNOSIS — Z23 Encounter for immunization: Secondary | ICD-10-CM | POA: Diagnosis not present

## 2023-06-02 DIAGNOSIS — F419 Anxiety disorder, unspecified: Secondary | ICD-10-CM | POA: Diagnosis not present

## 2023-06-02 DIAGNOSIS — R0609 Other forms of dyspnea: Secondary | ICD-10-CM | POA: Diagnosis not present

## 2023-06-02 DIAGNOSIS — F902 Attention-deficit hyperactivity disorder, combined type: Secondary | ICD-10-CM | POA: Diagnosis not present

## 2023-06-02 DIAGNOSIS — Z21 Asymptomatic human immunodeficiency virus [HIV] infection status: Secondary | ICD-10-CM | POA: Diagnosis not present

## 2023-06-02 DIAGNOSIS — J4540 Moderate persistent asthma, uncomplicated: Secondary | ICD-10-CM | POA: Diagnosis not present

## 2023-06-02 DIAGNOSIS — N951 Menopausal and female climacteric states: Secondary | ICD-10-CM | POA: Diagnosis not present

## 2023-06-07 DIAGNOSIS — R300 Dysuria: Secondary | ICD-10-CM | POA: Diagnosis not present

## 2023-06-09 DIAGNOSIS — G8929 Other chronic pain: Secondary | ICD-10-CM | POA: Diagnosis not present

## 2023-06-09 DIAGNOSIS — M5441 Lumbago with sciatica, right side: Secondary | ICD-10-CM | POA: Diagnosis not present

## 2023-06-16 DIAGNOSIS — S61210A Laceration without foreign body of right index finger without damage to nail, initial encounter: Secondary | ICD-10-CM | POA: Diagnosis not present

## 2023-06-16 DIAGNOSIS — S61411A Laceration without foreign body of right hand, initial encounter: Secondary | ICD-10-CM | POA: Diagnosis not present

## 2023-06-16 DIAGNOSIS — S61011A Laceration without foreign body of right thumb without damage to nail, initial encounter: Secondary | ICD-10-CM | POA: Diagnosis not present

## 2023-06-16 DIAGNOSIS — F419 Anxiety disorder, unspecified: Secondary | ICD-10-CM | POA: Diagnosis not present

## 2023-06-16 DIAGNOSIS — S61212A Laceration without foreign body of right middle finger without damage to nail, initial encounter: Secondary | ICD-10-CM | POA: Diagnosis not present

## 2023-06-16 DIAGNOSIS — S61211A Laceration without foreign body of left index finger without damage to nail, initial encounter: Secondary | ICD-10-CM | POA: Diagnosis not present

## 2023-06-20 DIAGNOSIS — R0609 Other forms of dyspnea: Secondary | ICD-10-CM | POA: Diagnosis not present

## 2023-06-20 DIAGNOSIS — I952 Hypotension due to drugs: Secondary | ICD-10-CM | POA: Diagnosis not present

## 2023-06-21 DIAGNOSIS — M5136 Other intervertebral disc degeneration, lumbar region with discogenic back pain only: Secondary | ICD-10-CM | POA: Diagnosis not present

## 2023-06-21 DIAGNOSIS — M51379 Other intervertebral disc degeneration, lumbosacral region without mention of lumbar back pain or lower extremity pain: Secondary | ICD-10-CM | POA: Diagnosis not present

## 2023-07-07 DIAGNOSIS — Z113 Encounter for screening for infections with a predominantly sexual mode of transmission: Secondary | ICD-10-CM | POA: Diagnosis not present

## 2023-07-07 DIAGNOSIS — G629 Polyneuropathy, unspecified: Secondary | ICD-10-CM | POA: Diagnosis not present

## 2023-07-07 DIAGNOSIS — Z111 Encounter for screening for respiratory tuberculosis: Secondary | ICD-10-CM | POA: Diagnosis not present

## 2023-07-07 DIAGNOSIS — Z21 Asymptomatic human immunodeficiency virus [HIV] infection status: Secondary | ICD-10-CM | POA: Diagnosis not present

## 2023-07-08 DIAGNOSIS — Z1231 Encounter for screening mammogram for malignant neoplasm of breast: Secondary | ICD-10-CM | POA: Diagnosis not present

## 2023-07-13 DIAGNOSIS — I952 Hypotension due to drugs: Secondary | ICD-10-CM | POA: Diagnosis not present

## 2023-07-13 DIAGNOSIS — R0609 Other forms of dyspnea: Secondary | ICD-10-CM | POA: Diagnosis not present

## 2023-07-14 DIAGNOSIS — M25651 Stiffness of right hip, not elsewhere classified: Secondary | ICD-10-CM | POA: Diagnosis not present

## 2023-07-14 DIAGNOSIS — M5416 Radiculopathy, lumbar region: Secondary | ICD-10-CM | POA: Diagnosis not present

## 2023-07-14 DIAGNOSIS — M25652 Stiffness of left hip, not elsewhere classified: Secondary | ICD-10-CM | POA: Diagnosis not present

## 2023-07-26 DIAGNOSIS — E782 Mixed hyperlipidemia: Secondary | ICD-10-CM | POA: Diagnosis not present

## 2023-07-26 DIAGNOSIS — R0609 Other forms of dyspnea: Secondary | ICD-10-CM | POA: Diagnosis not present

## 2023-07-26 DIAGNOSIS — M79604 Pain in right leg: Secondary | ICD-10-CM | POA: Diagnosis not present

## 2023-07-26 DIAGNOSIS — M79605 Pain in left leg: Secondary | ICD-10-CM | POA: Diagnosis not present

## 2023-07-26 DIAGNOSIS — Z0001 Encounter for general adult medical examination with abnormal findings: Secondary | ICD-10-CM | POA: Diagnosis not present

## 2023-07-26 DIAGNOSIS — M79671 Pain in right foot: Secondary | ICD-10-CM | POA: Diagnosis not present

## 2023-07-26 DIAGNOSIS — M5441 Lumbago with sciatica, right side: Secondary | ICD-10-CM | POA: Diagnosis not present

## 2023-07-26 DIAGNOSIS — G8929 Other chronic pain: Secondary | ICD-10-CM | POA: Diagnosis not present

## 2023-07-26 DIAGNOSIS — Z21 Asymptomatic human immunodeficiency virus [HIV] infection status: Secondary | ICD-10-CM | POA: Diagnosis not present

## 2023-07-26 DIAGNOSIS — M79672 Pain in left foot: Secondary | ICD-10-CM | POA: Diagnosis not present

## 2023-08-01 DIAGNOSIS — M25652 Stiffness of left hip, not elsewhere classified: Secondary | ICD-10-CM | POA: Diagnosis not present

## 2023-08-01 DIAGNOSIS — M5416 Radiculopathy, lumbar region: Secondary | ICD-10-CM | POA: Diagnosis not present

## 2023-08-01 DIAGNOSIS — M25651 Stiffness of right hip, not elsewhere classified: Secondary | ICD-10-CM | POA: Diagnosis not present

## 2023-08-02 DIAGNOSIS — M51379 Other intervertebral disc degeneration, lumbosacral region without mention of lumbar back pain or lower extremity pain: Secondary | ICD-10-CM | POA: Diagnosis not present

## 2023-08-20 DIAGNOSIS — B372 Candidiasis of skin and nail: Secondary | ICD-10-CM | POA: Diagnosis not present

## 2023-08-20 DIAGNOSIS — L309 Dermatitis, unspecified: Secondary | ICD-10-CM | POA: Diagnosis not present

## 2023-08-20 DIAGNOSIS — R112 Nausea with vomiting, unspecified: Secondary | ICD-10-CM | POA: Diagnosis not present

## 2023-08-20 DIAGNOSIS — R197 Diarrhea, unspecified: Secondary | ICD-10-CM | POA: Diagnosis not present

## 2023-08-20 DIAGNOSIS — B379 Candidiasis, unspecified: Secondary | ICD-10-CM | POA: Diagnosis not present

## 2023-08-22 DIAGNOSIS — M545 Low back pain, unspecified: Secondary | ICD-10-CM | POA: Diagnosis not present

## 2023-08-22 DIAGNOSIS — M47816 Spondylosis without myelopathy or radiculopathy, lumbar region: Secondary | ICD-10-CM | POA: Diagnosis not present

## 2023-08-22 DIAGNOSIS — M25551 Pain in right hip: Secondary | ICD-10-CM | POA: Diagnosis not present

## 2023-09-01 DIAGNOSIS — M79605 Pain in left leg: Secondary | ICD-10-CM | POA: Diagnosis not present

## 2023-09-01 DIAGNOSIS — R202 Paresthesia of skin: Secondary | ICD-10-CM | POA: Diagnosis not present

## 2023-09-01 DIAGNOSIS — G629 Polyneuropathy, unspecified: Secondary | ICD-10-CM | POA: Diagnosis not present

## 2023-09-01 DIAGNOSIS — M79604 Pain in right leg: Secondary | ICD-10-CM | POA: Diagnosis not present

## 2023-09-01 DIAGNOSIS — R0609 Other forms of dyspnea: Secondary | ICD-10-CM | POA: Diagnosis not present

## 2023-09-01 DIAGNOSIS — R0781 Pleurodynia: Secondary | ICD-10-CM | POA: Diagnosis not present

## 2023-09-01 DIAGNOSIS — M79671 Pain in right foot: Secondary | ICD-10-CM | POA: Diagnosis not present

## 2023-09-01 DIAGNOSIS — R0782 Intercostal pain: Secondary | ICD-10-CM | POA: Diagnosis not present

## 2023-09-01 DIAGNOSIS — Z21 Asymptomatic human immunodeficiency virus [HIV] infection status: Secondary | ICD-10-CM | POA: Diagnosis not present

## 2023-09-01 DIAGNOSIS — I700 Atherosclerosis of aorta: Secondary | ICD-10-CM | POA: Diagnosis not present

## 2023-09-15 ENCOUNTER — Ambulatory Visit: Admitting: Orthopedic Surgery

## 2023-10-13 DIAGNOSIS — R0609 Other forms of dyspnea: Secondary | ICD-10-CM | POA: Diagnosis not present

## 2023-10-14 DIAGNOSIS — M5416 Radiculopathy, lumbar region: Secondary | ICD-10-CM | POA: Diagnosis not present

## 2023-10-14 DIAGNOSIS — R202 Paresthesia of skin: Secondary | ICD-10-CM | POA: Diagnosis not present

## 2023-10-21 ENCOUNTER — Encounter: Payer: Self-pay | Admitting: Radiology

## 2023-10-21 DIAGNOSIS — R102 Pelvic and perineal pain: Secondary | ICD-10-CM | POA: Diagnosis not present

## 2023-10-21 DIAGNOSIS — N736 Female pelvic peritoneal adhesions (postinfective): Secondary | ICD-10-CM | POA: Diagnosis not present

## 2023-11-01 DIAGNOSIS — R202 Paresthesia of skin: Secondary | ICD-10-CM | POA: Diagnosis not present

## 2023-11-02 DIAGNOSIS — M79672 Pain in left foot: Secondary | ICD-10-CM | POA: Diagnosis not present

## 2023-11-04 DIAGNOSIS — M79672 Pain in left foot: Secondary | ICD-10-CM | POA: Diagnosis not present

## 2023-11-15 DIAGNOSIS — M5416 Radiculopathy, lumbar region: Secondary | ICD-10-CM | POA: Diagnosis not present

## 2023-11-15 DIAGNOSIS — R202 Paresthesia of skin: Secondary | ICD-10-CM | POA: Diagnosis not present

## 2023-11-18 DIAGNOSIS — Z713 Dietary counseling and surveillance: Secondary | ICD-10-CM | POA: Diagnosis not present

## 2023-11-18 DIAGNOSIS — R350 Frequency of micturition: Secondary | ICD-10-CM | POA: Diagnosis not present

## 2023-11-18 DIAGNOSIS — Z6837 Body mass index (BMI) 37.0-37.9, adult: Secondary | ICD-10-CM | POA: Diagnosis not present

## 2023-11-18 DIAGNOSIS — K432 Incisional hernia without obstruction or gangrene: Secondary | ICD-10-CM | POA: Diagnosis not present

## 2023-11-18 DIAGNOSIS — R1030 Lower abdominal pain, unspecified: Secondary | ICD-10-CM | POA: Diagnosis not present

## 2023-11-18 DIAGNOSIS — E66812 Obesity, class 2: Secondary | ICD-10-CM | POA: Diagnosis not present

## 2023-11-28 DIAGNOSIS — G603 Idiopathic progressive neuropathy: Secondary | ICD-10-CM | POA: Diagnosis not present

## 2023-11-28 DIAGNOSIS — R202 Paresthesia of skin: Secondary | ICD-10-CM | POA: Diagnosis not present

## 2023-11-28 DIAGNOSIS — M5416 Radiculopathy, lumbar region: Secondary | ICD-10-CM | POA: Diagnosis not present

## 2023-12-06 DIAGNOSIS — R1030 Lower abdominal pain, unspecified: Secondary | ICD-10-CM | POA: Diagnosis not present

## 2023-12-06 DIAGNOSIS — K432 Incisional hernia without obstruction or gangrene: Secondary | ICD-10-CM | POA: Diagnosis not present

## 2023-12-06 DIAGNOSIS — R350 Frequency of micturition: Secondary | ICD-10-CM | POA: Diagnosis not present

## 2024-01-02 ENCOUNTER — Encounter: Payer: Self-pay | Admitting: Radiology

## 2024-01-03 DIAGNOSIS — I10 Essential (primary) hypertension: Secondary | ICD-10-CM | POA: Diagnosis not present

## 2024-01-03 DIAGNOSIS — N3944 Nocturnal enuresis: Secondary | ICD-10-CM | POA: Diagnosis not present

## 2024-01-03 DIAGNOSIS — I700 Atherosclerosis of aorta: Secondary | ICD-10-CM | POA: Diagnosis not present

## 2024-01-03 DIAGNOSIS — N3281 Overactive bladder: Secondary | ICD-10-CM | POA: Diagnosis not present

## 2024-01-05 DIAGNOSIS — Z713 Dietary counseling and surveillance: Secondary | ICD-10-CM | POA: Diagnosis not present

## 2024-01-05 DIAGNOSIS — E66812 Obesity, class 2: Secondary | ICD-10-CM | POA: Diagnosis not present

## 2024-01-05 DIAGNOSIS — Z5320 Procedure and treatment not carried out because of patient's decision for unspecified reasons: Secondary | ICD-10-CM | POA: Diagnosis not present

## 2024-01-05 DIAGNOSIS — K7469 Other cirrhosis of liver: Secondary | ICD-10-CM | POA: Diagnosis not present

## 2024-01-05 DIAGNOSIS — Z6837 Body mass index (BMI) 37.0-37.9, adult: Secondary | ICD-10-CM | POA: Diagnosis not present

## 2024-01-20 DIAGNOSIS — R202 Paresthesia of skin: Secondary | ICD-10-CM | POA: Diagnosis not present

## 2024-01-23 DIAGNOSIS — R202 Paresthesia of skin: Secondary | ICD-10-CM | POA: Diagnosis not present

## 2024-01-30 DIAGNOSIS — B001 Herpesviral vesicular dermatitis: Secondary | ICD-10-CM | POA: Diagnosis not present

## 2024-01-30 DIAGNOSIS — B009 Herpesviral infection, unspecified: Secondary | ICD-10-CM | POA: Diagnosis not present

## 2024-01-30 DIAGNOSIS — Z113 Encounter for screening for infections with a predominantly sexual mode of transmission: Secondary | ICD-10-CM | POA: Diagnosis not present

## 2024-02-01 DIAGNOSIS — R2990 Unspecified symptoms and signs involving the nervous system: Secondary | ICD-10-CM | POA: Diagnosis not present

## 2024-02-14 DIAGNOSIS — R202 Paresthesia of skin: Secondary | ICD-10-CM | POA: Diagnosis not present

## 2024-02-14 DIAGNOSIS — G603 Idiopathic progressive neuropathy: Secondary | ICD-10-CM | POA: Diagnosis not present

## 2024-02-16 DIAGNOSIS — Z713 Dietary counseling and surveillance: Secondary | ICD-10-CM | POA: Diagnosis not present

## 2024-02-16 DIAGNOSIS — Z6838 Body mass index (BMI) 38.0-38.9, adult: Secondary | ICD-10-CM | POA: Diagnosis not present

## 2024-02-16 DIAGNOSIS — E66812 Obesity, class 2: Secondary | ICD-10-CM | POA: Diagnosis not present

## 2024-02-16 DIAGNOSIS — E249 Cushing's syndrome, unspecified: Secondary | ICD-10-CM | POA: Diagnosis not present

## 2024-02-16 DIAGNOSIS — K760 Fatty (change of) liver, not elsewhere classified: Secondary | ICD-10-CM | POA: Diagnosis not present

## 2024-02-16 DIAGNOSIS — E669 Obesity, unspecified: Secondary | ICD-10-CM | POA: Diagnosis not present
